# Patient Record
Sex: Female | Born: 1955 | Race: White | Hispanic: No | Marital: Married | Smoking: Former smoker
Health system: Western US, Academic
[De-identification: ages and names within clinical notes are randomized; demographics above are authoritative.]

## PROBLEM LIST (undated history)

## (undated) DIAGNOSIS — M19012 Primary osteoarthritis, left shoulder: Secondary | ICD-10-CM

## (undated) DIAGNOSIS — M75100 Unspecified rotator cuff tear or rupture of unspecified shoulder, not specified as traumatic: Secondary | ICD-10-CM

## (undated) DIAGNOSIS — C44311 Basal cell carcinoma of skin of nose: Secondary | ICD-10-CM

## (undated) DIAGNOSIS — R7989 Other specified abnormal findings of blood chemistry: Principal | ICD-10-CM

## (undated) DIAGNOSIS — F3341 Major depressive disorder, recurrent, in partial remission: Secondary | ICD-10-CM

## (undated) DIAGNOSIS — J45909 Unspecified asthma, uncomplicated: Secondary | ICD-10-CM

## (undated) DIAGNOSIS — F32A Depression, unspecified: Secondary | ICD-10-CM

## (undated) DIAGNOSIS — E782 Mixed hyperlipidemia: Secondary | ICD-10-CM

## (undated) DIAGNOSIS — M8580 Other specified disorders of bone density and structure, unspecified site: Secondary | ICD-10-CM

## (undated) DIAGNOSIS — F329 Major depressive disorder, single episode, unspecified: Secondary | ICD-10-CM

## (undated) HISTORY — PX: PR UNLISTED PROCEDURE HANDS/FINGERS: 26989

## (undated) HISTORY — DX: Major depressive disorder, recurrent, in partial remission: F33.41

## (undated) HISTORY — DX: Primary osteoarthritis, left shoulder: M19.012

## (undated) HISTORY — DX: Basal cell carcinoma of skin of nose: C44.311

## (undated) HISTORY — PX: PR VAGINAL HYSTERECTOMY UTERUS 250 GM/<: 58260

## (undated) HISTORY — DX: Mixed hyperlipidemia: E78.2

## (undated) HISTORY — DX: Unspecified rotator cuff tear or rupture of unspecified shoulder, not specified as traumatic: M75.100

## (undated) HISTORY — DX: Unspecified asthma, uncomplicated: J45.909

## (undated) HISTORY — DX: Depression, unspecified: F32.A

## (undated) HISTORY — DX: Major depressive disorder, single episode, unspecified: F32.9

## (undated) HISTORY — PX: INJECTION, STEROID: SHX000882

## (undated) HISTORY — PX: TONSILLECTOMY: SHX001830

## (undated) HISTORY — DX: Other specified abnormal findings of blood chemistry: R79.89

## (undated) HISTORY — DX: Other specified disorders of bone density and structure, unspecified site: M85.80

---

## 2004-02-29 HISTORY — PX: PR COLONOSCOPY FLX DX W/COLLJ SPEC WHEN PFRMD: 45378

## 2015-02-04 ENCOUNTER — Ambulatory Visit: Admit: 2015-02-04 | Discharge: 2015-02-04 | Disposition: A | Discharge status: Home / Self Care | Payer: Self-pay

## 2015-12-02 ENCOUNTER — Ambulatory Visit: Admit: 2015-12-02 | Discharge: 2015-12-02 | Disposition: A | Discharge status: Home / Self Care | Payer: Self-pay

## 2016-02-01 DIAGNOSIS — G548 Other nerve root and plexus disorders: Secondary | ICD-10-CM

## 2016-02-01 DIAGNOSIS — G96191 Perineural cyst: Secondary | ICD-10-CM | POA: Insufficient documentation

## 2016-02-11 ENCOUNTER — Encounter: Payer: Self-pay | Admitting: ANESTHESIOLOGISTS

## 2016-02-11 ENCOUNTER — Ambulatory Visit (HOSPITAL_BASED_OUTPATIENT_CLINIC_OR_DEPARTMENT_OTHER): Payer: Enrolled Prime—HMO | Admitting: ANESTHESIOLOGISTS

## 2016-02-11 ENCOUNTER — Ambulatory Visit
Admission: RE | Admit: 2016-02-11 | Discharge: 2016-02-11 | Disposition: A | Discharge status: Home / Self Care | Payer: Enrolled Prime—HMO | Source: Ambulatory Visit | Attending: ANESTHESIOLOGISTS | Admitting: ANESTHESIOLOGISTS

## 2016-02-11 VITALS — BP 113/74 | HR 86 | Temp 98.3°F | Resp 16 | Ht 63.0 in | Wt 159.2 lb

## 2016-02-11 DIAGNOSIS — M48061 Spinal stenosis, lumbar region without neurogenic claudication: Secondary | ICD-10-CM | POA: Insufficient documentation

## 2016-02-11 DIAGNOSIS — M4316 Spondylolisthesis, lumbar region: Secondary | ICD-10-CM | POA: Insufficient documentation

## 2016-02-11 DIAGNOSIS — M533 Sacrococcygeal disorders, not elsewhere classified: Secondary | ICD-10-CM

## 2016-02-11 DIAGNOSIS — G4700 Insomnia, unspecified: Secondary | ICD-10-CM | POA: Insufficient documentation

## 2016-02-11 DIAGNOSIS — M5136 Other intervertebral disc degeneration, lumbar region: Secondary | ICD-10-CM

## 2016-02-11 DIAGNOSIS — M1811 Unilateral primary osteoarthritis of first carpometacarpal joint, right hand: Secondary | ICD-10-CM | POA: Insufficient documentation

## 2016-02-11 DIAGNOSIS — J45909 Unspecified asthma, uncomplicated: Secondary | ICD-10-CM | POA: Insufficient documentation

## 2016-02-11 DIAGNOSIS — M5416 Radiculopathy, lumbar region: Secondary | ICD-10-CM | POA: Insufficient documentation

## 2016-02-11 DIAGNOSIS — G43909 Migraine, unspecified, not intractable, without status migrainosus: Secondary | ICD-10-CM | POA: Insufficient documentation

## 2016-02-11 DIAGNOSIS — M1288 Other specific arthropathies, not elsewhere classified, other specified site: Secondary | ICD-10-CM

## 2016-02-11 DIAGNOSIS — E785 Hyperlipidemia, unspecified: Secondary | ICD-10-CM | POA: Insufficient documentation

## 2016-02-11 HISTORY — DX: Unspecified asthma, uncomplicated: J45.909

## 2016-02-11 NOTE — Patient Instructions (Signed)
PAIN MANAGEMENT CLINIC  AFTER VISIT INSTRUCTIONS       [facility] [phone] or [phone]: available during business hours, 8 am - 5 pm  On-call physician [phone] THIS IS A NUMERICAL PAGER -DO NOT [facility].  Physician is available after business hours, 7 days per week including holidays.      Today you attended a new patient consult or follow up appointment    Your next appointment to schedule is a procedure  VERY IMPORTANT  INSTRUCTIONS FOR YOUR FOLLOW UP APPOINTMENT  Please remember to arrive 15 minutes prior to your appointment time.  This will allow time for check in.  If you arrive after your scheduled appointment time your appointment could be rescheduled or your visit with the physician will be shortened.      NOTE:  If your Pain Physician ordered a procedure, lab, diagnostic study (x-ray, MRI, CT, ultrasound, EMG, EKG, etc.), or specialty referral for you today, please be aware that some insurance carriers may require authorization before they can be scheduled. If these cannot be scheduled at the time of discharge today, you will be contacted with the scheduled appointment.  If you are not contacted within 3-5 business days, please call our office at [phone].          Procedure To Schedule FOR YOUR Next Visit: Epidural Steroid - Caudal and Epidural Steroid -VS Lumbar  Fluoroscopy yes      PLEASE NOTIFY STAFF IF YOU HAVE A PACEMAKER, AICD, SPINAL CORD STIMULATOR OR OTHER IMPLANTABLE DEVICE BEFORE YOUR NEXT APPOINTMENT.      NOTE: Please be aware of which location you are scheduled for your next appointment. The  [facility] has 2 locations in [place]:   (1) the [facility] at the [facility] and   (2) the [place] at the [employer] [place].   If you have any questions regarding the location of  your next appointment, please call our office: [phone].            PLEASE FOLLOW ALL INSTRUCTIONS CAREFULLY  Instructions and medication holds may verify by procedure.  Please call to confirm the instructions specific to your procedure, [phone].        If you are SICK, it is not safe to do your procedure. Therefore, please call us as soon as possible to reschedule. Since we do keep a waiting list, your courtesy will allow [place] to schedule another patient. Please, call our office if you have any questions.    If you are NOT IN PAIN OR HAVE MINIMAL PAIN, please call to postpone your procedure (unless otherwise instructed by your Pain Physician).          MEDICATIONS    If your Pain Physician prescribes medications for you, please anticipate when you will require a refill.  It can take 1-3 business days to complete the process. Contact your pharmacy for medication refills. Your pharmacy will contact the Pain Clinic office with detailed medication information.         Driving When You Are Taking Medications    For most people, driving represents freedom, control and independence. Driving enables most people to get to the places they want or need to go. For many people, driving is important economically - some drive as part of their job or to get to and from work.   Driving is a complex skill. Our ability  to drive or operate heavy or dangerous machinery safely can be affected by changes in our physical, emotional and mental condition. The goal of this brochure is to help you and your health care professional talk about how your medications may affect your ability to drive safely.     How can medications affect my driving or ability to operate heavy or dangerous machinery?   People take medications for a variety of reasons. Those can include:          allergies          anxiety          colds          depression          diabetes          heart  and cholesterol conditions          high blood pressure          muscle spasms          pain          Parkinson's disease          schizophrenia   Medications may be prescribed by your doctor or purchased over-the-counter without a doctor's prescription. Many individuals also take herbal supplements. Some of these medications and supplements may cause a variety of reactions that may make it more difficult for you to drive a car or operate heavy or dangerous machinery safely. These reactions may include:          sleepiness          blurred vision          dizziness          slowed movement          fainting          inability to focus or pay attention          nausea   Often people take more than one medication at a time. The combination of different medications can cause problems for some people. This is especially true for older adults because they take more medications than any other age group. Due to changes in the body as people age, older adults are more prone to medication related problems. The more medications you take, the greater your risk that your medicines will affect your ability to drive safely. To help avoid problems, it is important that at least once a year you talk to your doctor or pharmacist about all the medications - both prescription and over-the-counter - you are taking. Also let your professional know what herbal supplements, if any, you are taking. Do this even if your medications and supplements are not currently causing you a problem.     Can I still drive or operate heavy or dangerous machinery safely if I am taking medications?     Yes, most people can drive or operate heavy or dangerous machinery safely if they are taking medications. It depends on the effect those medications - both prescription and over-the-counter - have on your coordination and mental function. In some cases you may not be aware of the effects. But, in many instances, your doctor can help to minimize the negative  impact of your medications on your driving or operate heavy or dangerous machinery in several ways. Your doctor may be able to:          adjust the dose;          adjust the timing of   doses or when you take the medication;          add an exercise or nutrition program to lessen the need for medication; and          change medication to one that causes less drowsiness.     What can I do if I am taking medications?   Talk to your doctor honestly.   When your doctor prescribes a medicine for you, ask about side effects. How should you expect the medicine to affect your ability to drive or operate heavy or dangerous machinery? Remind your doctor of other medications - both prescription and over-the-counter - and herbal supplements you are taking, especially if you see more than one doctor. Talking honestly with your doctor also means telling the doctor if you are not taking all or any of the prescribed medication. Do not stop taking your medication unless your doctor tells you to.   Ask your doctor if you should drive - especially when you first take a medication.   Taking a new medication can cause you to react in a number of ways. It is recommended that you do not drive or operate heavy or dangerous machinery when you first start taking a new medication until you know how that drug affects you. You also need to be aware that some over-the-counter medicines and herbal supplements can make it difficult for you to drive or operate heavy or dangerous machinery safely.     Talk to your pharmacist.   Get to know your pharmacist. Ask the pharmacist to go over your medications with you and to remind you of effects they may have on your ability to drive or operate heavy or dangerous machinery safely. Be sure to request printed information about the side effects of any new medication. Remind your pharmacist of other medicines and herbal supplements you are taking. Pharmacists are available to answer questions wherever you get  your medications. Many people buy medicines by mail. Mail-order pharmacies have a toll-free number you can call and a pharmacist available to answer your questions about medications.   Monitor yourself.   Learn to know how your body reacts to the medications and supplements. Keep track of how you feel after you take the medication. For example, do you feel sleepy? Is your vision blurry? Do you feel weak and slow? When do these things happen?   Let your doctor and pharmacist know what is happening.   No matter what your reaction is to taking a medicine - good or bad - tell your doctor and pharmacist. Both prescription and over-the-counter medications are powerful-that's why they work. Each person is unique. Two people may respond differently to the same medicine. If you are experiencing side effects, the doctor needs to know that in order to adjust your medication. Your doctor can help you find medications that work best for you.     What if I have to cut back or give up driving?   You can keep your independence even if you have to cut back or give up on your driving due to your need to take medications. It may take planning ahead on your part, but it will get you to the places you want to go and the people you want to see. Consider:          rides with family and friends;          taxi cabs;          shuttle buses   or vans;          public buses, trains and subways; and          walking.   Also, senior centers and religious and other local service groups often offer transportation services for older adults in the community.     Thank you for choosing [facility].

## 2016-02-11 NOTE — Progress Notes (Addendum)
[facility]  Department of Anesthesiology and Pain Medicine  [address], [address]  [place], [place] [address]  Phone: [phone]    Fax: [phone]    Date: February 11, 2016    Re: Erica Hancock  MR#: [id number]  Date of Birth: 10/19/1955  Age: 21yr    Requesting physician: Erica Messing, MD    Below is the summary of the visit with our mutual patient, Erica Hancock. Thank you for your cooperation in her care.      Assessment and Differential Diagnosis:    ICD-10-CM    1. Spondylolisthesis of lumbar region M43.16 L-SPINE 4+ VIEWS   2. Lumbar radicular pain M54.16    3. Sacro-iliac pain M53.3         Orders Placed This Encounter    L-Spine 4+ Views    Amitriptyline (ELAVIL) 50 mg Tablet             Encounter Summary:  Erica Hancock is a 60yr -old female who has a past medical history of depression and chronic axial back pain  and was seen today in consultation at the [facility] for low back pain.    Erica Hancock states that she has had axial back pain for 25 years. The pain in the low back is located primary in the right low back. Over the last 6-7 months, the patient has had radiating pain into the right lower extremity. She states the radiating pain is in the buttocks, radiating into the posterior thigh and into the lateral leg and achilles.     She had been involved in physical therapy 7-8 years ago. She was given a home exercise program which she has continued. She also has recently gotten involved in Yoga and home stretches which helps decrease the pain    In regards to pain medications  -Infrequent use of NSAIDs 800 mg 2 x per week  -Patient was prescribed Norco for her surgery- and does not want to take any opioids secondary to chronic constipation.   -Patient has not tried medications such as gabapentin or cymbalta. She would like to avoid any medications and is interested in more conservative there  -She has tried ICY-Hot for back pain which helps decrease the pain.  -She is also  noted to be on amitriptyline 50 mg at bedtime for depression, but Erica Hancock notes this also helps with her pain.     Of note, she has seen Dr. Efraim Kaufmann, an outside neurosurgeon, for possible surgery. We will need to obtain these records as well.      On physical examination, patient was noted to have 5/5 strength of the lower extremities bilaterally. Her reflexes were 2/2 of the patella and achilles, and had intact and symmetrical sensation to light touch bilaterally. She was noted to have positive Patrick's test and Gaenslen's test on the right, possibly indicating SIJ pathology as a secondary pain generator.     Our primary diagnosis today is Lumbar Radicular Pain. Our plan is to proceed with a Lumbar Epidural Steroid Injection vs a caudal. Although we have reports of the most recent MRI, we do not have the images available to [place]. We have asked the patient to obtain these images on a CD for [place], so that we may decide on the most appropriate procedure. It was noted on the MRI report that the patient had a Grade 1 L4 on L5 Spondylolistheses. We will have the patient obtain  a lumbar flexion/extension study, to rule out any significant instability. Lastly, as noted above, patient likely has a secondary pain generator from the right sacro iliac joint.. We will first proceed with a epidural steroid injection, and can consider a right sacro iliac joint injection if needed in the future.        RECOMMENDATIONS/TREATMENT PLAN:   1. Spondylolisthesis of lumbar region  - L-Spine 4+ Views- To rule out any instability of the Lumbar Spine    2. Lumbar radicular pain  -Next available LES vs Caudal. Dependent on MRI imaging which patient was instructed to bring on a disc so we may view the images ourselves    3. Sacro-iliac pain  -Likely a secondary pain generator based on physical exam. Will first recommend a epidural steroid injection, and can consider a right side SIJ in the future as needed.       DISPOSITION AFTER TODAY'S  VISIT: We will plan to have Erica Hancock follow up at the next available appointment for Caudal vs Lumbar Epidural Steroid Injection Depending on most recent MRI imaging which patient will bring              Dear Dr. Autumn Messing, MD    It was a pleasure to see your patient, Erica Hancock, today in consultation at the [facility] [place].  As you know, Erica Hancock is a 60yr -old female with a chief complaint of:    Chief Complaint   Patient presents with    Back Pain    Leg Pain       Erica Hancock is here for chronic complaint of right sided low back pain with radiation into the right lower extremity.     She notes pain located in the areas indicated in the pain location diagram below.     Pain Location Diagram        ALLERGIES:  Allergies   Allergen Reactions    Lidocaine Other-Reaction in Comments     Heart palpations    Sulfa (Sulfonamide Antibiotics) Anaphylaxis       CURRENT MEDICATIONS:     Outpatient Prescriptions Marked as Taking for the 02/11/16 encounter (Office Visit) with Amparo Bristol, MD   Medication Sig Dispense Refill    Amitriptyline (ELAVIL) 50 mg Tablet Take 50 mg by mouth every day at bedtime. Indications: Depression         STOPPED PAIN MEDICATIONS:   None    OPIOID MEDICATIONS  [name] was asked if they are taking opioid medications such as hydrocodone (Vicodin, Norco, Lortab, Zohydro), hydromorphone (Dilaudid, Exalgo), fentanyl (Duragesic, Actiq, Fentora, Subsys), morphine (MS Contin, Kadian, Avinza), oxycodone (Percocet, Roxycodone, Oxycontin), methadone, buprenorphine (Suboxone, Subutex, BuTrans), oxymorphone (Opana), codeine (Tylenol #3 or #4), tramadol (Ultram), or tapentadol (Nucynta)               [name] answers no to current opioid use.    TIMING OF PAIN  Erica Hancock states that the pain is present constantly (100% of the time)    PAIN QUALITY   Erica Hancock describes the pain as sharp,  dull, aching, throbbing and pressure    PAIN INTENSITY - Verbal Analog Scale (VAS): 0-10 Scale (0 = No Pain, 10 = Worst Imaginable Pain)    Currently, Erica Hancock has a VAS score of 5 / 10   On average, Erica Hancock pain score for the last week has been a VAS of 4 / 10   At its  best, Erica Hancock VAS pain score is 3 / 10   At its worst, Erica Hancock VAS pain score is 5 / 10     RELIEVING AND AGGRAVATING FACTORS  Erica Hancock was asked if any of the following activities either alleviates or exacerbates her pain: lying down, standing, sitting walking, exercise, medications, relaxation, thinking about something else, coughing/sneezing, urination, or bowel movements    Erica Hancock states her pain:   is relieved by: lying down, walking, medication     is aggravated by: standing    FUNCTIONAL ASSESSMENT (Pain Disability Index):   Measures the level of disability related to pain: 0-10 Scale (0= No Disability, 2=Minimal, 5=Moderate, 8=Severe, 10= Total Disability)    Family and home responsibilities: activities related to home and family 8   Recreation: hobbies sports and other leisure time activities 7   Social activity: participation with friends and acquaintances other than family members 7   Occupation: activities partly or directly related to working, including housework or volunteering 7   Sexual behavior: frequency and quality of sex life 7   Self-care: personal maintenance and independent daily living (bathing, dressing etc.) 8   Life-support activity: basic life-supporting behaviors (eating, sleeping, breathing, etc.) 6     REVIEW OF SYSTEMS:  Ms. Stjulian was asked to review the list below and report if she was currently experiencing any of the symptoms:     Constitutional  o fever or chills   o unplanned weight loss    Eyes  o double or blurred vision    ENT  o hearing loss   o difficulty swallowing    Hematologic/Lymphatic  o bleeding gums   o low platelet count    Endocrine  o heat intolerance   o cold intolerance    o thyroid problems    Integumentary  o skin rash    Respiratory  o shortness of breath   o wheezing    Cardiac  o palpitations (awareness of fast heart)   o chest pain    Gastrointestinal  o constipation   o abdominal pain   o nausea  o vomiting   o diarrhea    Genito-urinary  o sexual dysfunction   o urinary retention or difficulty urinating    Musculoskeletal  o back pain   o neck pain  o joint pain   o muscle pain    Neurological  o loss of consciousness or blackouts   o memory loss   o muscle weakness   o seizures   o trouble walking   o dizziness   o drowsiness    o excessive fatigue    Behavioral  o difficulty falling or remaining asleep   o loss of interest in hobbies or other activities   o difficulty concentrating   o feelings of guilt   o feeling depressed     Ms. Lamberton ADMITS TO THE FOLLOWING OF THE ABOVE LISTED SYMPTOMS:  cold intolerance and back pain    All other systems were negative.    The Review of Systems was reviewed.    PAST MEDICAL, FAMILY, SOCIAL HISTORY:     Family Life: Living Circumstances:    Erica Hancock currently lives with their spouse        Past Medical History:   Diagnosis Date    Depression        Past Surgical History:   Procedure Laterality Date    INJECTION, STEROID      Right thumb  TONSILLECTOMY         Family History   Problem Relation Age of Onset    Hypertension Mother     Heart Disease Mother     Blood Disease Father          PSYCHOSOCIAL HISTORY:     Social History     Social History    Marital status: MARRIED     Spouse name: N/A    Number of children: N/A    Years of education: N/A     Occupational History    self employed      Aeronautical engineer     Social History Main Topics    Smoking status: Former Smoker     Packs/day: 1.00     Years: 12.00     Types: Cigarettes     Quit date: 03/01/1979    Smokeless tobacco: Never Used    Alcohol use 3.6 oz/week     6 Glasses of wine per week    Drug use: No    Sexual activity: Not Asked     Other  Topics Concern    None     Social History Narrative    None     The patient  reports that she quit smoking about 36 years ago. Her smoking use included Cigarettes. She has a 12.00 pack-year smoking history. She has never used smokeless tobacco.    Ms. Writt past medical, family, and social history were reviewed.    PROBLEM LIST:  There is no problem list on file for this patient.      PAIN TREATMENTS:   Previous Injection Procedures: Ms. Bengtson did not undergo a recent procedure.Marland Kitchen NA    Prior Physical Therapy: The patient HAS been previously involved in physical therapy.  The patient states that physical therapy has been helpful.    PREVIOUS DIAGNOSTIC STUDIES:   Images: The report of the image indicated below was reviewed.   MRI of lumbar spine.         Labs: The lab/study results below were reviewed.          Urine Toxicology  No results found for: DRUGGCSCRNU  No results found for: AMPHETSCRNU  No results found for: BENZOSSCRNU  No results found for: COCAINESCRNU  No results found for: BARBSSCRNU    OTHER PAIN PROBLEMS:  The patient does NOT report pain in other areas.    LEGAL ISSUES:    Ms. Cavitt is not currently involved in litigation pertaining to the pain complaint.     She does not have a prior history of arrests or legal problems.     She has not filed a Clinical research associate related to the pain complaint.    PSYCHOLOGICAL HISTORY AND TREATMENTS:      Ms. Kaster has not been evaluated or treated by a psychiatrist, psychologist, or counselor.      DEPRESSION SCALE (Patient Health Questionnaire PHQ-9)  1. Little interest or pleasure in doing things: 1  2. Feeling down, depressed or hopeless: 1  3. Trouble falling or staying asleep, or sleeping too much: 0  4. Feeling tired or having little energy: 0  5. Poor appetite or overeating: 1  6. Feeling bad about yourself - or that you are a failure or have let yourself or your family down: 1  7. Trouble concentrating on things such as reading the  paper or watching television: 0  8. Moving or speaking so slowly that other people could have noticed?  Or the opposite - being so figity or restless that you have been moving around more than usual: 1  9. Thoughts that you would be better off dead or of hurting yourself in some way: 0  If >  1: Please notify a mental health provider or the patients primary care provider with any concerns about acute risk for suicide.   10. If you checked off any problems, how difficult have these problems made it for you to do your work, take care of things at home, or get along with other people?: Somewhat difficult  PHQ-9 Score: 5     PHQ9 Affective-Cognitive Total: 3/15  PHQ9 Physical Total: 2/12        Total Score Depression Severity   1-4 Minimal Depression   5-9 Mild Depression   10-14 Moderate Depression   15-19 Moderately Severe Depression   20-27 Severe Depression          She does not have a past history of suicide attempts.      Ms. Turay's psychological, depression, and suicidality history were reviewed.  The patient appears to be at low acute risk for suicide or self-harm at this time.     EFFECT OF PAIN ON EMPLOYMENT   Ms. Bowhay employment status (see below) has  been affected by the present pain condition.     She is not currently unemployed due to the painful condition.      PHYSICAL EXAM:    Vitals:    02/11/16 1428   BP: 113/74   Pulse: 86   Resp: 16   Temp: 36.8 C (98.3 F)   TempSrc: Oral   SpO2: 97%   Weight: 72.2 kg (159 lb 2.8 oz)   Height: 1.6 m (5\' 3" )     Body mass index is 28.2 kg/(m^2).   Constitutional: Normally developed, no deformities,well groomed.  Skin:  Skin color, texture, turgor normal. No rashes or lesions.  Eyes:  conjunctivae and sclera normal.  Ears:  external inspection of ears show no abnormality.  Nose:  normal.  Mouth: normal.  Respiratory: clear to auscultation.  Cardiovascular:  normal rate and regular rhythm, no murmurs, clicks, or gallops.  GI: soft,  non-tender.  Musculoskeletal: Gait/Station is not antalgic, Patient is able to heel walk and is able to toe walk  Neuro: Movement Assessment Lower limb  Bulk:  normal  Tone:   normal  Abnormal Movements:  no    Strength Movement Root Nerve Muscle   Bilateral 5 Hip flexion L1/2/3 Femoral and Spinal N. Iliopsoas   Bilateral 5 Hip adduction L2/3/4 Obturator Adductors   Bilateral 5 Knee flexion L5/S1/S2 Sciatic Hamstrings   Bilateral 5 Knee extension L2/3/4 Femoral Quadriceps   Bilateral 5 Ankle dorsiflexion L4/5 Deep peroneal Tibialis anterior   Bilateral 5 Ankle inversion L4/5 Tibial Nerve Tibialis Posterior   Bilateral 5 Ankle eversion L5/S1 Superficial peroneal Peronei   Bilateral 5 Ankle plantarflexion S1/S2 Tibial Gastrocnemius, soleus   Bilateral 5 Big toe extension L5/S1 Deep peroneal Extensor hallucis longus     Lower extremity reflexes: patella - bilateral 2+, achilles - bilateral 2+  Babinski- Toes are downgoing bilateral   Ankle clonus is not present bilateral    Psych:Appearance/Cooperation: in no apparent distress  Attitude: pleasant   Behavior :normal  Eye Contact: normal  Attention Span: good  Speech: normal volume, rate, and pitch  Mood (pt's report) :Mood pt's report, euthymic  Thought Process: logical and goal directed  Thought Content: no thought disorder or psychotic  symptoms  Lumbar Spine:range of motion is full with flexion and is associated with no change in pain.  range of motion is full with extension and is associated with increase in pain.  range of motion is mildly restricted with oblique extension and is associated with increase in pain.  Seated SLR is negative bilaterally.  Patrick's test causes pain over the SI joint right.  Gaenslen's causes pain over the SI joint right.    MEDICAL DECISION MAKING     Records Reviewed:   Images viewed/reviewed above were important and necessary because subsequent medical and treatment recommendations required review of the above image(s)  The report by  Dr. Renne Crigler dated 12/14/2015 was reviewed.  This was important and useful because the reviewed note clarified the reasons for the recommended treatment and the patient's history was corroborated in the reviewed note.     Review of the Risk of Comorbidities:  At this juncture, we believe that the patient's constitutional status adds no risk and complexity to our proposed evaluation and treatment.  Medium Risk procedures ordered:  Lumbar Injections     ASSESSMENT AND RECOMMENDATIONS/TREATMENT PLAN:   Please see the beginning of the note.       The patient was instructed and educated on all aspects of the plan of care.  The patient acknowledged the plan of care.  I saw and evaluated the patient with my attending physician, Dr. Carlynn Herald, DO  Fellow  Department of Anesthesiology & Pain Medicine    Note Electronically Signed By:  Harriett Rush, DO

## 2016-02-11 NOTE — Progress Notes (Signed)
Patient name and DOB verified. Patient roomed and vital signs obtained. Medication allergies and pain level verified. Tobacco use history reviewed.

## 2016-02-12 NOTE — Progress Notes (Signed)
Left message for patient to call back  

## 2016-02-14 NOTE — Progress Notes (Signed)
I (Aleksander Edmiston, MD) evaluated and examined the patient with the pain fellow on the date indicated in the fellow's note.  I discussed the case with the Fellow, Dr. Dyara and agree with the findings.  We developed the plan together as outlined in the pain fellow's note.       The patient was instructed and educated on all aspects of the plan of care. The patient acknowledged the plan of care.    Apurva Reily Gregory Calen Geister, MD  Attending Physician  Department of Anesthesiology & Pain Medicine    Note Electronically Signed By:  Jabier Deese G. Aniko Finnigan, MD  Professor of Anesthesiology and Pain Medicine  Division of Pain Medicine

## 2016-02-15 ENCOUNTER — Telehealth: Payer: Self-pay

## 2016-02-15 NOTE — Progress Notes (Signed)
lmcb with pcp office to submit auth req to [employer]

## 2016-02-15 NOTE — Telephone Encounter (Signed)
LMTCB regarding flexion/extension studies.

## 2016-03-01 NOTE — Telephone Encounter (Signed)
MRI lumbar spine report scanned in under media dated today 03/01/16. Also imaging is uploaded into outside images.    Thank you  [facility] Patient Referrals  [place] [facility]  [phone]  [phone]

## 2016-03-03 ENCOUNTER — Encounter: Payer: Self-pay | Admitting: ANESTHESIOLOGISTS

## 2016-03-23 ENCOUNTER — Ambulatory Visit: Admit: 2016-03-23 | Discharge: 2016-03-23 | Disposition: A | Discharge status: Home / Self Care | Payer: Self-pay

## 2016-03-23 ENCOUNTER — Other Ambulatory Visit: Payer: Self-pay | Admitting: Family Medicine

## 2016-03-23 ENCOUNTER — Ambulatory Visit
Admission: RE | Admit: 2016-03-23 | Discharge: 2016-03-23 | Disposition: A | Discharge status: Home / Self Care | Source: Ambulatory Visit | Attending: Family Medicine | Admitting: Family Medicine

## 2016-03-23 DIAGNOSIS — Z1231 Encounter for screening mammogram for malignant neoplasm of breast: Principal | ICD-10-CM

## 2016-03-27 ENCOUNTER — Encounter: Payer: Self-pay | Admitting: Physician Assistant

## 2016-03-27 ENCOUNTER — Ambulatory Visit: Admit: 2016-03-27 | Discharge: 2016-04-01 | Disposition: A | Discharge status: Home / Self Care | Payer: Self-pay

## 2016-03-27 ENCOUNTER — Emergency Department

## 2016-03-27 ENCOUNTER — Ambulatory Visit: Admit: 2016-03-27 | Discharge: 2016-03-27 | Disposition: A | Discharge status: Home / Self Care | Payer: Self-pay

## 2016-03-27 ENCOUNTER — Emergency Department
Admission: RE | Admit: 2016-03-27 | Discharge: 2016-03-27 | Disposition: A | Discharge status: Home / Self Care | Attending: Physician Assistant | Admitting: Physician Assistant

## 2016-03-27 DIAGNOSIS — Z87891 Personal history of nicotine dependence: Secondary | ICD-10-CM

## 2016-03-27 DIAGNOSIS — F329 Major depressive disorder, single episode, unspecified: Secondary | ICD-10-CM

## 2016-03-27 DIAGNOSIS — M7532 Calcific tendinitis of left shoulder: Secondary | ICD-10-CM | POA: Insufficient documentation

## 2016-03-27 DIAGNOSIS — M25512 Pain in left shoulder: Secondary | ICD-10-CM

## 2016-03-27 MED ORDER — HYDROCODONE 5 MG-ACETAMINOPHEN 325 MG TABLET
1.0000 | ORAL_TABLET | Freq: Four times a day (QID) | ORAL | 0 refills | Status: AC | PRN
Start: 2016-03-27 — End: 2016-03-30

## 2016-03-27 MED ORDER — MELOXICAM 7.5 MG TABLET
7.5000 mg | ORAL_TABLET | Freq: Every day | ORAL | 0 refills | Status: DC
Start: 2016-03-27 — End: 2016-04-12

## 2016-03-27 MED ORDER — KETOROLAC 30 MG/ML (1 ML) INJECTION SOLUTION
30.0000 mg | Freq: Once | INTRAMUSCULAR | Status: AC
Start: 2016-03-27 — End: 2016-03-27
  Administered 2016-03-27: 30 mg via INTRAMUSCULAR
  Filled 2016-03-27: qty 1

## 2016-03-27 NOTE — ED Initial Note (Signed)
EMERGENCY DEPARTMENT PHYSICIAN NOTE - Erica Hancock       Date of Service:   03/27/2016 12:28 PM Patient's PCP: Rhea Belton Camisa   Note Started: 03/27/2016 13:42 DOB: 07-21-1955           No chief complaint on file.          The history provided by the patient.  Interpreter used: No    Erica Hancock is a 61yr old female, with a past medical history significant for previous L Frozen shoulder and injury 5 years ago, who presents to the ED with a chief complaint of L shoulder pain that began yesterday upon waking. Pt with sig dec ROM.     Quality: sharp  Location: L shoulder  Severity: mod-severe/10  Time Course: constant  Progression: unchanged  Duration: 2 days  Palliative factors: nothing makes it better.  Provocative factors: nothing makes it worse.  Associated symptoms: none  Pertinent negatives: no n/t/weakness    A full history, including pertinent past medical and social history was reviewed and updated as necessary.Marland Kitchen    HISTORY:  There are no active hospital problems to display for this patient.   Allergies   Allergen Reactions    Epinephrine Tachycardia    Lidocaine Other-Reaction in Comments     Heart palpations    Sulfa (Sulfonamide Antibiotics) Anaphylaxis      Past Medical History:  No date: Depression Past Surgical History:  No date: Injection, steroid  No date: Tonsillectomy   Social History    Marital status: MARRIED             Spouse name:                       Years of education:                 Number of children:               Occupational History  Occupation          Fish farm manager            Comment               self employed                           Sales promotion account executive    Social History Main Topics    Smoking status: Former Smoker                                                                Packs/day: 1.00      Years: 12.00          Types: Cigarettes       Quit date: 03/01/1979    Smokeless status: Never Used                        Alcohol  use: Yes  3.6 oz/week       6 Glasses of wine per week    Drug use: No              Sexual activity: Not on file          Other Topics            Concern    None on file    Social History Narrative    None on file     Review of patient's family history indicates:    Hypertension                   Mother                    Heart Disease                  Mother                    Blood Disease                  Father                             Review of Systems   Constitutional: Negative for activity change, chills, fatigue, fever and unexpected weight change.   HENT: Negative for congestion, dental problem, ear pain, facial swelling, mouth sores, postnasal drip, rhinorrhea, sinus pressure, sore throat and trouble swallowing.    Eyes: Negative for photophobia, pain, discharge, redness and visual disturbance.   Respiratory: Negative for cough, shortness of breath and wheezing.    Cardiovascular: Negative for chest pain, palpitations and leg swelling.   Gastrointestinal: Negative for abdominal pain, diarrhea, nausea and vomiting.   Endocrine: Negative for polydipsia and polyuria.   Genitourinary: Negative for decreased urine volume, dysuria, flank pain and hematuria.   Musculoskeletal: Positive for arthralgias. Negative for back pain, joint swelling, myalgias and neck pain.   Skin: Negative for rash.   Allergic/Immunologic: Negative for environmental allergies and food allergies.   Neurological: Negative for light-headedness, numbness and headaches.   Hematological: Negative for adenopathy.   Psychiatric/Behavioral: Negative for behavioral problems, confusion, hallucinations and self-injury. The patient is not nervous/anxious.        TRIAGE VITAL SIGNS:  Temp: 36.6 C (97.8 F) (03/27/16 1226)  Temp src: Temporal (03/27/16 1226)  Pulse: 76 (03/27/16 1226)  BP: 142/82 (03/27/16 1226)  Resp: 18 (03/27/16 1226)  SpO2: 95 % (03/27/16 1226)  Weight: 70.3 kg (155 lb) (03/27/16 1226)    Physical Exam    Constitutional: She is oriented to person, place, and time. She appears well-developed and well-nourished.   HENT:   Head: Normocephalic and atraumatic.   Right Ear: External ear normal.   Left Ear: External ear normal.   Nose: Nose normal.   Mouth/Throat: Oropharynx is clear and moist. No oropharyngeal exudate.   Eyes: Conjunctivae and EOM are normal. Pupils are equal, round, and reactive to light. Left eye exhibits no discharge.   Neck: Normal range of motion. Neck supple.   Cardiovascular: Normal rate, regular rhythm, normal heart sounds and intact distal pulses.    Pulmonary/Chest: Effort normal and breath sounds normal. No respiratory distress. She has no wheezes.   Abdominal: Soft. Bowel sounds are normal. There is no tenderness.   Musculoskeletal: She exhibits tenderness (L lat shoulder). She exhibits no edema.        Right shoulder: She  exhibits normal range of motion, no tenderness, no bony tenderness, no swelling, no effusion, no crepitus, no deformity, no laceration, no pain, no spasm, normal pulse and normal strength.        Left shoulder: She exhibits decreased range of motion, tenderness, bony tenderness and pain. She exhibits no swelling, no effusion, no crepitus, no deformity, no laceration, no spasm, normal pulse and normal strength.   Neurological: She is alert and oriented to person, place, and time. She has normal reflexes.   Skin: Skin is warm and dry.   Psychiatric: She has a normal mood and affect. Her behavior is normal. Judgment and thought content normal.   Nursing note and vitals reviewed.        INITIAL ASSESSMENT & PLAN, MEDICAL DECISION MAKING, ED COURSE  Erica Hancock is a 87yr female who presents with a chief complaint of L shoulder pain. Pt awoke with pain yest am. She had injury 5 years ago with subsequent Frozen Shoulder. Pt denies recent injury.     Differential includes, but is not limited to: This differential diagnosis is broad and complex, and includes, but is not limited  to septic arthritis, rheumatoid arthritis, osteoarthritis, gout, pseudogout, lyme disease, gonococcal arthritis, avascular necrosis.      The results of the ED evaluation were notable for the following:    Pertinent imaging results (reviewed and interpreted independently by me): Shoulder 3+ Views Left    Result Date: 03/27/2016  AP in internal and external rotation and transscapular Y views of left shoulder:    Indication: Left shoulder pain without recent trauma history. Comparison: None. Findings: There is no evidence for left shoulder fracture or malalignment. Moderate left acromioclavicular osteoarthritis is present, without significant glenohumeral osteoarthritis. There is a curvilinear 2 cm amorphous calcific density overlying the humeral greater tuberosity, compatible with calcific tendinitis in the rotator cuff. Remainder of left shoulder evaluation is normal.     Impression: 1. Positive for calcific tendinitis in the left rotator cuff. 2. Moderate left acromioclavicular osteoarthritis. 3. Negative for fracture or malalignment.        Chart Review: I reviewed the patient's prior medical records. Pertinent information that is relevant to this encounter reviewed.      Patient Summary: Pt with L shoulder pain, atraumatic. She is found to have Rotator Cuff Calcific Tendinitis. She will f/u with her ortho tomorrow as scheduled. She was given Toradol in the Ed and placed on Meloxicam for pain prn.      LAST VITAL SIGNS:  Temp: 36.6 C (97.8 F) (03/27/16 1226)  Temp src: Temporal (03/27/16 1226)  Pulse: 76 (03/27/16 1226)  BP: 142/82 (03/27/16 1226)  Resp: 18 (03/27/16 1226)  SpO2: 95 % (03/27/16 1226)  Weight: 70.3 kg (155 lb) (03/27/16 1226)      Clinical Impression:     ICD-10-CM    1. Calcific tendinitis of left shoulder M75.32    2. Pain in joint of left shoulder M25.512                  Disposition: Discharge. Follow up with PCP or Hand Surgeon. ED discharge instructions were reviewed and  provided.      PATIENT'S GENERAL CONDITION:  Good: Vital signs are stable and within normal limits. Patient is conscious and comfortable. Indicators are excellent.     Electronically signed by: Rodney Booze

## 2016-03-27 NOTE — ED Nursing Note (Addendum)
Ice pack given to pt for left  shoulder pain

## 2016-03-27 NOTE — Discharge Instructions (Signed)
Thank you for choosing [facility] for your emergency health care needs. It has been our privilege to take care of you today. You have been seen for L shoulder pain and Calcific Tendinitis and discharged home.     Please take all medicines that are prescribed to you as directed. It is crucial, if you have a primary care physician, to follow up with him or her in the time frame recommended as many health conditions that seem self-limited initially may actually worsen over time. If you do not have a primary care physician, we will outline the various resources available for you to find one.     If at any time you feel that your condition is worsening, call your doctor or return to the emergency department for reevaluation.     Please realize that the results of some studies that you had done during your stay with [place] (such as xrays and cultures) are only preliminarily resulted. Results of these studies may change as more information becomes available or as the studies are reevaluated by other members of our health care team in the next few days. We will attempt to contact you with any important changes or additions to the studies that were obtained today.     MEDICATIONS   1. Resume all home medications as directed.   2. Start new discharge medications as directed.   3. Do not drive or operate machinery while taking narcotic pain medications.   4. You may have been prescribed a pain medicine(s) that contains Tylenol (acetaminophen). If you are taking other Tylenol containing medicines at home, be sure NOT to exceed 4 grams (4000 milligrams) of Tylenol per day.

## 2016-03-27 NOTE — ED Triage Note (Addendum)
Left shoulder pain started yesterday morning when she woke up; no recent trauma, however, injured it 5 years ago, painful on movement, has sling applied, took Ibuprofen 600 mg and 1 Vicodin at 1000 today

## 2016-03-29 ENCOUNTER — Telehealth: Payer: Self-pay | Admitting: Family Medicine

## 2016-03-29 NOTE — Telephone Encounter (Signed)
Patient calling in states that she was seen at [facility] ER 03/28/15.   She states she is in acute pain and can not wait until 04/11/2016 to be seen.    She is asking to be seen as soon as possible to address her shoulder pain.   She had a shoulder xray at [facility]. She would like to move forward with an MRI due to the severity of her pain she doesn't believe it is just calcification and tendonitis.   Please review records and advise if we can see patient sooner or start referral process / place MRI order.

## 2016-03-31 NOTE — Telephone Encounter (Signed)
Spoke with patient, unable to come in on 04/05/16, will keep her appointment on 04/11/16 will call office if symptoms worsen.

## 2016-03-31 NOTE — Telephone Encounter (Signed)
Next available slot would be Tuesday 2/6 at 9:40am.  Please check with patient if available.  Thank you.

## 2016-03-31 NOTE — Telephone Encounter (Signed)
Thank you for the message.  Please request records from [facility] ER for my review.  Will assess the situation once available.

## 2016-03-31 NOTE — Telephone Encounter (Signed)
Records and x-ray available for review, see encounter for 03/27/16. Next 27min slot is 04/08/16, please advise.

## 2016-04-11 ENCOUNTER — Ambulatory Visit: Admitting: Family Medicine

## 2016-04-11 ENCOUNTER — Encounter: Payer: Self-pay | Admitting: Family Medicine

## 2016-04-11 VITALS — BP 100/65 | HR 84 | Temp 98.6°F | Resp 16 | Ht 64.0 in | Wt 161.6 lb

## 2016-04-11 DIAGNOSIS — M7532 Calcific tendinitis of left shoulder: Secondary | ICD-10-CM

## 2016-04-11 DIAGNOSIS — M8580 Other specified disorders of bone density and structure, unspecified site: Secondary | ICD-10-CM

## 2016-04-11 DIAGNOSIS — C44311 Basal cell carcinoma of skin of nose: Secondary | ICD-10-CM

## 2016-04-11 DIAGNOSIS — M25512 Pain in left shoulder: Principal | ICD-10-CM

## 2016-04-11 DIAGNOSIS — J45909 Unspecified asthma, uncomplicated: Secondary | ICD-10-CM

## 2016-04-11 DIAGNOSIS — C44319 Basal cell carcinoma of skin of other parts of face: Secondary | ICD-10-CM

## 2016-04-11 DIAGNOSIS — G8929 Other chronic pain: Principal | ICD-10-CM

## 2016-04-11 DIAGNOSIS — F3341 Major depressive disorder, recurrent, in partial remission: Secondary | ICD-10-CM

## 2016-04-11 DIAGNOSIS — Z0000 Encounter for general adult medical examination without abnormal findings: Secondary | ICD-10-CM

## 2016-04-11 DIAGNOSIS — M9983 Other biomechanical lesions of lumbar region: Secondary | ICD-10-CM

## 2016-04-11 DIAGNOSIS — M48061 Spinal stenosis, lumbar region without neurogenic claudication: Secondary | ICD-10-CM

## 2016-04-11 DIAGNOSIS — M4316 Spondylolisthesis, lumbar region: Secondary | ICD-10-CM

## 2016-04-11 NOTE — Nursing Note (Signed)
Patient vitals checked, allergies verified, pharmacy verified and patient screened for pain. By Eathan Groman, MAII

## 2016-04-11 NOTE — Progress Notes (Signed)
SUBJECTIVE:  Erica Hancock is a 61yr old female who is new to Erica [facility]ancock  PCP: Harley Alto, DO    Chief Complaint   Patient presents with    Establish Care    Shoulder Problem     left shoulder, requesting MRI    Sinus Problem      61yr female here to establish care.  Previous PCP at [facility].    Chronic left shoulder pain for past 7 years.  Excruciating pain last few weeks.  Several shots given through [facility] in the past. Diagnosed with calcific tendinitis.    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  Not work-related.  No specific injury.  On-and-off symptoms for years.  X-rays performed.  Requesting further evaluation.  Requesting refill on Mobic.  Improvement in low back pain as well.    Diagnosed with advanced lumbar spinal stenosis.  Injury to neck and upper back as a youth.  Happened during physical education landing on head / neck.  Massages several times per month.  No acupuncture tried.  Worsened with chiropractics in the past.  Established with pain specialist.    History of basal cell carcinoma including 3 Mohs surgeries - one on forehead and two on nose.  Recent significant procedure 1 week ago with twelve sutures placed - site the size of thumbnail.    Diagnosis of osteopenia on DEXA. Currently on Calcium and Vitamin D.  Strong family history of osteoporosis.  History of right trapezium repair in October 2017.  Traumatic injury.  Failed conservative therapy and physical therapy for suspected strain.    Asthma.  Chronic sinus symptoms.  Occasional wheezing.  States she has chronic breathing problems.  Grew up in a room with a cooler which may have had black mold.  Both parents quit smoking in 70s.  Lots of secondhand smoke exposure.  Requesting further evaluation.    Major depression, stable on Elavil.    No suicidal or homicidal ideation.    Due for screening labs.        Social History   Substance Use Topics    Smoking  status: Former Smoker     Packs/day: 1.00     Years: 12.00     Types: Cigarettes     Quit date: 03/01/1979    Smokeless tobacco: Never Used    Alcohol use 3.6 oz/week     6 Glasses of wine per week       Patient Active Problem List   Diagnosis    Migraines    Insomnia    Depression    Arthritis of carpometacarpal (CMC) joint of right thumb    Asthma    HLD (hyperlipidemia)    Skin cancer    Spondylolisthesis of lumbar region    Foraminal stenosis of lumbar region    Lumbar radicular pain    Calcific tendinitis of left shoulder    Pain in joint of left shoulder     Current Outpatient Prescriptions on File Prior to Visit   Medication Sig Dispense Refill    Amitriptyline (ELAVIL) 50 mg Tablet Take 50 mg by mouth every day at bedtime. Indications: Depression      BECLOMETHASONE DIPROPIONATE (QVAR INHA) 2 times daily.      ESTRACE 0.01 % (0.1 mg/gram) Vaginal Cream Insert into the vagina three times a week.      IBUPROFEN (MOTRIN PO) Take 600 mg by mouth if needed.  MISC ELECTROLYTE REPLACEMENT (CALCIUM, [place]++,)       MULTIVITAMIN WITH MINERALS (MULTIVITAMIN & MINERAL FORMULA PO) every day.       No current facility-administered medications on file prior to visit.      BP 100/65  Pulse 84  Temp 37 C (98.6 F) (Temporal)  Resp 16  Ht 1.626 m (5\' 4" )  Wt 73.3 kg (161 lb 9.6 oz)  BMI 27.74 kg/m2    OBJECTIVE:  GENERAL: alert and oriented, well developed, well nourished, pleasant 61 year old female, no acute distress  ENT: moist mucous membranes; nasal mucosa normal; oropharynx clear without inflammation or exudate  HEART: regular rate and rhythm   LUNGS: clear to auscultation bilaterally  ABDOMEN: soft, non-tender, non-distended, normoactive bowel sounds  MUSCULOSKELETAL: moves all extremities, capillary refill <3 seconds, no edema, cyanosis, clubbing  Left shoulder exam shows left side positive impingement signs are present with pain at high arc of abduction.  Internal rotation is painful and  limited.  Empty beercan test positive on the left. Exam for unaffected side is normal.  Negative instability check.   Neurology: DTR's intact.  Vascular: normal circulation to distal extremity. Normal capillary refill.      ASSESSMENT AND PLAN AS DISCUSSED WITH THE PATIENT:      ICD-10-CM    1. Chronic left shoulder pain M25.512 MR UPPER EXTREMITY JOINT WO CONTRAST    G89.29 Meloxicam (MOBIC) 7.5 mg tablet   2. Calcific tendinitis of left shoulder M75.32    3. Spondylolisthesis of lumbar region M43.16    4. Foraminal stenosis of lumbar region M99.83    5. Basal cell carcinoma of skin of nose C44.311    6. Basal cell carcinoma of skin of other part of face C44.319    7. Osteopenia, unspecified location M85.80    8. Asthma, unspecified asthma severity, unspecified whether complicated, unspecified whether persistent J45.909 PULMONARY FUNCTION TEST COMPLETE  (PFT) - PULMONARY LAB     DX CHEST 2 VIEWS   9. Recurrent major depressive disorder, in partial remission F33.41    10. Routine health maintenance Z00.00 CBC WITH DIFFERENTIAL     COMPREHENSIVE METABOLIC PANEL     LIPID PANEL WITH DLDL REFLEX     TSH WITH FREE T4 REFLEX     HEMOGLOBIN A1C     See orders above.  Continue current medication regimen.  Continue Calcium and Vit D.  Chronic left shoulder pain.  Recent diagnosis of calcific tendinitis but concern for rotator cuff tendinitis vs tear.  MRI ordered.  Follow-up with results.  Consider physical therapy vs ortho/sports med referral.  Rx Meloxicam given.  Warned.  Discussed supportive care, ice / heat, home exercises, analgesia as needed.   Chest x-ray and PFTs for further evaluation of asthma.  Routine screening labs ordered.  Follow-up with results.  Will review previous records once available and perform further health maintenance as indicated.    Health Maintenance reviewed - see above.Marland Kitchen  Health Maintenance   Topic Date Due    Tdap  04/15/1965    HIV SCREENING  12/14/1966    PNEUMOCOCCAL VACCINE (1)  12/14/1971    CERVICAL HPV TEST (WITH NEXT PAP)  12/14/1983    PAP-GYN CYTOLOGY  12/14/1983    HEPATITIS C SCREENING  12/14/1983    COLONOSCOPY  12/14/2003    ZOSTER  12/13/2013    INFLUENZA  09/29/2015    MAMMOGRAM  03/23/2018         Discussed care and warning signs with  Ajana and all questions and concerns were fully answered. She will call or followup in the office if any problems arise.     I have reviewed the patient's medical history in detail and updated the computerized patient record.    Barriers to Learning assessed: none.   Patient verbalizes understanding of teaching and instructions.    The patient and/or caregiver was educated regarding his/her medical care.   No guarantees were made regarding his/her medical care or treatment outcome.    I reviewed past medical, family/social and medication history during the visit.     This note was created using the support of [employer] and/or Pharmacist, community. Please note any grammatical, sound-alike, or syntax errors as likely dictation errors.      Electronically signed by:    Otis Peak, DO  Board-Certified, American Board of Family Medicine  Associate Physician in [place]

## 2016-04-12 MED ORDER — MELOXICAM 7.5 MG TABLET
7.5000 mg | ORAL_TABLET | Freq: Every day | ORAL | 3 refills | Status: DC
Start: 2016-04-12 — End: 2016-07-11

## 2016-04-13 ENCOUNTER — Telehealth: Payer: Self-pay | Admitting: Family Medicine

## 2016-04-13 ENCOUNTER — Telehealth: Payer: Self-pay | Admitting: ANESTHESIOLOGISTS

## 2016-04-13 NOTE — Telephone Encounter (Signed)
Yes, Okay to keep her appointment on 04/20/16.

## 2016-04-13 NOTE — Telephone Encounter (Signed)
Dr. 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 Basal cell carcinoma removed 04/05/16 the size of her thumb. Vertical stiches already removed, horizontal stiches will be removed 04/19/16. Will be off her antibiotics for 7 days prior to her appointment (last dose tonight 2/14). Okay to keep her appointment on 04/20/16?     Message reviewed, determined further review/action is needed from Physician.

## 2016-04-13 NOTE — Telephone Encounter (Signed)
Spoke to patient advise her she can keep her appointment for 04/20/16.

## 2016-04-13 NOTE — Telephone Encounter (Signed)
Please call patient and have patient call the radiology film [place] at [phone]. Thank you

## 2016-04-13 NOTE — Telephone Encounter (Signed)
Patient is calling to request her CD of her spine back.  She will be going downtown to pain clinic on 2/21 for an injection.  She can be reached at the above number.  Thank you.

## 2016-04-13 NOTE — Telephone Encounter (Signed)
Dear  Dr. Luis Abed:    [name] was identified by name, date of birth, and mailing address.    The caller is (relationship): self    Phone number (Company Name/Office): [phone]    Pharmacy (location selected): n/a    Reason for Call: Patient was last seen 02/11/16 with Dr. Luis Abed. 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 Patient is completing her last antibiotic of doxycycline tonight. Per patient she had a big chunk of her nose taken out. Is it okay to still keep the appt for 04/20/16?    Action Requested: Please advise    Thank you,    Note Electronically Signed By:  Adelene Amas

## 2016-04-14 ENCOUNTER — Encounter: Payer: Self-pay | Admitting: Family Medicine

## 2016-04-14 DIAGNOSIS — C44311 Basal cell carcinoma of skin of nose: Secondary | ICD-10-CM | POA: Insufficient documentation

## 2016-04-14 DIAGNOSIS — M8580 Other specified disorders of bone density and structure, unspecified site: Secondary | ICD-10-CM | POA: Insufficient documentation

## 2016-04-14 DIAGNOSIS — F3341 Major depressive disorder, recurrent, in partial remission: Secondary | ICD-10-CM | POA: Insufficient documentation

## 2016-04-14 DIAGNOSIS — C4491 Basal cell carcinoma of skin, unspecified: Secondary | ICD-10-CM | POA: Insufficient documentation

## 2016-04-14 HISTORY — DX: Major depressive disorder, recurrent, in partial remission: F33.41

## 2016-04-14 HISTORY — DX: Other specified disorders of bone density and structure, unspecified site: M85.80

## 2016-04-14 HISTORY — DX: Basal cell carcinoma of skin of nose: C44.311

## 2016-04-15 ENCOUNTER — Other Ambulatory Visit: Payer: Self-pay

## 2016-04-19 NOTE — Telephone Encounter (Signed)
Patient came in asking for her Cd,s. Patient was verified and given back 3 Cd's patient, Patient stated she had all her CD's back and that all looked correct

## 2016-04-20 ENCOUNTER — Encounter: Payer: Self-pay | Admitting: ANESTHESIOLOGISTS

## 2016-04-20 ENCOUNTER — Ambulatory Visit: Attending: ANESTHESIOLOGISTS | Admitting: ANESTHESIOLOGISTS

## 2016-04-20 VITALS — BP 141/84 | HR 84 | Temp 98.2°F | Resp 16 | Ht 64.0 in | Wt 159.2 lb

## 2016-04-20 DIAGNOSIS — M5416 Radiculopathy, lumbar region: Principal | ICD-10-CM | POA: Insufficient documentation

## 2016-04-20 DIAGNOSIS — M4316 Spondylolisthesis, lumbar region: Secondary | ICD-10-CM | POA: Insufficient documentation

## 2016-04-20 DIAGNOSIS — M48061 Spinal stenosis, lumbar region without neurogenic claudication: Secondary | ICD-10-CM

## 2016-04-20 DIAGNOSIS — M9983 Other biomechanical lesions of lumbar region: Secondary | ICD-10-CM | POA: Insufficient documentation

## 2016-04-20 MED FILL — sodium chloride 0.9 % injection solution: INTRAMUSCULAR | Qty: 10 | Status: AC

## 2016-04-20 MED FILL — dexAMETHasone sodium phosphate (PF) 10 mg/mL injection solution: INTRAMUSCULAR | Qty: 1 | Status: AC

## 2016-04-20 NOTE — Patient Instructions (Signed)
PAIN MANAGEMENT CLINIC  AFTER VISIT INSTRUCTIONS    PLEASE NOTIFY STAFF IF YOU HAVE A PACEMAKER, AICD, SPINAL CORD STIMULATOR OR OTHER IMPLANTABLE DEVICE BEFORE YOUR NEXT APPOINTMENT.      Pain Clinic [phone] or [phone]: available during business hours, 8 am - 5 pm  On-call physician [phone] THIS IS A NUMERICAL PAGER -DO NOT LEAVE VOICE MESSAGE.  Physician is available after business hours, 7 days per week including holidays.        Procedure Performed Today:     Epidural Steroid - Caudal  Fluoroscopy yes    If you experience any of the following symptoms after your procedure, please notify our office or on-call physician immediately (see above for contact information):   fever (increased oral temperature)   bleeding or swelling at the injection site,    drainage, rash or redness at the injection site    possible signs of infection    increased pain at the injection site   worsening of your usual pain   severe headache   new or worsening numbness    new arm and/or leg weakness, or    changes in bowel and/or bladder function: urinating or defecating on yourself and not knowing that you did it.      PLEASE FOLLOW ALL INSTRUCTIONS CAREFULLY     Do not drive or operate heavy machinery for 12 hours.   Do not engage in strenuous activity (e.g., lifting or pushing heavy objects or repeated bending) for 24 hours.     Do not take a bath, swim or use Jacuzzi for 24 hours after procedure. (A shower is fine).   Remove any Band-Aids when you get home.    Use cold/ice, as needed for comfort.  We recommend the use of cold therapy alternating on for 20 minutes, off for 20 minutes.    Do not apply direct heat (heating pad or heat packs) to the injection site for 24 hours.     Resume your usual medications, unless instructed otherwise by your Pain Physician.     If you are on warfarin (Coumadin) or other blood thinner, resume this  medication as instructed by your prescribing Physician.    Keep a record of your response to the injection you had today.     How much relief did you get?   Were you able to decrease the use of any of your pain medications?   Were you able to increase your level of activity?   How long did the relief last?     NOTE:  If your Pain Physician ordered a procedure, lab, diagnostic study (x-ray, MRI, CT, ultrasound, EMG, EKG, etc.), or specialty referral for you today, please be aware that some insurance carriers may require authorization before they can be scheduled. If these cannot be scheduled at the time of discharge today, you will be contacted with the scheduled appointment.  If you are not contacted within 3-5 business days, please call our office at [phone].          Procedure To Schedule FOR YOUR Next Visit: Epidural Steroid - Caudal  Fluoroscopy yes  VERY IMPORTANT  INSTRUCTIONS FOR YOUR FOLLOW UP APPOINTMENT  Please remember to arrive 15 minutes prior to your appointment time.  This will allow time for check in.  If you arrive after your scheduled appointment time your appointment could be rescheduled or your visit with the physician will be shortened.        NOTE: Please be aware of which location you are scheduled for your next appointment. The [facility] has 2 locations in [place]: (1) the  Ambulatory Care Center at the [facility] and (2) the Spine Center at the [facility]. If you have any questions regarding the location of your next appointment, please call our office: [phone].      PLEASE FOLLOW ALL INSTRUCTIONS CAREFULLY     Please call to confirm the instructions and medication holds specific to your procedure, [phone].    If you are SICK, it is not safe to do your procedure. Therefore, please call us as soon as possible to reschedule. Since we do keep a waiting list, your  courtesy will allow us to schedule another patient. Please, call our office if you have any questions.    If you are NOT IN PAIN OR HAVE MINIMAL PAIN, please call to postpone your procedure (unless otherwise instructed by your Pain Physician).        MEDICATIONS    If your Pain Physician prescribes medications for you, please anticipate when you will require a refill.  It can take 1-3 business days to complete the process. Contact your pharmacy for medication refills. Your pharmacy will contact the Pain Clinic office with detailed medication information.         Driving When You Are Taking Medications    For most people, driving represents freedom, control and independence. Driving enables most people to get to the places they want or need to go. For many people, driving is important economically - some drive as part of their job or to get to and from work.   Driving is a complex skill. Our ability to drive or operate heavy or dangerous machinery safely can be affected by changes in our physical, emotional and mental condition. The goal of this brochure is to help you and your health care professional talk about how your medications may affect your ability to drive safely.     How can medications affect my driving or ability to operate heavy or dangerous machinery?   People take medications for a variety of reasons. Those can include:          allergies          anxiety          colds          depression          diabetes          heart and cholesterol conditions          high blood pressure          muscle spasms          pain          Parkinson's disease          schizophrenia   Medications may be prescribed by your doctor or purchased over-the-counter without a doctor's prescription. Many individuals also take herbal supplements. Some of these medications and supplements may cause a variety of reactions that may make it more difficult for you to  drive a car or operate heavy or dangerous machinery safely. These reactions may include:          sleepiness          blurred vision          dizziness          slowed movement            fainting          inability to focus or pay attention          nausea   Often people take more than one medication at a time. The combination of different medications can cause problems for some people. This is especially true for older adults because they take more medications than any other age group. Due to changes in the body as people age, older adults are more prone to medication related problems. The more medications you take, the greater your risk that your medicines will affect your ability to drive safely. To help avoid problems, it is important that at least once a year you talk to your doctor or pharmacist about all the medications - both prescription and over-the-counter - you are taking. Also let your professional know what herbal supplements, if any, you are taking. Do this even if your medications and supplements are not currently causing you a problem.     Can I still drive or operate heavy or dangerous machinery safely if I am taking medications?     Yes, most people can drive or operate heavy or dangerous machinery safely if they are taking medications. It depends on the effect those medications - both prescription and over-the-counter - have on your coordination and mental function. In some cases you may not be aware of the effects. But, in many instances, your doctor can help to minimize the negative impact of your medications on your driving or operate heavy or dangerous machinery in several ways. Your doctor may be able to:          adjust the dose;          adjust the timing of doses or when you take the medication;          add an exercise or nutrition program to lessen the need for medication; and          change medication to one that causes less drowsiness.     What can I do if I am taking medications?    Talk to your doctor honestly.   When your doctor prescribes a medicine for you, ask about side effects. How should you expect the medicine to affect your ability to drive or operate heavy or dangerous machinery? Remind your doctor of other medications - both prescription and over-the-counter - and herbal supplements you are taking, especially if you see more than one doctor. Talking honestly with your doctor also means telling the doctor if you are not taking all or any of the prescribed medication. Do not stop taking your medication unless your doctor tells you to.   Ask your doctor if you should drive - especially when you first take a medication.   Taking a new medication can cause you to react in a number of ways. It is recommended that you do not drive or operate heavy or dangerous machinery when you first start taking a new medication until you know how that drug affects you. You also need to be aware that some over-the-counter medicines and herbal supplements can make it difficult for you to drive or operate heavy or dangerous machinery safely.     Talk to your pharmacist.   Get to know your pharmacist. Ask the pharmacist to go over your medications with you and to remind you of effects they may have on your ability to drive or operate heavy or dangerous machinery safely. Be sure to request printed information about the side   effects of any new medication. Remind your pharmacist of other medicines and herbal supplements you are taking. Pharmacists are available to answer questions wherever you get your medications. Many people buy medicines by mail. Mail-order pharmacies have a toll-free number you can call and a pharmacist available to answer your questions about medications.   Monitor yourself.   Learn to know how your body reacts to the medications and supplements. Keep track of how you feel after you take the medication. For example, do you feel sleepy? Is your vision blurry? Do you feel weak and slow?  When do these things happen?   Let your doctor and pharmacist know what is happening.   No matter what your reaction is to taking a medicine - good or bad - tell your doctor and pharmacist. Both prescription and over-the-counter medications are powerful-that's why they work. Each person is unique. Two people may respond differently to the same medicine. If you are experiencing side effects, the doctor needs to know that in order to adjust your medication. Your doctor can help you find medications that work best for you.     What if I have to cut back or give up driving?   You can keep your independence even if you have to cut back or give up on your driving due to your need to take medications. It may take planning ahead on your part, but it will get you to the places you want to go and the people you want to see. Consider:          rides with family and friends;          taxi cabs;          shuttle buses or vans;          public buses, trains and subways; and          walking.   Also, senior centers and religious and other local service groups often offer transportation services for older adults in the community.     Thank you for choosing [facility].

## 2016-04-20 NOTE — Progress Notes (Signed)
Informed Consent:  The patient's condition and proposed procedures, risks, and alternatives were discussed with the patient or responsible party.  The patient's / responsible party's questions were answered.   The patient / responsible party appeared to understand and chose to proceed.  Informed consent was obtained.    Procedural Pause:  A procedural pause verifying correct patient, medical record number, allergies, and surgical site was performed immediately prior to beginning the procedure.    I (Eilene Voigt, MD) was physically present during key portion(s) of the procedure including the procedural pause and initiation of the procedure and during other times, was immediately available to return to the procedure on the date indicated in Dr. DiGirolamo's, the pain fellow's, note.  Please see EMR note for details.    I (Lorelai Huyser, MD) evaluated and examined the patient with the pain fellow on the date indicated in the fellow's note.  We developed the plan together as outlined in the pain fellow's note.   The patient was instructed and educated on all aspects of the plan of care. The patient acknowledged the plan of care.  Erica Markell Gregory Naryiah Schley, MD  Attending Physician  Department of Anesthesiology & Pain Medicine  Note Electronically Signed By:  Kabeer Hoagland G. Tiann Saha, MD  Professor of Anesthesiology and Pain Medicine  Division of Pain Medicine

## 2016-04-20 NOTE — Progress Notes (Signed)
[facility]  Department of Anesthesiology and Pain Medicine  [address]  [place], [place] [address]  Phone: [phone]    Fax: [phone]    Date: April 20, 2016    Re: Erica Hancock  MR#: [id number]  Date of Birth: May 05, 1955  Age: 61yr    Procedure:  Epidural Steroid - Caudal    See Procedure Note for Discussion and Recommendations    Dear Dr. Harley Alto, DO    We had the pleasure of treating your patient, Erica Hancock, today at the [facility].  As you know, the patient is a 61yr -old female with a chief complaint of:    Chief Complaint   Patient presents with    Back Pain    Buttock Pain       Ms. Coffman states that the pain problem started 10+ year(s) ago.     She notes pain located in the areas indicated in the pain location diagram below.     Pain Location Diagram        Ms. Epler describes the pain as sharp, throbbing and pressure    Pain Intensity (VAS): 0-10 Scale (0 = No Pain, 10 = Worst Imaginable Pain)  Currently, Ms. Fakhoury has a VAS score of 7 / 10   On average, Ms. Markowicz's pain score for the last week has been a VAS of 7 / 10     Patient Active Problem List   Diagnosis    Migraines    Insomnia    Arthritis of carpometacarpal (CMC) joint of right thumb    Asthma    HLD (hyperlipidemia)    Spondylolisthesis of lumbar region    Foraminal stenosis of lumbar region    Lumbar radicular pain    Calcific tendinitis of left shoulder    Pain in joint of left shoulder    Basal cell carcinoma of skin of nose    Basal cell carcinoma    Osteopenia    Recurrent major depressive disorder, in partial remission       RESULTS OF MOST RECENT PROCEDURE: Ms. Areola did not undergo a recent procedure.    PREVIOUS DIAGNOSTIC STUDIES: The imaging results below were reviewed.   MRI and Xray of lumbar spine    No results found for: WBC, HGB, HCT, PLT    Urine Toxicology  No results found for: DRUGGCSCRNU  No results found for:  AMPHETSCRNU  No results found for: BENZOSSCRNU  No results found for: COCAINESCRNU  No results found for: BARBSSCRNU     Ms. Dishon has not had an infection, fever, or chills in the past 1 week.   Ms. Ciaccio has not taken any antibiotics in the past 1 week.     Ms. Deberry does not have a history of problems with anesthesia in the past.     Ms. Maheras had solid food equal to eight (8) hours ago.   Ms. Struve had clear liquids equal to two (2) hours ago.     She was questioned regarding a history of stridor, snoring, or sleep apnea. Ms. Santoso states that she has a history of snoring.     Female Patients Only:  The pt states that there is not a possibility that she is pregnant    Outpatient Prescriptions Marked as Taking for the 04/20/16 encounter (Office Visit) with Amparo Bristol, MD   Medication Sig Dispense Refill  Amitriptyline (ELAVIL) 50 mg Tablet Take 50 mg by mouth every day at bedtime. Indications: Depression      BECLOMETHASONE DIPROPIONATE (QVAR INHA) 2 times daily.      ESTRACE 0.01 % (0.1 mg/gram) Vaginal Cream Insert into the vagina three times a week.      IBUPROFEN (MOTRIN PO) Take 600 mg by mouth if needed.      Meloxicam (MOBIC) 7.5 mg tablet Take 1 tablet by mouth once daily after a meal. 30 tablet 3    MISC ELECTROLYTE REPLACEMENT (CALCIUM, [place]++,)       MULTIVITAMIN WITH MINERALS (MULTIVITAMIN & MINERAL FORMULA PO) every day.         The patient has not taken blood thinning medications such as ASA, NSAIDs, Plavix, Ticlid, and Coumadin.    Allergies   Allergen Reactions    Epinephrine Tachycardia    Sulfa (Sulfonamide Antibiotics) Anaphylaxis       Past Medical History:   Diagnosis Date    Asthma 02/11/2016    Basal cell carcinoma 04/14/2016    Basal cell carcinoma of skin of nose 04/14/2016    Depression     Osteopenia 04/14/2016    Recurrent major depressive disorder, in partial remission 04/14/2016       Past Surgical History:   Procedure Laterality Date    INJECTION, STEROID       Right thumb    TONSILLECTOMY         Family History   Problem Relation Age of Onset    Hypertension Mother     Heart Disease Mother     Blood Disease Father     Breast Cancer Sister      dx age 59       Social History     Social History    Marital status: MARRIED     Spouse name: N/A    Number of children: N/A    Years of education: N/A     Occupational History    self employed      Aeronautical engineer     Social History Main Topics    Smoking status: Former Smoker     Packs/day: 1.00     Years: 12.00     Types: Cigarettes     Quit date: 03/01/1979    Smokeless tobacco: Never Used    Alcohol use 3.6 oz/week     6 Glasses of wine per week    Drug use: No    Sexual activity: Not Asked     Other Topics Concern    None     Social History Narrative       Ms. Leaf's past medical, family, and social history were reviewed.    PHYSICAL EXAM:    Vitals:    04/20/16 0940 04/20/16 1037   BP: 131/69 131/70   Pulse: 88 85   Resp: 16 16   Temp: 36.8 C (98.2 F)    TempSrc: Oral    SpO2: 98% 96%   Weight: 72.2 kg (159 lb 2.8 oz)    Height: 1.626 m (5\' 4" )      Body mass index is 27.32 kg/(m^2).   Mallampati image  Constitutional: Normally developed, no deformities,well groomed., healthy, alert, no distress, pleasant affect, cooperative  Skin:  Skin color, texture, turgor normal. Left side of nose with healing wound- wound base closed/healed with surrounding erythema- no purulence, drainage or discharge  Airway - Required for Procedures: Mallampati class 2, Oral Eval: Mouth opening normal, neck  ROM  full, Thyroid-Mentum distance in fingerbreaths  4  Respiratory: clear to auscultation.  Cardiovascular:  normal rate and regular rhythm, no murmurs, clicks, or gallops.  Psych:Appearance/Cooperation: in no apparent distress  Attitude: pleasant     ASA Status:  2 - Mild, controlled systemic disease and no functional limitations    Preprocedural Diagnosis    ICD-10-CM    1. Lumbar radicular pain M54.16  EPIDURAL STEROID INJECTIONS        No orders of the defined types were placed in this encounter.      Informed Consent:  The patient's condition and proposed procedures, risks, and alternatives were discussed with the patient or responsible party.  The patient's / responsible party's questions were answered.   The patient / responsible party appeared to understand and chose to proceed.  Informed consent was obtained.  Procedural Pause:  A procedural pause verifying correct patient, medical record number, allergies, and surgical site was performed immediately prior to beginning the procedure.  The patient was instructed and educated on all aspects of the plan of care.  The patient acknowledged the plan of care.  I saw and evaluated the patient with my attending physician, Dr. Jerl Santos Adel Neyer, DO  Fellow  Department of Anesthesiology & Pain Medicine    Note Electronically Signed By:  Arna Snipe, DO

## 2016-04-20 NOTE — Progress Notes (Signed)
Procedure: Caudal Epidural Steroid Injection with Fluoroscopy       Epidural Steroid Injection # 1  Series # 1    Postprocedural Diagnosis: Same    Anesthesia - None  IV Fluids - None  Needle Type: - Spinal Needle -  22 gauge, 3.5 inch, bent tip   Contrast Dye - iohexol 180 mgI/ml - 2 ml   Injected Steroid Solution: dexamethasone 5 mg and pf normal saline 2 ml  Additional Medications Administered: none    Estimated Blood Loss - <2 ml  Drains: None  Specimens Removed: None  Urine Output - Not Measured  Complications: no apparent complications  Outcome: good    Informed Consent:  The patient's condition and proposed procedures, risks, and alternatives were discussed with the patient or responsible party.  The patient's / responsible party's questions were answered.   The patient / responsible party appeared to understand and chose to proceed.  Informed consent was obtained.    After obtaining written consent, an IV hep lock was placed. (See nurses notes for details).   Procedure in Detail:  The patient was taken back to the OR fluoroscopy suite and placed in a prone position with a pillow under the abdomen to decrease lordosis.  The skin overlying the lumbosacral area was prepped and draped in an aseptic fashion.  The sacral hiatus was identified with the aid of fluoroscopy.      Procedural Pause:  A procedural pause verifying correct patient, medical record number, allergies, medications to be administered, current vital signs, and surgical site was performed immediately prior to beginning the procedure.    The skin and subcutaneous tissue overlying the target site of injection was anesthetized using 2 ml of 1% lidocaine MPF with a 25-gauge, 1 -inch needle.   The above documented needle was then introduced into the epidural space via the sacral hiatus.  Needle position in the sacral canal was verified by lateral view on fluoroscopy.  A microbore extension tubing was attached to the needle to minimize any movement  of the needle during injection or syringe change. After negative aspiration for heme or CSF, the above noted contrast was injected. An epidurogram was confirmed using AP and lateral fluoroscopy.  After repeat negative aspiration for heme or CSF, the above noted steroid solution was slowly injected in increments. The needle was then retracted approximately halfway and the needle track was flushed with 0.5 ml of PF saline to clear the needle prior to removal. The needle was then removed.   The heart rate, pulse oximetry, and blood pressure were continuously monitored throughout the procedure (see Nursing Flowsheet for details).  The patient tolerated the procedure well.. The patient was carefully escorted to the recovery room in stable condition.  Patient was monitored by RN for recovery period. After meeting discharge criteria, the patient was discharged with driver.     DISCUSSION:  Conditions of the spine that result in spinal nerve root irritation such as disc protrusions, spinal stenosis, or post surgical radiculitis often respond favorably to epidural steroid injections.  The goal in performing epidural steroid injections is to provide relief from pain and permit greater function.     Today we performed a caudal epidural steroid injection.  The patient was informed that it may take several days for the epidural steroid medication to reach its full efficacy and she should continue her regular pain medications as prescribed.  The patient was advised to relax and avoid any heavy lifting or excessive bending for  the rest of the day.   She was advised that she may return to her usual activities after 24 hours if she is otherwise feeling well.    The patient was advised not to bathe or soak in water for 24 hours but that showering would be acceptable.   The patient was instructed that if she experienced fever or chills, new weakness, new sensory changes, any changes in bowel or bladder habits, worsening back pain, new  headache, neck stiffness, or other new symptoms, that she should contact the pain clinic immediately or dial 911 if unable to reach the pain clinic.  The patient has agreed not to travel out of the area for the next 4 days following the procedure so that she can be reevaluated if necessary.          RECOMMENDATIONS:  1. We will plan to have the patient follow up in 2-3 months for a repeat epidural steroid injection. If the patient is doing well prior to the next scheduled injection, the patient will contact us and reschedule to a later date..   2. No medications were prescribed at today's visit.  3. The patient has agreed not to travel out of the area for the next 4 days following the procedure so that she can be reevaluated if necessary.  4. Additional recommendations: None         Informed Consent:  The patient's condition and proposed procedures, risks, and alternatives were discussed with the patient or responsible party.  The patient's / responsible party's questions were answered.   The patient / responsible party appeared to understand and chose to proceed.  Informed consent was obtained.  Procedural Pause:  A procedural pause verifying correct patient, medical record number, allergies, and surgical site was performed immediately prior to beginning the procedure.  The patient was instructed and educated on all aspects of the plan of care.  The patient acknowledged the plan of care.  I saw and evaluated the patient with my attending physician, Dr. Jerl Santos Paola Flynt, DO  Fellow  Department of Anesthesiology & Pain Medicine    Note Electronically Signed By:  Arna Snipe, DO

## 2016-04-20 NOTE — Progress Notes (Signed)
Amount of time spent educating patient: 30 minutes    Education     Title: AMB (Adult) Pre Operative/Post Operative (Resolved)     Topic: Pre Operative/Post Operative Overview (Resolved)     Point: Description (Resolved)    Description: Description    Learning Progress Summary    Learner Readiness Method Response Comment Documented by Status   Patient Acceptance E VU  VS 04/20/16 1015 Done               Point: Anatomy (Resolved)    Description: Anatomy    Learning Progress Summary    Learner Readiness Method Response Comment Documented by Status   Patient Acceptance E VU  VS 04/20/16 1015 Done               Point: Physiology (Resolved)    Description: Physiology    Learning Progress Summary    Learner Readiness Method Response Comment Documented by Status   Patient Acceptance E VU  VS 04/20/16 1015 Done               Point: Cause(s) (Resolved)    Description: Cause(s)    Learning Progress Summary    Learner Readiness Method Response Comment Documented by Status   Patient Acceptance E VU  VS 04/20/16 1015 Done                Topic: Signs/Symptoms (Resolved)     Point: Preoperative Phase of Care (Resolved)    Description: Preoperative Phase of Care    Learning Progress Summary    Learner Readiness Method Response Comment Documented by Status   Patient Acceptance E VU  VS 04/20/16 1015 Done               Point: Intraoperative Phase of Care (Resolved)    Description: Intraoperative Phase of Care    Learning Progress Summary    Learner Readiness Method Response Comment Documented by Status   Patient Acceptance E VU  VS 04/20/16 1015 Done               Point: Postoperative Phase of Care (Resolved)    Description: Postoperative Phase of Care    Learning Progress Summary    Learner Readiness Method Response Comment Documented by Status   Patient Acceptance E VU  VS 04/20/16 1015 Done                Topic: Treatment Plan (Resolved)     Point: Activity (Intraoperative): Positioning As Appropriate to Procedure (Resolved)     Description: Activity (Intraoperative): Positioning As Appropriate to Procedure    Learning Progress Summary    Learner Readiness Method Response Comment Documented by Status   Patient Acceptance E VU  VS 04/20/16 1015 Done                Topic: Self-Management (Resolved)     Point: Preoperative Preparation Regimen (Resolved)    Description: Preoperative Preparation Regimen    Learning Progress Summary    Learner Readiness Method Response Comment Documented by Status   Patient Acceptance E VU  VS 04/20/16 1015 Done               Point: Activity Restrictions: Avoid Driving (Resolved)    Description: Activity Restrictions: Avoid Driving    Learning Progress Summary    Learner Readiness Method Response Comment Documented by Status   Patient Acceptance E VU  VS 04/20/16 1015 Done  Point: Activity Restrictions: Avoid Operating Machinery (Resolved)    Description: Activity Restrictions: Avoid Operating Machinery    Learning Progress Summary    Learner Readiness Method Response Comment Documented by Status   Patient Acceptance E VU  VS 04/20/16 1015 Done               Point: Activity Restrictions: Avoid Signing Legal Documents For 24 Hours (Resolved)    Description: Activity Restrictions: Avoid Signing Legal Documents For 24 Hours    Learning Progress Summary    Learner Readiness Method Response Comment Documented by Status   Patient Acceptance E VU  VS 04/20/16 1015 Done               Point: Dressing/Incision/Drain Care (Resolved)    Description: Dressing/Incision/Drain Care    Learning Progress Summary    Learner Readiness Method Response Comment Documented by Status   Patient Acceptance E VU  VS 04/20/16 1015 Done               Point: Follow-Up With Doctor (Resolved)    Description: Follow-Up With Doctor    Learning Progress Summary    Learner Readiness Method Response Comment Documented by Status   Patient Acceptance E VU  VS 04/20/16 1015 Done               Point: Infection Prevention/Monitoring  (Resolved)    Description: Infection Prevention/Monitoring    Learning Progress Summary    Learner Readiness Method Response Comment Documented by Status   Patient Acceptance E VU  VS 04/20/16 1015 Done               Point: Pain Management (Resolved)    Description: Pain Management    Learning Progress Summary    Learner Readiness Method Response Comment Documented by Status   Patient Acceptance E VU  VS 04/20/16 1015 Done                Topic: When to Seek Medical Attention (Resolved)     Point: Changes in sensation. Bladder/bowel dysfunction (Resolved)    Description:     Learning Progress Summary    Learner Readiness Method Response Comment Documented by Status   Patient Acceptance E VU  VS 04/20/16 1015 Done               Point: Infection Sign/Symptom: Fever Greater Than 100.5 Degrees Fahrenheit/38 Degrees Celsius (Resolved)    Description: Infection Sign/Symptom: Fever Greater Than 100.5 Degrees Fahrenheit/38 Degrees Celsius    Learning Progress Summary    Learner Readiness Method Response Comment Documented by Status   Patient Acceptance E VU  VS 04/20/16 1015 Done               Point: Persistent Pain (Resolved)    Description: Persistent Pain    Learning Progress Summary    Learner Readiness Method Response Comment Documented by Status   Patient Acceptance E VU  VS 04/20/16 1015 Done               Point: Persistent Bleeding (Resolved)    Description: Persistent Bleeding    Learning Progress Summary    Learner Readiness Method Response Comment Documented by Status   Patient Acceptance E VU  VS 04/20/16 1015 Done               Point: Signs/Symptoms of Infection (Resolved)    Description: Signs/Symptoms of Infection    Learning Progress Summary    Learner Readiness Method Response Comment Documented  by Status   Patient Acceptance E VU  VS 04/20/16 [place] Effective Dates Name Provider Type Discipline    VS 10/30/14 -  Mickel Crow, RN .NURSE: (RN or LVN)  Interdisciplinary

## 2016-05-26 ENCOUNTER — Telehealth: Payer: Self-pay | Admitting: Family Medicine

## 2016-05-26 NOTE — Telephone Encounter (Signed)
Erica Hancock is a 61yr old female  3 patient identifiers used.  Per:   patient.      ClearTriage Note: Day 5 of constipation. Small BM today. Chronic constipation but for the last 8 months having BM twice a week but has a hard time and feels poorly between movements. Takes Colace 200 mg daily every night for the last 4 months. Magnesium and calcium. 2 TBSP mineral oil this AM. Denies abdominal distention, but has some lower abdominal discomfort.  Has tried miralax in the past.  Dulcolax 2 nights ago. Took 1/2 bottle Mag Citrate around 3pm. Requesting to discuss what else she can do.    Protocol Used: Constipation (Adult)  Protocol-Based Disposition: See PCP When Office is Open (within 3 days)    Positive Triage Questions:  * [1] Constipation persists > 1 week AND [2] following Constipation Care Advice  * Treating constipation with Over-The-Counter (OTC) medicines, questions about    Negative Triage Questions:  * Patient sounds very sick or weak to the triager  * [1] Vomiting AND [2] abdomen looks much more swollen than usual  * [1] Vomiting AND [2] contains bile (green color)  * [1] Constant abdominal pain AND [2] present > 2 hours  * [1] Rectal pain or fullness from fecal impaction (rectum full of stool) AND [2] NOT better after SITZ bath, suppository or enema  * [1] Intermittent mild abdominal pain AND [2] fever  * Abdomen is more swollen than usual  * Last bowel movement (BM) > 4 days ago  * Leaking stool  * Unable to have a bowel movement (BM) without manually removing stool (using finger to pull out stool or perform disimpaction)  * Unable to have a bowel movement (BM) without laxative or enema    Care Advice Discussed:  * General Constipation Instructions  * Enemas      - Should be used Rarely and only after other measures have not worked.      - Do not use an enema if you have neutropenia (very low white cell count).  * Reasons To Call Back      - Constipation lasts more than 1 week after using Care  Advice      - Abdominal swelling, vomiting or fever occur      - Constant or increasing abdominal pain      - You think you need to be seen      - You become worse.      - No BM for over 4 days    Disposition: Advice given per  Protocol. Patient has good understanding of bowel care and has tried almost all OTC resources. Advised to drink other half of mag citrate if no BM in next 2-3 hours. If no BM after Mag citrate, use enema. If not BM after that seek ER evaluation. Discussed ER precautions. Follow up in office as scheduled.   Per:   patient verbalizes agreement to plan. Agrees to callback with any increase in symptoms/concerns or questions.     Annett Fabian, RN  PCN

## 2016-05-30 ENCOUNTER — Ambulatory Visit (INDEPENDENT_AMBULATORY_CARE_PROVIDER_SITE_OTHER)

## 2016-05-30 ENCOUNTER — Ambulatory Visit: Attending: Family Medicine

## 2016-05-30 VITALS — BP 116/70 | HR 84 | Resp 16 | Wt 159.6 lb

## 2016-05-30 DIAGNOSIS — K5909 Other constipation: Secondary | ICD-10-CM | POA: Insufficient documentation

## 2016-05-30 DIAGNOSIS — Z0000 Encounter for general adult medical examination without abnormal findings: Principal | ICD-10-CM

## 2016-05-30 LAB — CBC WITH DIFFERENTIAL
BASOPHILS % AUTO: 0.7 %
BASOPHILS ABS AUTO: 0 10*3/uL (ref 0.0–0.2)
EOSINOPHIL % AUTO: 4.1 %
EOSINOPHIL ABS AUTO: 0.2 10*3/uL (ref 0.0–0.5)
HEMATOCRIT: 42.4 % (ref 36.0–46.0)
HEMOGLOBIN: 14.3 g/dL (ref 12.0–16.0)
LYMPHOCYTES % AUTO: 36 %
Lymphocytes Abs Auto: 1.9 10*3/uL (ref 1.0–4.8)
MCH: 32.8 pg (ref 27.0–33.0)
MCHC: 33.8 % (ref 32.0–36.0)
MCV: 96.9 UM3 (ref 80.0–100.0)
MONOCYTES % AUTO: 5.4 %
MONOCYTES ABS AUTO: 0.3 10*3/uL (ref 0.1–0.8)
MPV: 7.7 UM3 (ref 6.8–10.0)
NEUTROPHILS % AUTO: 53.8 %
Neutrophils Abs Auto: 2.8 10*3/uL (ref 1.8–7.7)
PLATELET COUNT: 319 10*3/uL (ref 130–400)
RDW: 12.5 % (ref 0.0–14.7)
RED CELL COUNT: 4.38 10*6/uL (ref 4.00–5.20)
WHITE BLOOD CELL COUNT: 5.2 10*3/uL (ref 4.5–11.0)

## 2016-05-30 LAB — COMPREHENSIVE METABOLIC PANEL
ALANINE TRANSFERASE (ALT): 29 U/L (ref 5–54)
ALBUMIN: 4.2 g/dL (ref 3.2–4.6)
ALKALINE PHOSPHATASE (ALP): 50 U/L (ref 35–115)
ASPARTATE TRANSAMINASE (AST): 26 U/L (ref 15–43)
BILIRUBIN TOTAL: 0.4 mg/dL (ref 0.3–1.3)
CALCIUM: 9.5 mg/dL (ref 8.6–10.5)
CARBON DIOXIDE TOTAL: 26 mmol/L (ref 24–32)
CHLORIDE: 103 mmol/L (ref 95–110)
CREATININE BLOOD: 0.87 mg/dL (ref 0.44–1.27)
GLUCOSE: 90 mg/dL (ref 70–99)
POTASSIUM: 4.3 mmol/L (ref 3.3–5.0)
PROTEIN: 6.7 g/dL (ref 6.3–8.3)
SODIUM: 139 mmol/L (ref 135–145)
UREA NITROGEN, BLOOD (BUN): 14 mg/dL (ref 8–22)

## 2016-05-30 LAB — LIPID PANEL WITH DLDL REFLEX
CHOLESTEROL: 261 mg/dL — AB (ref 0–200)
HDL CHOLESTEROL: 50 mg/dL (ref >=35)
LDL CHOLESTEROL CALCULATION: 145 mg/dL — AB (ref <130)
NON-HDL CHOLESTEROL: 211 mg/dL — AB (ref <=150)
TRIGLYCERIDE: 329 mg/dL — AB (ref 35–160)
Total Cholesterol: HDL Ratio: 5.2 — ABNORMAL HIGH (ref <4.0)

## 2016-05-30 LAB — HEMOGLOBIN A1C
HGB A1C,GLUCOSE EST AVG: 114 mg/dL
HGB A1C: 5.6 % (ref 3.9–5.6)

## 2016-05-30 LAB — TSH WITH FREE T4 REFLEX: THYROID STIMULATING HORMONE: 2.69 u[IU]/mL (ref 0.35–3.30)

## 2016-05-30 NOTE — Progress Notes (Signed)
SUBJECTIVE:  Patient is a 61 year old female who presents with a long history of chronic constipation.  She reports having a bowel movement approximately 5 day intervals.  She states the consistency of her stool is marble-size and firm.  She also states that she experiences bloating and abdominal discomfort when she gets constipated.  Over the years she has tried multiple treatments including bulk forming laxatives as well as stimulant laxatives.  Contributing factor may include the patient's use of amitriptyline for depression and insomnia. Due to the anticholinergic effects of TCAs and resultant constipation.  In addition, patient has been taking OTC calcium supplements for osteopenia.  Patient denies any dysphasia, heartburn, change in appetite, nausea, change in bowel habits, rectal bleeding, diarrhea, yellow eyes or skin.    [place]gil and ROS have been reviewed with the patient.     Current Outpatient Prescriptions   Medication Sig Dispense Refill    Amitriptyline (ELAVIL) 50 mg Tablet Take 50 mg by mouth every day at bedtime. Indications: Depression      BECLOMETHASONE DIPROPIONATE (QVAR INHA) 2 times daily.      ESTRACE 0.01 % (0.1 mg/gram) Vaginal Cream Insert into the vagina three times a week.      IBUPROFEN (MOTRIN PO) Take 600 mg by mouth if needed.      Meloxicam (MOBIC) 7.5 mg tablet Take 1 tablet by mouth once daily after a meal. 30 tablet 3    MULTIVITAMIN WITH MINERALS (MULTIVITAMIN & MINERAL FORMULA PO) every day.       No current facility-administered medications for this visit.        OBJECTIVE:  BP 116/70  Pulse 84  Resp 16  Wt 72.4 kg (159 lb 9.8 oz)  BMI 27.4 kg/m2    GENERAL: Pt. is well developed, well-nourished, in no apparent distress. Vital signs as documented.  LUNGS: Lung fields were clear to auscultation bilaterally.  HEART: Regular rate and rhythm, normal S1 and S2, no murmur, no gallops.  ABDOMEN: Bowel sounds present in all 4 quadrants. Abdomen was soft, nontender, and  nondistended. No masses or hepatosplenomegaly.  EXTREMITIES: Patient was able to move all extremities well without limitations. There was no clubbing or cyanosis. No edema.  - Peripheral pulses were 2+/2+ and equal bilaterally throughout.  PSYCHIATRIC: Orientation - Oriented to time, place, person and situation. Appropriate mood and affect. Normal insight, Normal judgment. Memory - normal.      ASSESSMENT / PLAN:  1. Chronic constipation  Patient was informed that a contributing factor may possibly be the amitriptyline along with her calcium supplementation.  She was advised to discontinue all supplements and take OTC Metamucil or Citrucel and double the dose until her stool becomes soft and easier to pass.  It was also emphasized that she needed to increase her p.o. fluids/hydration along with the increase fiber/psyllium.  Patient is to return to office for review of her amitriptyline and consideration of switching her to an SSRI for her depression.  In the interim, she may alternate her amitriptyline dose by taking 50 mg every other day alternating with 25 mg every other day.      Discussed risks:  - The importance of compliance with medications, referrals, POA and follow up were explained to the Patient and the Patient understands that failure to comply with advised directions can result in significant morbidity and mortality.  - The patient verbalized an understanding of all instructions and plan.       Edison Simon. Tamala Julian, M.D., FAAFP  Fellow,  [employer]  Diplomate, American Board of [place]

## 2016-05-30 NOTE — Nursing Note (Signed)
Patient roomed, chief complaint noted, allergies verified, blood pressure, pulse, respiration, and weight obtained, screened for pain, and pharmacy verified.  Lyle Niblett, M.A.

## 2016-05-31 ENCOUNTER — Encounter: Payer: Self-pay | Admitting: Family Medicine

## 2016-05-31 DIAGNOSIS — E782 Mixed hyperlipidemia: Secondary | ICD-10-CM | POA: Insufficient documentation

## 2016-06-08 ENCOUNTER — Ambulatory Visit: Attending: Family Medicine

## 2016-06-08 DIAGNOSIS — J45909 Unspecified asthma, uncomplicated: Secondary | ICD-10-CM

## 2016-06-08 NOTE — Progress Notes (Signed)
Pulmonary function test and or 6MWT performed today. Final physician interpreted result to follow within 7 days.

## 2016-06-09 ENCOUNTER — Telehealth: Payer: Self-pay | Admitting: ANESTHESIOLOGISTS

## 2016-06-09 LAB — PULMONARY FUNCTION TEST COMPLETE  (PFT) - PULMONARY LAB
DLCO (POST): 20.8 mL/mmHg/min (ref 0.05–99.99)
FEV1 (POST): 2.63 L (ref 0–12)
FEV1 (PRE): 2.48 L (ref 0–12)
FEV1/FVC (POST): 72 % (ref 0–12)
FEV1/FVC (PRE): 67 % (ref 0–12)
FVC (POST): 3.63 L (ref 0–12)
FVC (PRE): 3.69 L (ref 0–12)
TLC (PRE): 5.43 L (ref 0.05–11.99)

## 2016-06-09 NOTE — Telephone Encounter (Signed)
Yes, okay to schedule for another caudal injection on 06/17/16 before her road trip on 06/21/16.

## 2016-06-09 NOTE — Telephone Encounter (Signed)
Dear  Dr. Luis Abed    Ms. Rojero was identified by name, date of birth, and mailing address.    The caller is self    Phone number [phone]    Reason for Call: patient is requesting to come in earlier than requested LS 2.21.18 Repeat Caudal with PK in 2-3 mos. Patient pain level is at a 7-8 on her lower back.     Action Requested: please advise    Thank you,    Note Electronically Signed By:  Peggye Ley Mosc III

## 2016-06-09 NOTE — Telephone Encounter (Signed)
Called patient ID x 3. Last LES 04/20/16 with PK and AD. Recommended to repeat in 2-3 months. Patient received 6 weeks of good relief from injection. Her pain was 3/10 and her quality of life improved. She is currently taking meloxicam (prescribed by ER MD) every day for her shoulder that froze up and motrin at night. The past 3 weeks trouble sleeping due to pain. Scheduled to see Dr. Roselie Awkward 06/15/16. Would like to have another caudal injection on 06/17/16 before her road trip on 06/21/16. Okay to schedule? Please advise.    Message reviewed, determined further review/action is needed from Physician.

## 2016-06-10 NOTE — Telephone Encounter (Signed)
Patient scheduled 06/17/16 with PK

## 2016-06-14 ENCOUNTER — Other Ambulatory Visit: Payer: Self-pay | Admitting: Family Medicine

## 2016-06-14 MED ORDER — AMITRIPTYLINE 50 MG TABLET
50.0000 mg | ORAL_TABLET | Freq: Every day | ORAL | 5 refills | Status: DC
Start: 2016-06-14 — End: 2016-10-11

## 2016-06-14 NOTE — Telephone Encounter (Signed)
She is calling for a refill of Amitriptyline (ELAVIL) .  She did not call her pharmacy for a refill already. She states that the dose is 50 mg and the instructions are: Take 50 mg by mouth every day at bedtime. Indications: Depression. She requests Send to preferred pharmacy.    Patient has an upcoming appointment for follow depression on 05/10.

## 2016-06-14 NOTE — Telephone Encounter (Signed)
Patient was last seen with PCP on 05/30/16    Ardine Eng, MA

## 2016-06-15 ENCOUNTER — Ambulatory Visit (HOSPITAL_BASED_OUTPATIENT_CLINIC_OR_DEPARTMENT_OTHER): Admitting: Orthopaedic Surgery of the Spine

## 2016-06-15 ENCOUNTER — Ambulatory Visit

## 2016-06-15 ENCOUNTER — Ambulatory Visit
Admission: RE | Admit: 2016-06-15 | Discharge: 2016-06-15 | Disposition: A | Discharge status: Home / Self Care | Source: Ambulatory Visit | Attending: Diagnostic Radiology | Admitting: Diagnostic Radiology

## 2016-06-15 ENCOUNTER — Encounter: Payer: Self-pay | Admitting: Orthopaedic Surgery of the Spine

## 2016-06-15 VITALS — BP 120/72 | HR 68 | Temp 98.8°F | Resp 16 | Ht 64.02 in | Wt 157.4 lb

## 2016-06-15 VITALS — BP 122/75 | HR 67 | Resp 16 | Ht 64.5 in | Wt 155.0 lb

## 2016-06-15 DIAGNOSIS — M5416 Radiculopathy, lumbar region: Principal | ICD-10-CM

## 2016-06-15 DIAGNOSIS — M2420 Disorder of ligament, unspecified site: Secondary | ICD-10-CM

## 2016-06-15 DIAGNOSIS — M48061 Spinal stenosis, lumbar region without neurogenic claudication: Secondary | ICD-10-CM

## 2016-06-15 DIAGNOSIS — G44209 Tension-type headache, unspecified, not intractable: Principal | ICD-10-CM

## 2016-06-15 DIAGNOSIS — M47816 Spondylosis without myelopathy or radiculopathy, lumbar region: Secondary | ICD-10-CM

## 2016-06-15 DIAGNOSIS — M4316 Spondylolisthesis, lumbar region: Secondary | ICD-10-CM

## 2016-06-15 MED ORDER — GABAPENTIN 300 MG CAPSULE
300.0000 mg | ORAL_CAPSULE | Freq: Three times a day (TID) | ORAL | 2 refills | Status: DC
Start: 2016-06-15 — End: 2016-07-11

## 2016-06-15 MED ORDER — CELECOXIB 100 MG CAPSULE
100.0000 mg | ORAL_CAPSULE | Freq: Two times a day (BID) | ORAL | 2 refills | Status: DC
Start: 2016-06-15 — End: 2016-09-20

## 2016-06-15 NOTE — Progress Notes (Signed)
Orthopaedic Spine H&P/ Initial Consultation Note    Date of visit: 06/15/2016    Thank you Harley Alto, DO for the kind referral of Erica Hancock to the [facility].     CHIEF COMPLAINT:  Back pain    HISTORY OF PRESENT ILLNESS:    Erica Hancock is a 61yr -old Caucasian female with a PMH of lumbar stenosis who presents today with 10+ years of low back/buttock pain and RLE radiculopathy. She says that the majority of her pain is in the back/R buttock area with pain down the right leg every other day or so that she rates at 6-7/10 and burning. She reports mild weakness in the RLE and diffuse scattered decreased sensation subjectively in BLE. She had an caudal ESI in February and experienced 80% relief which lasted about 5-6wks and is scheduled to have a repeat injection this Friday. She has been taking anti-inflammatory medications but has recently had an upset stomach.      Pain descriptors    Pain intensity on average is 6 out of 10.    The quality of back pain is noted as:  sharp, shooting, electric-like and throbbing.    The pain is present occasional.    Radiation: RLE   Relieving factors: exercise   Aggravating factors: standing, sitting, walking, over-activity   Bowel/ bladder symptoms: no   Saddle anesthesia: no    Function:   Limitations in activities of daily living:  yes   Problems with ambulation:  no   Problems with balance:  no   Limited or inability to go to work:  no   Limited or inability to perform house or yard work:  no   Limited or inability to exercise:  yes   Uses adaptive equipment:  no    The patient has had a trial of:    Physical therapy:  no    Medications:  yes    Epidural steroid/ nerve root- injections :  yes    Radio frequency ablation of facets:  no     PROBLEM LIST/  PAST MEDICAL/  SURGICAL/  SOCIAL/  FAMILY HISTORY:    Patient Active Problem List   Diagnosis    Migraines    Insomnia    Arthritis of carpometacarpal (CMC) joint of right thumb    Asthma     HLD (hyperlipidemia)    Spondylolisthesis of lumbar region    Foraminal stenosis of lumbar region    Lumbar radicular pain    Calcific tendinitis of left shoulder    Pain in joint of left shoulder    Basal cell carcinoma of skin of nose    Basal cell carcinoma    Osteopenia    Recurrent major depressive disorder, in partial remission    Mixed hypercholesterolemia and hypertriglyceridemia     Past Medical History:   Diagnosis Date    Asthma 02/11/2016    Basal cell carcinoma 04/14/2016    Basal cell carcinoma of skin of nose 04/14/2016    Depression     Mixed hypercholesterolemia and hypertriglyceridemia     Osteopenia 04/14/2016    Recurrent major depressive disorder, in partial remission 04/14/2016     Past Surgical History:   Procedure Laterality Date    INJECTION, STEROID      Right thumb    TONSILLECTOMY       I reviewed the patient's past medical and family/social history, and updated below.  PM/SH:  Reviewed as above  SocH:  Patient  has good social support,  Tobacco use: no   FH:  Noncontributory     CURRENT MEDICATIONS:   Current Outpatient Prescriptions   Medication Sig    Amitriptyline (ELAVIL) 50 mg Tablet Take 1 tablet by mouth every day at bedtime. Indications: depression    BECLOMETHASONE DIPROPIONATE (QVAR INHA) 2 times daily.    ESTRACE 0.01 % (0.1 mg/gram) Vaginal Cream Insert into the vagina three times a week.    IBUPROFEN (MOTRIN PO) Take 600 mg by mouth if needed.    Meloxicam (MOBIC) 7.5 mg tablet Take 1 tablet by mouth once daily after a meal.    MULTIVITAMIN WITH MINERALS (MULTIVITAMIN & MINERAL FORMULA PO) every day.     No current facility-administered medications for this visit.        ALLERGIES:    Allergies   Allergen Reactions    Epinephrine Tachycardia    Sulfa (Sulfonamide Antibiotics) Anaphylaxis       REVIEW OF SYSTEMS:   There is no report of fever/ chills/ sweats; nausea/ vomiting/ diarrhea or changes in bowel or bladder function.       PHYSICAL  EXAMINATION:   BP 122/75  Pulse 67  Resp 16  Ht 1.638 m (5' 4.5")  Wt 70.3 kg (155 lb)  BMI 26.19 kg/m2   Constitutional:  Alert, ambulatory without assistance, NAD.            Psych: Oriented x 4  Mood/Affect pleasant.  Cardiovascular: non-edematous, brisk cap refill.   Respiratory: Breathing non-labored, non-dyspneic.   Skin: No evidence of rash or skin breakdown.   Musculoskeletal/Neurological:   Ambulation: Normal gait  Patient can heel and toe walk, can squat and rise without assistance  Tandem gait within normal limits  Romberg:  Not tested  Spine:  ttp tender over R SI joint      ROM in back grossly normal  Sensation: ILT or pinprick in L1 to S2 dermatomes   Reflexes: patellar 2+, achilles 2+ bilaterally  Clonus: negative    Strength: Lower Extremities   Movement  Hip    flexion  Knee   flexion  Knee    extension   Ankle    dorsiflexion  Ankle   plantar   flexion   Big toe    extension     Root  L1/ 2/ 3  L5/ S1/ S2  L2/ 3/ 4  L4/ 5  S1/ 2  L5/ S1    Nerve  femoral/    spinal n.  sciatic     femoral  deep    peroneal  tibial  deep    peroneal    Muscle  iliopsoas  hamstrings  quads  tibialis   anterior  gastrocnemius ,   soleus  EHL    Right   5/5   5-/5   5/5   5/5   5/5   5/5    Left   5/5   5/5   5/5   5/5   5/5   5/5     Foot drop Absent  Bilateral  SLR/ Slump test: positive on right      IMAGING/ DIAGNOSTICS:   Imaging reviewed with the patient today and showed :    X rays:  Grade 1 spondylolisthesis at L4/L5. There are degenerative changes in the posterior elements.     Lumbar MRI/ CT scan:   There is lumbar spinal stenosis at L3/4 and L4/5 with approximately 50% canal loss at those levels due to facet  and ligamentum hypertrophy. There is also mild neuroforamenal narrowing at these levels as well with the right more affected than the left    IMPRESSION/ RECOMMENDATIONS:  Erica Hancock presents today with lumbar spinal stenosis for 10 years that has been somewhat responsive to conservative  management. We will give her a prescription for Celebrex and Gabapentin 300tid for her pain. We have given her a prescription for Physical Therapy. She already has an appointment on Friday for a second spinal epidural. She can follow up as needed to evaluate the effectiveness of these interventions.    The patient was seen, evaluated and a care plan was developed with Dr. Roselie Awkward today.    Follow up in :   2-3 months PRN      J Rosalyn Gess, MD PGY-1  Pager: [phone]  //  [place]: [phone]  Department of Orthopaedic Surgery

## 2016-06-15 NOTE — Nursing Note (Signed)
Identified pt using Name and Date Of Birth.Vital signs taken, screened for pain. Allergies verified. Pharmacy updated. Micharl Helmes MA II.

## 2016-06-15 NOTE — Nursing Note (Signed)
Received patient's CD, uploaded images to [facility] outside server, and archived.

## 2016-06-15 NOTE — Nursing Note (Signed)
Patient roomed, chief complaint noted, allergies verified, blood pressure, pulse, respiration, temperature, and weight obtained, screened for pain, and pharmacy verified.  Criss Bartles, M.A.

## 2016-06-15 NOTE — Progress Notes (Signed)
SUBJECTIVE:  Patient is a 61 year old female who presents with a four-day history of a constant headache.  She describes the headache located throughout her head, of mild/moderate intensity, and nonpulsating.  She denies any associated phonophobia or photophobia.  She denies any nausea or vomiting.  She reports infrequent similar headaches with last being greater than 1 year ago.  Patient denies any dizziness, fainting, seizures, weakness, numbness, tingling, or tremors.    [place] and ROS have been reviewed with the patient.     Current Outpatient Prescriptions   Medication Sig Dispense Refill    Amitriptyline (ELAVIL) 50 mg Tablet Take 1 tablet by mouth every day at bedtime. Indications: depression 30 tablet 5    BECLOMETHASONE DIPROPIONATE (QVAR INHA) 2 times daily.      ESTRACE 0.01 % (0.1 mg/gram) Vaginal Cream Insert into the vagina three times a week.      IBUPROFEN (MOTRIN PO) Take 600 mg by mouth if needed.      Meloxicam (MOBIC) 7.5 mg tablet Take 1 tablet by mouth once daily after a meal. 30 tablet 3    MULTIVITAMIN WITH MINERALS (MULTIVITAMIN & MINERAL FORMULA PO) every day.       No current facility-administered medications for this visit.        OBJECTIVE:  BP 120/72  Pulse 68  Temp 37.1 C (98.8 F) (Temporal)  Resp 16  Ht 1.626 m (5' 4.02")  Wt 71.4 kg (157 lb 6.5 oz)  BMI 27.01 kg/m2    GENERAL: Pt. is well developed, well-nourished, in mild distress secondary to headache. Vital signs as documented.  HEENT: Head was normocephalic and atraumatic. Pupils were equally round and reactive to light and accommodation bilaterally. Extraocular movements were full without nystagmus. Hearing was good. TM's clear with good light reflex, minimal cerumen. Nares patent. Septum in midline. No sinus tenderness or discharge. Oropharynx revealed the tongue to be well papillated and moist. Uvula was midline. Throat was without injections or exudates.  NECK: Supple, trachea was midline. Thyroid normal in  size without nodules or tenderness. Carotid pulses were 2+/2+ bilaterally without bruits. Without cervical lymphadenopathy.  CHEST: Chest was symmetrical. Full respiratory excursions bilaterally. No retractions.  LUNGS: Lung fields were clear to auscultation bilaterally.  HEART: Regular rate and rhythm, normal S1 and S2, no murmur, no gallops.  NEUROLOGICAL:  Patient was awake, alert and oriented times 3. Cranial nerves 2 through 12 were present bilaterally without deficits. Deep tendon reflexes were 2+/2+ and equal bilaterally throughout. Plantar response was downgoing bilaterally. Sensation to soft touch (pin prick) was present throughout without deficits. Patient was able to exhibit finger-nose-finger and rapid alternating movements without difficulty. Romberg was negative. Muscle strength was 5+/5+ and equal bilaterally throughout. There was no evidence of atrophy or spontaneous movements.  EXTREMITIES: Patient was able to move all extremities well without limitations. There was no clubbing or cyanosis. No edema.  - Peripheral pulses were 2+/2+ and equal bilaterally throughout.  PSYCHIATRIC: Orientation - Oriented to time, place, person and situation. Appropriate mood and affect. Normal insight, Normal judgment. Memory - normal.      ASSESSMENT / PLAN:  1. Tension headache  Patient was administered a Toradol 60 mg IM injection.  Patient is to return to office as needed.  - [facility]OL[facility]J [facility]    Discussed risks:  - The importance of compliance with medications, referrals, POA and follow up were explained to the Patient and the Patient understands that failure to comply with advised directions can result in  significant morbidity and mortality.  - The patient verbalized an understanding of all instructions and plan.       Edison Simon. Tamala Julian, M.D., FAAFP  Fellow, American Academy of Family Medicine  Diplomate, American Board of [place]

## 2016-06-16 ENCOUNTER — Ambulatory Visit: Payer: Self-pay | Admitting: Family Medicine

## 2016-06-16 ENCOUNTER — Other Ambulatory Visit: Payer: Self-pay | Admitting: Orthopaedic Surgery of the Spine

## 2016-06-16 ENCOUNTER — Telehealth: Payer: Self-pay | Admitting: Family Medicine

## 2016-06-16 ENCOUNTER — Telehealth: Payer: Self-pay | Admitting: Orthopaedic Surgery of the Spine

## 2016-06-16 ENCOUNTER — Telehealth: Payer: Self-pay | Admitting: ANESTHESIOLOGISTS

## 2016-06-16 DIAGNOSIS — M8580 Other specified disorders of bone density and structure, unspecified site: Principal | ICD-10-CM

## 2016-06-16 NOTE — Telephone Encounter (Signed)
DEXA scan ordered.  Please advise.

## 2016-06-16 NOTE — Telephone Encounter (Signed)
Phone pt left a message to call back and get message bellow. Houda Brau MA

## 2016-06-16 NOTE — Telephone Encounter (Signed)
Pt is calling to see if Dr. Roselie Awkward wanted her to have another dexa scan, she had one done 01/2016 at [facility] in [place]. pts call back number [phone]

## 2016-06-16 NOTE — Telephone Encounter (Signed)
Patient calling in to request bone density test order.   She has been seeing pain center and was referred to surgeon from there.  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 First name possibly Roselie Awkward)  Upon speaking with surgeon, patient states they recommended she call here and request bone density scan.   Please advise.

## 2016-06-16 NOTE — Telephone Encounter (Signed)
Dear Dr. Luis Abed:    Erica Hancock was identified by name, date of birth, and mailing address.    The caller is (relationship): Self    Phone number (Company Name/Office): [phone]    Pharmacy (location selected):     Reason for Call: Pt states she went to PCP for headache yesterday 06/15/2016 and was given a TORADOL shot. 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 Pt states she stopped taking CELECOXIB 3 days ago and and would like to note she was prescribed gabapentin as of yesterday.    Action Requested: Please advise.    Thank you,  Quentin Cornwall   [facility]

## 2016-06-16 NOTE — Telephone Encounter (Signed)
Since Toradol is a nonsteroidal, no problem with that prior to caudal epidural.  In addition patient can continue with her celecoxib.

## 2016-06-16 NOTE — Telephone Encounter (Signed)
Pt called  Returned call gave message below.

## 2016-06-16 NOTE — Telephone Encounter (Signed)
Dr. Luis Abed-    Pt saw Dr. Ronette Deter 4/18 for a tension headache and was given toradol 60mg  IM. Pt is scheduled 4/20 for a caudal epidural.  Toradol is a 3 day hold.    Would you like to reschedule?    Please advise

## 2016-06-16 NOTE — Telephone Encounter (Signed)
Spoke to pt. Erica Hancock.     Relayed Dr. Loman Brooklyn message. Pt states understanding, but would rather cancel at this time. States Dr. Roselie Awkward started her on gabapentin and she'd like to trial that before another injection.    Scheduling please cancel caudal ES with PK tomorrow at 1350.    Thank you

## 2016-06-16 NOTE — Telephone Encounter (Signed)
cx'd appt

## 2016-06-17 ENCOUNTER — Ambulatory Visit

## 2016-06-17 NOTE — Telephone Encounter (Signed)
S/w patient explained she would need to contact Dr. Ronette Deter as he is provider that ordered the dexa scan

## 2016-06-24 ENCOUNTER — Ambulatory Visit

## 2016-06-24 NOTE — Progress Notes (Signed)
This patient was seen, evaluated, and care plan was developed with the resident.  I agree with the assessment and plan as outlined in the resident's note.  Report electronically signed by Cordarro Spinnato Figueroa Basil Buffin, MD. Attending

## 2016-06-29 ENCOUNTER — Ambulatory Visit: Payer: Self-pay

## 2016-07-01 ENCOUNTER — Ambulatory Visit

## 2016-07-06 ENCOUNTER — Ambulatory Visit

## 2016-07-07 ENCOUNTER — Ambulatory Visit: Payer: Self-pay | Admitting: Family Medicine

## 2016-07-11 ENCOUNTER — Encounter: Payer: Self-pay | Admitting: Family Medicine

## 2016-07-11 ENCOUNTER — Ambulatory Visit: Admitting: Family Medicine

## 2016-07-11 VITALS — BP 112/66 | HR 82 | Temp 98.3°F | Wt 160.3 lb

## 2016-07-11 DIAGNOSIS — J45909 Unspecified asthma, uncomplicated: Principal | ICD-10-CM

## 2016-07-11 DIAGNOSIS — M5416 Radiculopathy, lumbar region: Secondary | ICD-10-CM

## 2016-07-11 DIAGNOSIS — M25512 Pain in left shoulder: Secondary | ICD-10-CM

## 2016-07-11 DIAGNOSIS — M7532 Calcific tendinitis of left shoulder: Secondary | ICD-10-CM

## 2016-07-11 DIAGNOSIS — D485 Neoplasm of uncertain behavior of skin: Secondary | ICD-10-CM

## 2016-07-11 NOTE — Nursing Note (Signed)
Vital signs taken, allergies verified, screened for pain, tobacco hx verified, pharmacy verified.  Tyrion Glaude MA

## 2016-07-11 NOTE — Progress Notes (Signed)
Chief Complaint   Patient presents with    Shoulder Problem     left shoulder pain    Mole     check on right foot       Subjective:  Erica Hancock is a 61yr old female who presents with the following chief complaint:     16yr female here for follow-up several concerns:    Asthma.  Takes Qvar as needed.  Follow-up on PFTs.  Mild airflow restriction.  Reports gurgling lungs for 3-4 years after pneumonia - typically only hears it at night when meditating.  Needs to complete chest x-ray but wants to make sure covered by [employer].     Chronic left shoulder pain with calcific tendinitis.  MRI left shoulder ordered but wants to make sure covered by [employer].      Lumbar radicular pain.  Started on Celebrex per Dr Roselie Awkward at [facility] with improvement.  Did not tolerate Meloxicam well.      Concerned about changing itchy mole on inside of left lower leg.  Requesting biopsy. Paternal uncle on father's side.  History of basal cell carcinomas x 3.        Social History   Substance Use Topics    Smoking status: Former Smoker     Packs/day: 1.00     Years: 12.00     Types: Cigarettes     Quit date: 03/01/1979    Smokeless tobacco: Never Used    Alcohol use 3.6 oz/week     6 Glasses of wine per week       Patient Active Problem List   Diagnosis    Migraines    Insomnia    Arthritis of carpometacarpal (CMC) joint of right thumb    Asthma    HLD (hyperlipidemia)    Spondylolisthesis of lumbar region    Foraminal stenosis of lumbar region    Lumbar radicular pain    Calcific tendinitis of left shoulder    Pain in joint of left shoulder    Basal cell carcinoma of skin of nose    Basal cell carcinoma    Osteopenia    Recurrent major depressive disorder, in partial remission    Mixed hypercholesterolemia and hypertriglyceridemia     Current Outpatient Prescriptions on File Prior to Visit   Medication Sig Dispense Refill    Amitriptyline (ELAVIL) 50 mg Tablet Take 1 tablet by mouth every day at bedtime.  Indications: depression 30 tablet 5    BECLOMETHASONE DIPROPIONATE (QVAR INHA) 2 times daily.      Celecoxib (CELEBREX) 100 mg Capsule Take 1 capsule by mouth 2 times daily. 60 capsule 2    ESTRACE 0.01 % (0.1 mg/gram) Vaginal Cream Insert into the vagina three times a week.      Gabapentin (NEURONTIN) 300 mg Capsule Take 1 capsule by mouth 3 times daily. 90 capsule 2    IBUPROFEN (MOTRIN PO) Take 600 mg by mouth if needed.      Meloxicam (MOBIC) 7.5 mg tablet Take 1 tablet by mouth once daily after a meal. 30 tablet 3    MULTIVITAMIN WITH MINERALS (MULTIVITAMIN & MINERAL FORMULA PO) every day.       No current facility-administered medications on file prior to visit.      BP 112/66  Pulse 82  Temp 36.8 C (98.3 F) (Temporal)  Wt 72.7 kg (160 lb 4.4 oz)  BMI 27.09 kg/m2    OBJECTIVE:  General Appearance: healthy, alert, no distress, pleasant affect, cooperative.  Skin:  5 x 3 mm hyperpigmented lesion with central granulation tissue over left lateral distal lower extremity.  Mental Status: Mood (pt's report) :Mood pt's report, euthymic    ASSESSMENT AND PLAN AS DISCUSSED WITH THE PATIENT:      ICD-10-CM    1. Asthma, unspecified asthma severity, unspecified whether complicated, unspecified whether persistent J45.909    2. Pain in joint of left shoulder M25.512    3. Calcific tendinitis of left shoulder M75.32    4. Lumbar radicular pain M54.16    5. Neoplasm of uncertain behavior of skin of lower extremity D48.5      Reviewed and discussed PFTs.  Continue Qvar at this time.  Follow-up with chest x-ray and MRI shoulder results.  Follow-up with orthopedics and pain management as scheduled.  Medications updated.  Continue Celebrex as needed.  Concern for lower extremity changing itching skin lesion.  History of basal cell carcinomas.  Scheduled for biopsy later this week (5/18).      Discussed care and warning signs with Erica Hancock and all questions and concerns were fully answered. She will call or followup in  the office if any problems arise.     I have reviewed the patient's medical history in detail and updated the computerized patient record.    Barriers to Learning assessed: none.   Patient verbalizes understanding of teaching and instructions.    The patient and/or caregiver was educated regarding his/her medical care.   No guarantees were made regarding his/her medical care or treatment outcome.    I reviewed past medical, family/social and medication history during the visit.       Electronically signed by:    Otis Peak, DO  Board-Certified, American Board of Family Medicine  Associate Physician in [place]

## 2016-07-15 ENCOUNTER — Ambulatory Visit: Admitting: Family Medicine

## 2016-07-15 ENCOUNTER — Encounter: Payer: Self-pay | Admitting: Family Medicine

## 2016-07-15 VITALS — BP 114/60 | HR 85 | Temp 98.3°F | Wt 158.7 lb

## 2016-07-15 DIAGNOSIS — D485 Neoplasm of uncertain behavior of skin: Principal | ICD-10-CM

## 2016-07-15 DIAGNOSIS — L858 Other specified epidermal thickening: Secondary | ICD-10-CM

## 2016-07-15 NOTE — Nursing Note (Signed)
Vital signs taken, allergies verified, screened for pain, tobacco hx verified, pharmacy verified.  Jovonna Nickell MA

## 2016-07-19 NOTE — Progress Notes (Signed)
Chief Complaint   Patient presents with    Mole     removal       Subjective:  Erica Hancock is a 61yr old female who presents with the following chief complaint:     70yr female here for atypical nevus removal.  See previous evaluation on 07/11/16.  No significant change since last appointment.    Social History   Substance Use Topics    Smoking status: Former Smoker     Packs/day: 1.00     Years: 12.00     Types: Cigarettes     Quit date: 03/01/1979    Smokeless tobacco: Never Used    Alcohol use 3.6 oz/week     6 Glasses of wine per week       Patient Active Problem List   Diagnosis    Migraines    Insomnia    Arthritis of carpometacarpal (CMC) joint of right thumb    Asthma    HLD (hyperlipidemia)    Spondylolisthesis of lumbar region    Foraminal stenosis of lumbar region    Lumbar radicular pain    Calcific tendinitis of left shoulder    Pain in joint of left shoulder    Basal cell carcinoma of skin of nose    Basal cell carcinoma    Osteopenia    Recurrent major depressive disorder, in partial remission    Mixed hypercholesterolemia and hypertriglyceridemia     Current Outpatient Prescriptions on File Prior to Visit   Medication Sig Dispense Refill    Amitriptyline (ELAVIL) 50 mg Tablet Take 1 tablet by mouth every day at bedtime. Indications: depression 30 tablet 5    BECLOMETHASONE DIPROPIONATE (QVAR INHA) 2 times daily.      Celecoxib (CELEBREX) 100 mg Capsule Take 1 capsule by mouth 2 times daily. 60 capsule 2    ESTRACE 0.01 % (0.1 mg/gram) Vaginal Cream Insert into the vagina three times a week.       No current facility-administered medications on file prior to visit.      BP 114/60  Pulse 85  Temp 36.8 C (98.3 F) (Temporal)  Wt 72 kg (158 lb 11.7 oz)  BMI 26.83 kg/m2    OBJECTIVE:  General Appearance: healthy, alert, no distress, pleasant affect, cooperative.  Mental Status: Mood (pt's report) :Mood pt's report, euthymic  Skin: 6 x 3 mm hyperpigmented lesion with  central granulation tissue over left lateral distal lower extremity.    ASSESSMENT AND PLAN AS DISCUSSED WITH THE PATIENT:      ICD-10-CM    1. Neoplasm of uncertain behavior of skin of lower extremity D48.5 EXC SKIN BENIG 0.6-1CM TRUNK,ARM,LEG     DERMATOLOGY PATHOLOGY     DERMATOLOGY PATHOLOGY     CANCELED: SURGICAL PATHOLOGY     PROCEDURE: Elliptical excision    Pre-Procedure Checklist -     ID verified by name and date of birth.    Site(s) marked: verbally verified with patient.  Consent: patient filled written consent form prior to procedure. Risks and benefits discussed in detail.    History and Physical: available, reviewed and consistent with all documentation and studies  Special Equipment:  not applicable  Surgical/Procedure pause: yes  Patient concurs:  yes  Team present and concurs:  Provider and Psychologist, sport and exercise.     Lesion location: left lateral distal lower extremity  Length of excision: 8 mm.  Closure: simple. Pathology: Pending  Location verbally verified with the patient.  Discussed procedure in detail  including risks and benefits. Verbal and written consent obtained.  Area prepped with chlorhexidine. Anestethized with 1% lidocaine with epinephrine. An eliptical excisional biopsy was done using a #15 blade. 4-0 ethilon interrupted sutures were used to approximate the skin. . Patient tolerated procedure well. Bacitracin and bandaid applied. Wound care instructions descussed in details. Follow up if the are any complications. Sutures to be removed in 14 days.    Discussed care and warning signs with [name] and all questions and concerns were fully answered. She will call or followup in the office if any problems arise.     I have reviewed the patient's medical history in detail and updated the computerized patient record.    Barriers to Learning assessed: none.   Patient verbalizes understanding of teaching and instructions.    The patient and/or caregiver was educated regarding his/her medical care.    No guarantees were made regarding his/her medical care or treatment outcome.    I reviewed past medical, family/social and medication history during the visit.     Electronically signed by:    Otis Peak, DO  Board-Certified, American Board of Family Medicine  Associate Physician in [place]

## 2016-07-21 LAB — DERMATOLOGY PATHOLOGY

## 2016-07-22 ENCOUNTER — Ambulatory Visit

## 2016-07-26 ENCOUNTER — Ambulatory Visit: Admitting: Family Medicine

## 2016-07-26 ENCOUNTER — Encounter: Payer: Self-pay | Admitting: Family Medicine

## 2016-07-26 VITALS — BP 108/60 | HR 93 | Temp 98.2°F | Wt 159.4 lb

## 2016-07-26 DIAGNOSIS — Z9889 Other specified postprocedural states: Secondary | ICD-10-CM

## 2016-07-26 DIAGNOSIS — L03116 Cellulitis of left lower limb: Principal | ICD-10-CM

## 2016-07-26 MED ORDER — MUPIROCIN 2 % TOPICAL OINTMENT
TOPICAL_OINTMENT | TOPICAL | 0 refills | Status: DC
Start: 2016-07-26 — End: 2016-10-18

## 2016-07-26 NOTE — Nursing Note (Signed)
Vital signs taken, allergies verified, screened for pain, tobacco hx verified, pharmacy verified.  Anjolina Byrer MA

## 2016-07-26 NOTE — Progress Notes (Signed)
Chief Complaint   Patient presents with    Wound Healing/Infection     leg       Subjective:  Erica Hancock is a 61yr old female who presents with the following chief complaint:     6yr female here for follow-up left lower extremity excisional biopsy.    Pathology report reviewed and discussed.    FINAL DIAGNOSIS   SKIN, LEFT LEG, (SHAVE BIOPSY)            EPIDERMAL HYPERPLASIA WITH GRANULATION TISSUE AND SPARSE JUNCTIONAL MELANOCYTES     Reports initially wound healing very well.  Wore bandage for several days.  Developed some redness two days ago.  Went into river yesterday.  Husband took stitches out yesterday as it was looking worse.  Still red and minimally tender today.  Here for recheck.  No fevers, chills, sweats, nausea, vomiting, diarrhea.    Social History   Substance Use Topics    Smoking status: Former Smoker     Packs/day: 1.00     Years: 12.00     Types: Cigarettes     Quit date: 03/01/1979    Smokeless tobacco: Never Used    Alcohol use 3.6 oz/week     6 Glasses of wine per week       Patient Active Problem List   Diagnosis    Migraines    Insomnia    Arthritis of carpometacarpal (CMC) joint of right thumb    Asthma    HLD (hyperlipidemia)    Spondylolisthesis of lumbar region    Foraminal stenosis of lumbar region    Lumbar radicular pain    Calcific tendinitis of left shoulder    Pain in joint of left shoulder    Basal cell carcinoma of skin of nose    Basal cell carcinoma    Osteopenia    Recurrent major depressive disorder, in partial remission    Mixed hypercholesterolemia and hypertriglyceridemia     Current Outpatient Prescriptions on File Prior to Visit   Medication Sig Dispense Refill    Amitriptyline (ELAVIL) 50 mg Tablet Take 1 tablet by mouth every day at bedtime. Indications: depression 30 tablet 5    BECLOMETHASONE DIPROPIONATE (QVAR INHA) 2 times daily.      Celecoxib (CELEBREX) 100 mg Capsule Take 1 capsule by mouth 2 times daily. 60 capsule 2    ESTRACE  0.01 % (0.1 mg/gram) Vaginal Cream Insert into the vagina three times a week.       No current facility-administered medications on file prior to visit.      BP 108/60  Pulse 93  Temp 36.8 C (98.2 F) (Temporal)  Wt 72.3 kg (159 lb 6.3 oz)  BMI 26.94 kg/m2    OBJECTIVE:  General Appearance: healthy, alert, no distress, pleasant affect, cooperative.  Skin:  Skin color, texture, turgor normal. 8 mm excision site on left lower extremity with wound dehiscence and minimal erythema along border.  Mild tenderness to palpation.  No significant purulence or fluctuance.    ASSESSMENT AND PLAN AS DISCUSSED WITH THE PATIENT:      ICD-10-CM    1. Cellulitis of left lower extremity L03.116 Mupirocin (BACTROBAN) 2 % Ointment   2. Status post excisional biopsy Z98.890 Mupirocin (BACTROBAN) 2 % Ointment     Mild infection noted at wound site.  Sutures removed yesterday.  No evidence of cancer on pathology.  No systemic symptoms present.        Discussed diagnosis, course, treatment, management.  Discussed pertinent anatomy and physiology at length with patient.  Wound site irrigated and cleaned.  Bacitracin and steristrips applied.  Wound dressing placed.  Wound instructions given.  Rx Bactroban as needed for persistent infection.  Follow-up if not improved / worsened.  ER precautions given.      Discussed care and warning signs with Veora and all questions and concerns were fully answered. She will call or followup in the office if any problems arise.     I have reviewed the patient's medical history in detail and updated the computerized patient record.    Barriers to Learning assessed: none.   Patient verbalizes understanding of teaching and instructions.    The patient and/or caregiver was educated regarding his/her medical care.   No guarantees were made regarding his/her medical care or treatment outcome.    I reviewed past medical, family/social and medication history during the visit.       Electronically signed  by:    Otis Peak, DO  Board-Certified, American Board of Family Medicine  Associate Physician in [place]

## 2016-08-17 ENCOUNTER — Ambulatory Visit: Attending: ANESTHESIOLOGISTS | Admitting: ANESTHESIOLOGISTS

## 2016-08-17 VITALS — BP 130/70 | HR 75 | Temp 97.7°F | Resp 16 | Ht 64.0 in | Wt 158.7 lb

## 2016-08-17 DIAGNOSIS — M5417 Radiculopathy, lumbosacral region: Principal | ICD-10-CM | POA: Insufficient documentation

## 2016-08-17 NOTE — Progress Notes (Signed)
[facility]  Department of Anesthesiology and Pain Medicine  [address], [address]  [place], [place] [address]  Phone: [phone]    Fax: [phone]    Date: August 17, 2016    Re: Erica Hancock  MR#: [id number]  Date of Birth: [birth year]  Age: 61yr    Procedure:  Epidural Steroid - Caudal    See Procedure Note for Discussion and Recommendations    Dear Dr. Harley Alto, DO    We had the pleasure of treating your patient, Erica Hancock, today at the [facility].  As you know, the patient is a 61yr -old female with a chief complaint of:    Chief Complaint   Patient presents with    Leg Pain    Back Pain       [name] states that the pain problem started several month(s) ago.     She notes pain located in the areas indicated in the pain location diagram below.     Pain Location Diagram        [name] describes the pain as pins and needles, sharp, numbness, dull, aching and pressure    Pain Intensity (VAS): 0-10 Scale (0 = No Pain, 10 = Worst Imaginable Pain)  Currently, [name] has a VAS score of 6 / 10   On average, [name]'s pain score for the last week has been a VAS of 5 / 10     Patient Active Problem List   Diagnosis    Migraines    Insomnia    Arthritis of carpometacarpal (CMC) joint of right thumb    Asthma    HLD (hyperlipidemia)    Spondylolisthesis of lumbar region    Foraminal stenosis of lumbar region    Lumbar radicular pain    Calcific tendinitis of left shoulder    Pain in joint of left shoulder    Basal cell carcinoma of skin of nose    Basal cell carcinoma    Osteopenia    Recurrent major depressive disorder, in partial remission    Mixed hypercholesterolemia and hypertriglyceridemia       RESULTS OF MOST RECENT PROCEDURE: [name] underwent a Caudal epidural steroid injection - date 04/20/16. The procedure resulted in  80% relief for 3  months    PREVIOUS DIAGNOSTIC STUDIES: The lab/imaging results below were  reviewed.   X-ray of lumbar spine and MRI of lumbar spine    Lab Results   Lab Name Value Date/Time    WBC 5.2 05/30/2016 10:09 AM    HGB 14.3 05/30/2016 10:09 AM    HCT 42.4 05/30/2016 10:09 AM    PLT 319 05/30/2016 10:09 AM       Lab Results   Lab Name Value Date/Time    NA 139 05/30/2016 10:09 AM    K 4.3 05/30/2016 10:09 AM    CL 103 05/30/2016 10:09 AM    CO2 26 05/30/2016 10:09 AM    BUN 14 05/30/2016 10:09 AM    CR 0.87 05/30/2016 10:09 AM    GLU 90 05/30/2016 10:09 AM       No results found for: PGLU    Lab Results   Lab Name Value Date/Time    AST 26 05/30/2016 10:09 AM    ALT 29 05/30/2016 10:09 AM    ALP 50 05/30/2016 10:09 AM    ALB 4.2 05/30/2016 10:09 AM    TP  6.7 05/30/2016 10:09 AM    TBIL 0.4 05/30/2016 10:09 AM       Lab Results   Lab Name Value Date/Time    HGBA1C 5.6 05/30/2016 10:09 AM       No results found for: A1CPOC    No results found for: INR    No results found for: INR    No results found for: BNP    No components found for: TROPONIN1    Urine Toxicology  No results found for: DRUGGCSCRNU  No results found for: AMPHETSCRNU  No results found for: BENZOSSCRNU  No results found for: COCAINESCRNU  No results found for: BARBSSCRNU     [name] has not had an infection, fever, or chills in the past 1 week.   [name] has not taken any antibiotics in the past 1 week.     [name] does not have a history of problems with anesthesia in the past.     [name] had solid food equal to eight (8) hours ago.   [name] had clear liquids equal to two (2) hours ago.     She was questioned regarding a history of stridor, snoring, or sleep apnea. Ms. Backs states that she has a history of snoring.     Female Patients Only:  The pt states that there is not a possibility that she is pregnant    Outpatient Prescriptions Marked as Taking for the 08/17/16 encounter (Office Visit) with Amparo Bristol, MD   Medication Sig Dispense Refill    Amitriptyline (ELAVIL) 50 mg Tablet Take 1 tablet by  mouth every day at bedtime. Indications: depression 30 tablet 5    BECLOMETHASONE DIPROPIONATE (QVAR INHA) 2 times daily.      Celecoxib (CELEBREX) 100 mg Capsule Take 1 capsule by mouth 2 times daily. 60 capsule 2    ESTRACE 0.01 % (0.1 mg/gram) Vaginal Cream Insert into the vagina three times a week.      Mupirocin (BACTROBAN) 2 % Ointment Apply to affected area twice daily as needed for rash / skin infection. 22 g 0       The patient has not taken blood thinning medications such as ASA, NSAIDs, Plavix, Ticlid, and Coumadin.    Allergies   Allergen Reactions    Epinephrine Tachycardia    Sulfa (Sulfonamide Antibiotics) Anaphylaxis       Past Medical History:   Diagnosis Date    Asthma 02/11/2016    Basal cell carcinoma 04/14/2016    Basal cell carcinoma of skin of nose 04/14/2016    Depression     Mixed hypercholesterolemia and hypertriglyceridemia     Osteopenia 04/14/2016    Recurrent major depressive disorder, in partial remission 04/14/2016       Past Surgical History:   Procedure Laterality Date    INJECTION, STEROID      Right thumb    TONSILLECTOMY         Family History   Problem Relation Age of Onset    Hypertension Mother     Heart Disease Mother     Blood Disease Father     Breast Cancer Sister      dx age 80       Social History     Social History    Marital status: MARRIED     Spouse name: N/A    Number of children: N/A    Years of education: N/A     Occupational History    self  employed      Aeronautical engineer     Social History Main Topics    Smoking status: Former Smoker     Packs/day: 1.00     Years: 12.00     Types: Cigarettes     Quit date: 03/01/1979    Smokeless tobacco: Never Used    Alcohol use 3.6 oz/week     6 Glasses of wine per week    Drug use: No    Sexual activity: Not on file     Other Topics Concern    Not on file     Social History Narrative       [name] past medical, family, and social history were reviewed.    PHYSICAL EXAM:    Vitals:     08/17/16 1230   BP: 117/70   Pulse: 82   Resp: 16   Temp: 36.5 C (97.7 F)   TempSrc: Oral   SpO2: 98%   Weight: 72 kg (158 lb 11.7 oz)   Height: 1.626 m (5\' 4" )     Body mass index is 27.25 kg/(m^2).   Mallampati image  Airway - Required for Procedures: Mallampati class 3, Oral Eval: Mouth opening normal, neck ROM  full, Thyroid-Mentum distance in fingerbreaths  3  Respiratory: clear to auscultation.  Cardiovascular:  normal rate and regular rhythm, no murmurs, clicks, or gallops.    ASA Status:  2 - Mild, controlled systemic disease and no functional limitations    Preprocedural Diagnosis    ICD-10-CM    1. Lumbosacral radiculitis M54.17 EPIDURAL STEROID INJECTIONS        No orders of the defined types were placed in this encounter.      Informed Consent:  The patient's condition and proposed procedures, risks, and alternatives were discussed with the patient or responsible party.  The patient's / responsible party's questions were answered.   The patient / responsible party appeared to understand and chose to proceed.  Informed consent was obtained.  Procedural Pause:  A procedural pause verifying correct patient, medical record number, allergies, and surgical site was performed immediately prior to beginning the procedure.  The patient was instructed and educated on all aspects of the plan of care.  The patient acknowledged the plan of care.  I saw and evaluated the patient with my attending physician, Dr. Jerl Santos Morayma Godown, DO  Fellow  Department of Anesthesiology & Pain Medicine    Note Electronically Signed By:  Arna Snipe, DO

## 2016-08-17 NOTE — Progress Notes (Signed)
Procedure: Caudal Epidural Steroid Injection with Fluoroscopy       Epidural Steroid Injection # 2  Series # 1    Postprocedural Diagnosis: Same    Anesthesia - None  IV Fluids - None  Needle Type: - Spinal Needle -  22 gauge, 3.5 inch, bent tip   Contrast Dye - iohexol 180 mgI/ml - 1 ml   Injected Steroid Solution: dexamethasone 5 mg and pf normal saline 0.5 ml  Additional Medications Administered: none    Estimated Blood Loss - <2 ml  Drains: None  Specimens Removed: None  Urine Output - Not Measured  Complications: no apparent complications  Outcome: good    Informed Consent:  The patient's condition and proposed procedures, risks, and alternatives were discussed with the patient or responsible party.  The patient's / responsible party's questions were answered.   The patient / responsible party appeared to understand and chose to proceed.  Informed consent was obtained.    After obtaining written consent, an IV hep lock was placed. (See nurses notes for details).   Procedure in Detail:  The patient was taken back to the OR fluoroscopy suite and placed in a prone position with a pillow under the abdomen to decrease lordosis.  The skin overlying the lumbosacral area was prepped and draped in an aseptic fashion.  The sacral hiatus was identified with the aid of fluoroscopy.      Procedural Pause:  A procedural pause verifying correct patient, medical record number, allergies, medications to be administered, current vital signs, and surgical site was performed immediately prior to beginning the procedure.    The skin and subcutaneous tissue overlying the target site of injection was anesthetized using 3 ml of 1% lidocaine MPF with a 25-gauge, 1 -inch needle.   The above documented needle was then introduced into the epidural space via the sacral hiatus.  Needle position in the sacral canal was verified by lateral view on fluoroscopy.  A microbore extension tubing was attached to the needle to minimize any  movement of the needle during injection or syringe change. After negative aspiration for heme or CSF, the above noted contrast was injected. An epidurogram was confirmed using AP and lateral fluoroscopy.  After repeat negative aspiration for heme or CSF, the above noted steroid solution was slowly injected in increments. The needle was then retracted approximately halfway and the needle track was flushed with 0.5 ml of PF saline to clear the needle prior to removal. The needle was then removed.   The heart rate, pulse oximetry, and blood pressure were continuously monitored throughout the procedure (see Nursing Flowsheet for details).  The patient tolerated the procedure well.. The patient was carefully escorted to the recovery room in stable condition.  Patient was monitored by RN for recovery period. After meeting discharge criteria, the patient was discharged with driver.     DISCUSSION:  Conditions of the spine that result in spinal nerve root irritation such as disc protrusions, spinal stenosis, or post surgical radiculitis often respond favorably to epidural steroid injections.  The goal in performing epidural steroid injections is to provide relief from pain and permit greater function.     Today we performed a caudal epidural steroid injection.  The patient was informed that it may take several days for the epidural steroid medication to reach its full efficacy and she should continue her regular pain medications as prescribed.  The patient was advised to relax and avoid any heavy lifting or excessive bending for  the rest of the day.   She was advised that she may return to her usual activities after 24 hours if she is otherwise feeling well.    The patient was advised not to bathe or soak in water for 24 hours but that showering would be acceptable.   The patient was instructed that if she experienced fever or chills, new weakness, new sensory changes, any changes in bowel or bladder habits, worsening back  pain, new headache, neck stiffness, or other new symptoms, that she should contact the pain clinic immediately or dial 911 if unable to reach the pain clinic.  The patient has agreed not to travel out of the area for the next 4 days following the procedure so that she can be reevaluated if necessary.          RECOMMENDATIONS:  1. We will plan to have the patient follow up in 5-6 months for a repeat epidural steroid injection. If the patient is doing well prior to the next scheduled injection, the patient will contact us and reschedule to a later date.   2. No medications were prescribed at today's visit.  3. The patient has agreed not to travel out of the area for the next 4 days following the procedure so that she can be reevaluated if necessary.  4. Additional recommendations: If the patient fails to respond to today's epidural steroid injection- recommend repeat evaluation with spine surgery.           Informed Consent:  The patient's condition and proposed procedures, risks, and alternatives were discussed with the patient or responsible party.  The patient's / responsible party's questions were answered.   The patient / responsible party appeared to understand and chose to proceed.  Informed consent was obtained.  Procedural Pause:  A procedural pause verifying correct patient, medical record number, allergies, and surgical site was performed immediately prior to beginning the procedure.  The patient was instructed and educated on all aspects of the plan of care.  The patient acknowledged the plan of care.  I saw and evaluated the patient with my attending physician, Dr. Jerl Santos Bannie Lobban, DO  Fellow  Department of Anesthesiology & Pain Medicine    Note Electronically Signed By:  Arna Snipe, DO

## 2016-08-17 NOTE — Progress Notes (Signed)
Amount of time spent educating patient: 15 minutes    Education     Title: AMB (Adult) Pre Operative/Post Operative (Resolved)     Topic: Pre Operative/Post Operative Overview (Resolved)     Point: Description (Resolved)    Description: Description    Learning Progress Summary    Learner Readiness Method Response Comment Documented by Status   Patient Acceptance E VU  AQ 08/17/16 1329 Done               Point: Anatomy (Resolved)    Description: Anatomy    Learning Progress Summary    Learner Readiness Method Response Comment Documented by Status   Patient Acceptance E VU  AQ 08/17/16 1329 Done               Point: Physiology (Resolved)    Description: Physiology    Learning Progress Summary    Learner Readiness Method Response Comment Documented by Status   Patient Acceptance E VU  AQ 08/17/16 1329 Done               Point: Cause(s) (Resolved)    Description: Cause(s)    Learning Progress Summary    Learner Readiness Method Response Comment Documented by Status   Patient Acceptance E VU  AQ 08/17/16 1329 Done                Topic: Signs/Symptoms (Resolved)     Point: Preoperative Phase of Care (Resolved)    Description: Preoperative Phase of Care    Learning Progress Summary    Learner Readiness Method Response Comment Documented by Status   Patient Acceptance E VU  AQ 08/17/16 1329 Done               Point: Intraoperative Phase of Care (Resolved)    Description: Intraoperative Phase of Care    Learning Progress Summary    Learner Readiness Method Response Comment Documented by Status   Patient Acceptance E VU  AQ 08/17/16 1329 Done               Point: Postoperative Phase of Care (Resolved)    Description: Postoperative Phase of Care    Learning Progress Summary    Learner Readiness Method Response Comment Documented by Status   Patient Acceptance E VU  AQ 08/17/16 1329 Done                Topic: Treatment Plan (Resolved)     Point: Activity (Intraoperative): Positioning As Appropriate to Procedure (Resolved)     Description: Activity (Intraoperative): Positioning As Appropriate to Procedure    Learning Progress Summary    Learner Readiness Method Response Comment Documented by Status   Patient Acceptance E VU  AQ 08/17/16 1329 Done                Topic: Self-Management (Resolved)     Point: Preoperative Preparation Regimen (Resolved)    Description: Preoperative Preparation Regimen    Learning Progress Summary    Learner Readiness Method Response Comment Documented by Status   Patient Acceptance E VU  AQ 08/17/16 1329 Done               Point: Activity Restrictions: Avoid Driving (Resolved)    Description: Activity Restrictions: Avoid Driving    Learning Progress Summary    Learner Readiness Method Response Comment Documented by Status   Patient Acceptance E VU  AQ 08/17/16 1329 Done  Point: Activity Restrictions: Avoid Operating Machinery (Resolved)    Description: Activity Restrictions: Avoid Operating Machinery    Learning Progress Summary    Learner Readiness Method Response Comment Documented by Status   Patient Acceptance E VU  AQ 08/17/16 1329 Done               Point: Activity Restrictions: Avoid Signing Legal Documents For 24 Hours (Resolved)    Description: Activity Restrictions: Avoid Signing Legal Documents For 24 Hours    Learning Progress Summary    Learner Readiness Method Response Comment Documented by Status   Patient Acceptance E VU  AQ 08/17/16 1329 Done               Point: Dressing/Incision/Drain Care (Resolved)    Description: Dressing/Incision/Drain Care    Learning Progress Summary    Learner Readiness Method Response Comment Documented by Status   Patient Acceptance E VU  AQ 08/17/16 1329 Done               Point: Follow-Up With Doctor (Resolved)    Description: Follow-Up With Doctor    Learning Progress Summary    Learner Readiness Method Response Comment Documented by Status   Patient Acceptance E VU  AQ 08/17/16 1329 Done               Point: Infection Prevention/Monitoring  (Resolved)    Description: Infection Prevention/Monitoring    Learning Progress Summary    Learner Readiness Method Response Comment Documented by Status   Patient Acceptance E VU  AQ 08/17/16 1329 Done               Point: Pain Management (Resolved)    Description: Pain Management    Learning Progress Summary    Learner Readiness Method Response Comment Documented by Status   Patient Acceptance E VU  AQ 08/17/16 1329 Done                Topic: When to Seek Medical Attention (Resolved)     Point: Changes in sensation. Bladder/bowel dysfunction (Resolved)    Description:     Learning Progress Summary    Learner Readiness Method Response Comment Documented by Status   Patient Acceptance E VU  AQ 08/17/16 1329 Done               Point: Infection Sign/Symptom: Fever Greater Than 100.5 Degrees Fahrenheit/38 Degrees Celsius (Resolved)    Description: Infection Sign/Symptom: Fever Greater Than 100.5 Degrees Fahrenheit/38 Degrees Celsius    Learning Progress Summary    Learner Readiness Method Response Comment Documented by Status   Patient Acceptance E VU  AQ 08/17/16 1329 Done               Point: Persistent Pain (Resolved)    Description: Persistent Pain    Learning Progress Summary    Learner Readiness Method Response Comment Documented by Status   Patient Acceptance E VU  AQ 08/17/16 1329 Done               Point: Persistent Bleeding (Resolved)    Description: Persistent Bleeding    Learning Progress Summary    Learner Readiness Method Response Comment Documented by Status   Patient Acceptance E VU  AQ 08/17/16 1329 Done               Point: Signs/Symptoms of Infection (Resolved)    Description: Signs/Symptoms of Infection    Learning Progress Summary    Learner Readiness Method Response Comment Documented  by Status   Patient Acceptance E VU  AQ 08/17/16 1329 Done                      User Key     Initials Effective Dates Name Provider Type Discipline    AQ 10/30/14 -  Ulis Rias .NURSE: (RN or LVN) Interdisciplinary

## 2016-08-17 NOTE — Patient Instructions (Signed)
PAIN MANAGEMENT CLINIC  AFTER VISIT INSTRUCTIONS    PLEASE NOTIFY STAFF IF YOU HAVE A PACEMAKER, AICD, SPINAL CORD STIMULATOR OR OTHER IMPLANTABLE DEVICE BEFORE YOUR NEXT APPOINTMENT.      Pain Clinic [phone] or [phone]: available during business hours, 8 am - 5 pm  On-call physician [phone] THIS IS A NUMERICAL PAGER -DO NOT LEAVE VOICE MESSAGE.  Physician is available after business hours, 7 days per week including holidays.        Procedure Performed Today:     Epidural Steroid - Caudal  Fluoroscopy yes    If you experience any of the following symptoms after your procedure, please notify our office or on-call physician immediately (see above for contact information):   fever (increased oral temperature)   bleeding or swelling at the injection site,    drainage, rash or redness at the injection site    possible signs of infection    increased pain at the injection site   worsening of your usual pain   severe headache   new or worsening numbness    new arm and/or leg weakness, or    changes in bowel and/or bladder function: urinating or defecating on yourself and not knowing that you did it.      PLEASE FOLLOW ALL INSTRUCTIONS CAREFULLY     Do not drive or operate heavy machinery for 12 hours.   Do not engage in strenuous activity (e.g., lifting or pushing heavy objects or repeated bending) for 24 hours.     Do not take a bath, swim or use Jacuzzi for 24 hours after procedure. (A shower is fine).   Remove any Band-Aids when you get home.    Use cold/ice, as needed for comfort.  We recommend the use of cold therapy alternating on for 20 minutes, off for 20 minutes.    Do not apply direct heat (heating pad or heat packs) to the injection site for 24 hours.     Resume your usual medications, unless instructed otherwise by your Pain Physician.     If you are on warfarin (Coumadin) or other blood thinner, resume this  medication as instructed by your prescribing Physician.    Keep a record of your response to the injection you had today.     How much relief did you get?   Were you able to decrease the use of any of your pain medications?   Were you able to increase your level of activity?   How long did the relief last?     NOTE:  If your Pain Physician ordered a procedure, lab, diagnostic study (x-ray, MRI, CT, ultrasound, EMG, EKG, etc.), or specialty referral for you today, please be aware that some insurance carriers may require authorization before they can be scheduled. If these cannot be scheduled at the time of discharge today, you will be contacted with the scheduled appointment.  If you are not contacted within 3-5 business days, please call our office at [phone].          Procedure To Schedule FOR YOUR Next Visit: Epidural Steroid - Caudal  Fluoroscopy yes  VERY IMPORTANT  INSTRUCTIONS FOR YOUR FOLLOW UP APPOINTMENT  Please remember to arrive 15 minutes prior to your appointment time.  This will allow time for check in.  If you arrive after your scheduled appointment time your appointment could be rescheduled or your visit with the physician will be shortened.        NOTE: Please be aware of which location you are scheduled for your next appointment. The [facility] has 2 locations in [place]: (1) the [facility] at the [facility] and (2) the [facility] [address] [facility]. If you have any questions regarding the location of your next appointment, please call our office: [phone].      PLEASE FOLLOW ALL INSTRUCTIONS CAREFULLY     Please call to confirm the instructions and medication holds specific to your procedure, [phone].    If you are SICK, it is not safe to do your procedure. Therefore, please call us as soon as possible to reschedule. Since we do keep a waiting list, your  courtesy will allow us to schedule another patient. Please, call our office if you have any questions.    If you are NOT IN PAIN OR HAVE MINIMAL PAIN, please call to postpone your procedure (unless otherwise instructed by your Pain Physician).        MEDICATIONS    If your Pain Physician prescribes medications for you, please anticipate when you will require a refill.  It can take 1-3 business days to complete the process. Contact your pharmacy for medication refills. Your pharmacy will contact the Pain Clinic office with detailed medication information.         Driving When You Are Taking Medications    For most people, driving represents freedom, control and independence. Driving enables most people to get to the places they want or need to go. For many people, driving is important economically - some drive as part of their job or to get to and from work.   Driving is a complex skill. Our ability to drive or operate heavy or dangerous machinery safely can be affected by changes in our physical, emotional and mental condition. The goal of this brochure is to help you and your health care professional talk about how your medications may affect your ability to drive safely.     How can medications affect my driving or ability to operate heavy or dangerous machinery?   People take medications for a variety of reasons. Those can include:          allergies          anxiety          colds          depression          diabetes          heart and cholesterol conditions          high blood pressure          muscle spasms          pain          Parkinson's disease          schizophrenia   Medications may be prescribed by your doctor or purchased over-the-counter without a doctor's prescription. Many individuals also take herbal supplements. Some of these medications and supplements may cause a variety of reactions that may make it more difficult for you to  drive a car or operate heavy or dangerous machinery safely. These reactions may include:          sleepiness          blurred vision          dizziness          slowed movement            fainting          inability to focus or pay attention          nausea   Often people take more than one medication at a time. The combination of different medications can cause problems for some people. This is especially true for older adults because they take more medications than any other age group. Due to changes in the body as people age, older adults are more prone to medication related problems. The more medications you take, the greater your risk that your medicines will affect your ability to drive safely. To help avoid problems, it is important that at least once a year you talk to your doctor or pharmacist about all the medications - both prescription and over-the-counter - you are taking. Also let your professional know what herbal supplements, if any, you are taking. Do this even if your medications and supplements are not currently causing you a problem.     Can I still drive or operate heavy or dangerous machinery safely if I am taking medications?     Yes, most people can drive or operate heavy or dangerous machinery safely if they are taking medications. It depends on the effect those medications - both prescription and over-the-counter - have on your coordination and mental function. In some cases you may not be aware of the effects. But, in many instances, your doctor can help to minimize the negative impact of your medications on your driving or operate heavy or dangerous machinery in several ways. Your doctor may be able to:          adjust the dose;          adjust the timing of doses or when you take the medication;          add an exercise or nutrition program to lessen the need for medication; and          change medication to one that causes less drowsiness.     What can I do if I am taking medications?    Talk to your doctor honestly.   When your doctor prescribes a medicine for you, ask about side effects. How should you expect the medicine to affect your ability to drive or operate heavy or dangerous machinery? Remind your doctor of other medications - both prescription and over-the-counter - and herbal supplements you are taking, especially if you see more than one doctor. Talking honestly with your doctor also means telling the doctor if you are not taking all or any of the prescribed medication. Do not stop taking your medication unless your doctor tells you to.   Ask your doctor if you should drive - especially when you first take a medication.   Taking a new medication can cause you to react in a number of ways. It is recommended that you do not drive or operate heavy or dangerous machinery when you first start taking a new medication until you know how that drug affects you. You also need to be aware that some over-the-counter medicines and herbal supplements can make it difficult for you to drive or operate heavy or dangerous machinery safely.     Talk to your pharmacist.   Get to know your pharmacist. Ask the pharmacist to go over your medications with you and to remind you of effects they may have on your ability to drive or operate heavy or dangerous machinery safely. Be sure to request printed information about the side   effects of any new medication. Remind your pharmacist of other medicines and herbal supplements you are taking. Pharmacists are available to answer questions wherever you get your medications. Many people buy medicines by mail. Mail-order pharmacies have a toll-free number you can call and a pharmacist available to answer your questions about medications.   Monitor yourself.   Learn to know how your body reacts to the medications and supplements. Keep track of how you feel after you take the medication. For example, do you feel sleepy? Is your vision blurry? Do you feel weak and slow?  When do these things happen?   Let your doctor and pharmacist know what is happening.   No matter what your reaction is to taking a medicine - good or bad - tell your doctor and pharmacist. Both prescription and over-the-counter medications are powerful-that's why they work. Each person is unique. Two people may respond differently to the same medicine. If you are experiencing side effects, the doctor needs to know that in order to adjust your medication. Your doctor can help you find medications that work best for you.     What if I have to cut back or give up driving?   You can keep your independence even if you have to cut back or give up on your driving due to your need to take medications. It may take planning ahead on your part, but it will get you to the places you want to go and the people you want to see. Consider:          rides with family and friends;          taxi cabs;          shuttle buses or vans;          public buses, trains and subways; and          walking.   Also, senior centers and religious and other local service groups often offer transportation services for older adults in the community.     Thank you for choosing [facility].

## 2016-08-18 MED FILL — dexAMETHasone sodium phosphate (PF) 10 mg/mL injection solution: INTRAMUSCULAR | Qty: 1 | Status: AC

## 2016-08-18 MED FILL — sodium chloride 0.9 % injection solution: INTRAMUSCULAR | Qty: 10 | Status: AC

## 2016-08-18 NOTE — Progress Notes (Signed)
Informed Consent:  The patient's condition and proposed procedures, risks, and alternatives were discussed with the patient or responsible party.  The patient's / responsible party's questions were answered.   The patient / responsible party appeared to understand and chose to proceed.  Informed consent was obtained.    Procedural Pause:  A procedural pause verifying correct patient, medical record number, allergies, and surgical site was performed immediately prior to beginning the procedure.    I (Ardyth Kelso, MD) was physically present during key portion(s) of the procedure including the procedural pause and initiation of the procedure and during other times, was immediately available to return to the procedure on the date indicated in Dr. DiGirolamo's, the pain fellow's, note.  Please see EMR note for details.    I (Melena Hayes, MD) evaluated and examined the patient with the pain fellow on the date indicated in the fellow's note.  We developed the plan together as outlined in the pain fellow's note.   The patient was instructed and educated on all aspects of the plan of care. The patient acknowledged the plan of care.  Walida Cajas Gregory Leighla Chestnutt, MD  Attending Physician  Department of Anesthesiology & Pain Medicine  Note Electronically Signed By:  Nyelle Wolfson G. Shuntavia Yerby, MD  Professor of Anesthesiology and Pain Medicine  Division of Pain Medicine

## 2016-08-22 ENCOUNTER — Ambulatory Visit

## 2016-08-22 ENCOUNTER — Ambulatory Visit: Attending: ANESTHESIOLOGISTS | Admitting: ANESTHESIOLOGISTS

## 2016-08-22 ENCOUNTER — Telehealth: Payer: Self-pay | Admitting: Family Medicine

## 2016-08-22 ENCOUNTER — Telehealth: Payer: Self-pay | Admitting: ANESTHESIOLOGISTS

## 2016-08-22 VITALS — BP 112/59 | HR 88 | Temp 97.7°F | Resp 16 | Ht 64.0 in | Wt 162.7 lb

## 2016-08-22 DIAGNOSIS — M5416 Radiculopathy, lumbar region: Principal | ICD-10-CM | POA: Insufficient documentation

## 2016-08-22 DIAGNOSIS — Z9889 Other specified postprocedural states: Secondary | ICD-10-CM | POA: Insufficient documentation

## 2016-08-22 DIAGNOSIS — M461 Sacroiliitis, not elsewhere classified: Secondary | ICD-10-CM | POA: Insufficient documentation

## 2016-08-22 DIAGNOSIS — M7918 Myalgia, other site: Secondary | ICD-10-CM

## 2016-08-22 DIAGNOSIS — M791 Myalgia: Secondary | ICD-10-CM | POA: Insufficient documentation

## 2016-08-22 LAB — CBC NO DIFFERENTIAL
HEMATOCRIT: 41.5 % (ref 36.0–46.0)
Hemoglobin: 14.4 g/dL (ref 12.0–16.0)
MCH: 32.9 pg (ref 27.0–33.0)
MCHC: 34.7 % (ref 32.0–36.0)
MCV: 94.8 UM3 (ref 80.0–100.0)
MPV: 7.4 UM3 (ref 6.8–10.0)
PLATELET COUNT: 354 10*3/uL (ref 130–400)
RDW: 12 % (ref 0.0–14.7)
RED CELL COUNT: 4.38 10*6/uL (ref 4.00–5.20)
WHITE BLOOD CELL COUNT: 7.1 10*3/uL (ref 4.5–11.0)

## 2016-08-22 LAB — SED RATE WESTERGREN: SED RATE WESTERGREN: 2 mm (ref 0–30)

## 2016-08-22 LAB — C-REACTIVE PROTEIN: C-REACTIVE PROTEIN (MULTPLIER 0.1): 0.5 mg/dL (ref 0.1–0.8)

## 2016-08-22 NOTE — Progress Notes (Signed)
[facility]  Department of Anesthesiology and Pain Medicine  [address], [address]  [place], [place] [address]  Phone: [phone]    Fax [phone]    Date: August 22, 2016    Re: Erica Hancock  MR#: [id number]  Date of Birth: 05-07-1955  Age: 61yr   Requesting physician: AHarley Alto DO    Below is the summary of the visit with our mutual patient, Erica Hancock Thank you for your cooperation in her care.     Assessment and Differential Diagnosis:    ICD-10-CM    1. Lumbar radiculitis M54.16 MR L-SPINE WITHOUT CONTRAST        Orders Placed This Encounter    Mr L-Spine Without Contrast            Encounter Summary:  [name] a 61yrold female who  has a past medical history of Asthma (02/11/2016); Basal cell carcinoma (04/14/2016); Basal cell carcinoma of skin of nose (04/14/2016); Depression; Mixed hypercholesterolemia and hypertriglyceridemia; Osteopenia (04/14/2016); and Recurrent major depressive disorder, in partial remission (04/14/2016).   and [facility] pain that was radiating to the right hip.    Ms. BuRaigozaas last seen in our clinic on 08/17/2016 for a caudal epidural steroid injection. She reports that a day or two after the procedure was had a new pain, that was going into the right hip, with apparent swelling. She has been using ice to the area and Celebrex that was no not helping her pain. Per review of the chart, it appears patient had pain in this particular region prior to her caudal epidural steroid injection.     After the procedure she has denied any new onset weakness, numbness, or changes in sensation. She also denies any fevers, nausea, vomit ng, chills. There are no red flags symptoms such as loss of bowel or bladder, and she denied any saddle anesthesia.    On physical examination, she had 5/5 strength of the bilateral lower extremites, and  Reflexes/sensation. Were intact, and  symmetrical bilaterally. She had no gait instability, and was able to walk on her heels and toes. She appears to have a component of Sacroiliac joint pain on the left as demonstrated by positive PSIS tenderness on the right, as well aspositive FABERS, and Yeoman's test. She also is noted to have significant myofascial tenderness at the level of the gluteus muscles.     Our primary diagnosis remains Lumbar Radiculitis, concurrent with myofascial pain and sacroiliitis. Since patient has not had any relief of her normal pain following the procedure, we recommend that she follow-up with Dr. RoRoselie Awkwardnd Dr. KlMerry Lofty We will also obtain a thoracic/lumbar MRI as the patient continues to have worsening pain with no benefit from epidural steroid injection, ad well as physical therapy. In order to rule out infection of the epidural space we will also obtain a CBC, CRP and ESR, and these will be done today per the patient. In regards to the myofascial pain and sacroiliitis, needs a gentle steady guided re-conditioning program to address deconditioning and ongoing muscle tension with gentle gradual aerobic, stregnth and flexibility exercise with transition to ongoing daily exercise plan         RECOMMENDATIONS/TREATMENT PLAN:   1. Lumbar radiculitis  - Mr L-Spine Without Contrast  - CBC NO DIFFERENTIAL; Future  - C-REACTIVE PROTEIN; Future  - SED RATE  WESTERGREN; Future    2. Myofascial pain  Patient to resume physical therapy    3. Sacroiliitis  See number 2      DISPOSITION AFTER TODAY'S VISIT: We will plan to have Erica Hancock follow up with spinal surgeon             Dear Dr. Harley Alto, DO    It was a pleasure to see your patient, Erica Hancock, today in follow up at the [facility].  As you know, Erica Hancock is a 61yr-old female with a chief complaint of:    Chief Complaint   Patient presents with     Back Pain    Leg Pain         Erica Hancock here for a new problem of right gluteal pain, which started 1 week(s) ago. She associates the onset of pain with after a caudal epidural steroid injection .    Erica Hancock pain located in the areas indicated in the pain location diagram below.     Pain Location Diagram        ALLERGIES:    Allergies   Allergen Reactions    Sulfa (Sulfonamide Antibiotics) Anaphylaxis       CURRENT MEDICATIONS:     Outpatient Prescriptions Marked as Taking for the 08/22/16 encounter (Nursing/Ancillary Staff) with DCyndi Bender MD   Medication Sig Dispense Refill    Amitriptyline (ELAVIL) 50 mg Tablet Take 1 tablet by mouth every day at bedtime. Indications: depression 30 tablet 5    BECLOMETHASONE DIPROPIONATE (QVAR INHA) 2 times daily.      Celecoxib (CELEBREX) 100 mg Capsule Take 1 capsule by mouth 2 times daily. 60 capsule 2    ESTRACE 0.01 % (0.1 mg/gram) Vaginal Cream Insert into the vagina three times a week.      Mupirocin (BACTROBAN) 2 % Ointment Apply to affected area twice daily as needed for rash / skin infection. 22 g 0       STOPPED PAIN MEDICATIONS:   None    OPIOID MEDICATIONS  Erica Hancock asked if they are taking opioid medications such as hydrocodone (Vicodin, Norco, Lortab, Zohydro), hydromorphone (Dilaudid, Exalgo), fentanyl (Duragesic, Actiq, Fentora, Subsys), morphine (MS Contin, Kadian, Avinza), oxycodone (Percocet, Roxycodone, Oxycontin), methadone, buprenorphine (Suboxone, Subutex, BuTrans), oxymorphone (Opana), codeine (Tylenol #3 or #4), tramadol (Ultram), or tapentadol (Nucynta)               [name] no to current opioid use.    TIMING OF PAIN  Ms. BCoppockstates that the pain is present constantly (100% of the time)    PAIN QUALITY   [name] the pain as burning, sharp, cutting, throbbing and pressure    PAIN INTENSITY - Verbal Analog Scale (VAS): 0-10 Scale (0 = No Pain, 10 = Worst Imaginable Pain)    Currently, [name] has a VAS score of 7 / 10   On average, Erica Hancock pain score for the last week has been a VAS of 6 / 10   At its best, Erica Hancock VAS pain score is 6 / 10   At its worst, Erica Hancock's VAS pain score is 7 / 1[place] Ms. BDisbrowwas asked if any of the following activities either alleviates or exacerbates her pain: lying down, standing, sitting walking, exercise, medications, relaxation, thinking about something else, coughing/sneezing, urination, or bowel  movements    Ms. Flight states her pain:   is relieved by: lying down, medication     is aggravated by: standing, sitting, walking, exercise    FUNCTIONAL ASSESSMENT (Pain Disability Index):   Measures the level of disability related to pain: 0-10 Scale (0= No Disability, 2=Minimal, 5=Moderate, 8=Severe, 10= Total Disability)    Family and home responsibilities: activities related to home and family 8   Recreation: hobbies sports and other leisure time activities 8   Social activity: participation with friends and acquaintances other than family members 8   Occupation: activities partly or directly related to working, including housework or volunteering 8   Sexual behavior: frequency and quality of sex life 8   Self-care: personal maintenance and independent daily living (bathing, dressing etc.) 8   Life-support activity: basic life-supporting behaviors (eating, sleeping, breathing, etc.) 8     REVIEW OF SYSTEMS:  Ms. Easler was asked to review the list below and report if she was currently experiencing any of the symptoms:     Constitutional  o fever or chills   o unplanned weight loss    Eyes  o double or blurred vision    ENT  o hearing loss   o difficulty swallowing    Hematologic/Lymphatic  o bleeding gums   o low platelet count    Endocrine  o heat intolerance   o cold intolerance   o thyroid problems    Integumentary  o skin rash    Respiratory  o shortness of breath   o wheezing    Cardiac  o palpitations (awareness  of fast heart)   o chest pain    Gastrointestinal  o constipation   o abdominal pain   o nausea  o vomiting   o diarrhea    Genito-urinary  o sexual dysfunction   o urinary retention or difficulty urinating    Musculoskeletal  o back pain   o neck pain  o joint pain   o muscle pain    Neurological  o loss of consciousness or blackouts   o memory loss   o muscle weakness   o seizures   o trouble walking   o dizziness   o drowsiness    o excessive fatigue    Behavioral  o difficulty falling or remaining asleep   o loss of interest in hobbies or other activities   o difficulty concentrating   o feelings of guilt   o feeling depressed     Ms. Whitt ADMITS TO THE FOLLOWING OF THE ABOVE LISTED SYMPTOMS:  heat intolerance and back pain    All other systems were negative.    The Review of Systems was reviewed.    PAST MEDICAL, FAMILY, SOCIAL HISTORY:    Family Life: Living Circumstances:    Ms. Archibeque currently lives with their spouse      Past Medical History:   Diagnosis Date    Asthma 02/11/2016    Basal cell carcinoma 04/14/2016    Basal cell carcinoma of skin of nose 04/14/2016    Depression     Mixed hypercholesterolemia and hypertriglyceridemia     Osteopenia 04/14/2016    Recurrent major depressive disorder, in partial remission 04/14/2016       Past Surgical History:   Procedure Laterality Date    INJECTION, STEROID      Right thumb    TONSILLECTOMY         Family History   Problem Relation Age of  Onset    Hypertension Mother     Heart Disease Mother     Blood Disease Father     Breast Cancer Sister      dx age 15         PSYCHOSOCIAL HISTORY:     Social History     Social History    Marital status: MARRIED     Spouse name: N/A    Number of children: N/A    Years of education: N/A     Occupational History    self employed      Aeronautical engineer     Social History Main Topics    Smoking status: Former Smoker     Packs/day: 1.00     Years: 12.00     Types: Cigarettes     Quit date:  03/01/1979    Smokeless tobacco: Never Used    Alcohol use 3.6 oz/week     6 Glasses of wine per week    Drug use: No    Sexual activity: Not Asked     Other Topics Concern    None     Social History Narrative     The patient  reports that she quit smoking about 37 years ago. Her smoking use included Cigarettes. She has a 12.00 pack-year smoking history. She has never used smokeless tobacco.    Ms. Fatula past medical, family, and social history were reviewed.    PROBLEM LIST:  Patient Active Problem List   Diagnosis    Migraines    Insomnia    Arthritis of carpometacarpal (CMC) joint of right thumb    Asthma    HLD (hyperlipidemia)    Spondylolisthesis of lumbar region    Foraminal stenosis of lumbar region    Lumbar radicular pain    Calcific tendinitis of left shoulder    Pain in joint of left shoulder    Basal cell carcinoma of skin of nose    Basal cell carcinoma    Osteopenia    Recurrent major depressive disorder, in partial remission    Mixed hypercholesterolemia and hypertriglyceridemia       PREVIOUS DIAGNOSTIC STUDIES:   Images: The report of the image indicated below was reviewed. and The image noted below was viewed.   MRI of lumbar spine       Labs: The lab/study results below were reviewed.    Lab Results   Lab Name Value Date/Time    WBC 5.2 05/30/2016 10:09 AM    HGB 14.3 05/30/2016 10:09 AM    HCT 42.4 05/30/2016 10:09 AM    PLT 319 05/30/2016 10:09 AM       Lab Results   Lab Name Value Date/Time    NA 139 05/30/2016 10:09 AM    K 4.3 05/30/2016 10:09 AM    CL 103 05/30/2016 10:09 AM    CO2 26 05/30/2016 10:09 AM    BUN 14 05/30/2016 10:09 AM    CR 0.87 05/30/2016 10:09 AM    GLU 90 05/30/2016 10:09 AM       No results found for: PGLU    Lab Results   Lab Name Value Date/Time    AST 26 05/30/2016 10:09 AM    ALT 29 05/30/2016 10:09 AM    ALP 50 05/30/2016 10:09 AM    ALB 4.2 05/30/2016 10:09 AM    TP 6.7 05/30/2016 10:09 AM    TBIL 0.4 05/30/2016 10:09 AM       Lab Results  Lab Name Value Date/Time    HGBA1C 5.6 05/30/2016 10:09 AM       No results found for: A1CPOC    No results found for: INR    No results found for: INR    No results found for: BNP    No components found for: TROPONIN1    Urine Toxicology  No results found for: DRUGGCSCRNU  No results found for: AMPHETSCRNU  No results found for: BENZOSSCRNU  No results found for: COCAINESCRNU  No results found for: BARBSSCRNU      RESULTS OF MOST RECENT PROCEDURE: Ms. Spitler underwent a Caudal epidural steroid injection - date 08/17/2016. The procedure resulted in  70% relief for 2  days    PHYSICAL EXAM:    Vitals:    08/22/16 1539   BP: 112/59   Pulse: 88   Resp: 16   Temp: 36.5 C (97.7 F)   TempSrc: Oral   SpO2: 95%   Weight: 73.8 kg (162 lb 11.2 oz)   Height: 1.626 m ('5\' 4"' )     Body mass index is 27.93 kg/(m^2).   Constitutional: Normally developed, no deformities,well groomed.  Skin:  color normal.  Eyes:  conjunctivae and sclera normal.  Ears:  external inspection of ears show no abnormality.  Nose:  normal.  Respiratory: non-labored breathing.  Cardiovascular:  extremites well perfused.  Musculoskeletal: Gait/Station is not antalgic, Patient is able to heel walk and is able to toe walk  Neuro: Movement Assessment Lower limb  Bulk:  normal  Tone:   normal  Abnormal Movements:  no    Strength Movement Root Nerve Muscle   Bilateral 5 Hip flexion L1/2/3 Femoral and Spinal N. Iliopsoas   Bilateral 5 Hip adduction L2/3/4 Obturator Adductors   Bilateral 5 Knee flexion L5/S1/S2 Sciatic Hamstrings   Bilateral 5 Knee extension L2/3/4 Femoral Quadriceps   Bilateral 5 Ankle dorsiflexion L4/5 Deep peroneal Tibialis anterior   Bilateral 5 Ankle inversion L4/5 Tibial Nerve Tibialis Posterior   Bilateral 5 Ankle eversion L5/S1 Superficial peroneal Peronei   Bilateral 5 Ankle plantarflexion S1/S2 Tibial Gastrocnemius, soleus   Bilateral 5 Big toe extension L5/S1 Deep peroneal Extensor hallucis longus     Lower extremity reflexes: patella  - bilateral 2+, internal hamstring - bilateral 1+, achilles - bilateral 0+  Babinski- Toes are downgoing bilateral   Ankle clonus is not present bilateral    Sensory Assessment:  Sensory:  bilateral  lower extremities are intact and symmetrical light touch. There is no  allodynia in the bilateral lower extremity  Psych:Appearance/Cooperation: in no apparent distress  Attitude: pleasant   Behavior :normal  Eye Contact: normal  Attention Span: good  Speech: normal volume, rate, and pitch  Thought Process: logical and goal directed  Thought Content: no thought disorder or psychotic symptoms  General Statement: healthy and functional  Lumbar Spine:range of motion is full with flexion and is associated with no change in pain.  range of motion is full with extension and is associated with no change in pain.  range of motion is full with lateral flexion and is associated with no change in pain.        MEDICAL DECISION MAKING     Records Reviewed:   Lab/study reports reviewed above were important and necessary because subsequent medical and treatment recommendations required review of the above lab/study reports  Images viewed/reviewed above were important and necessary because subsequent medical and treatment recommendations required review of the above image(s)  The report by Dr.  Luis Abed dated 08/17/2016 was reviewed.  This was important and useful because the reviewed note clarified the reasons for the recommended treatment.     Review of the Risk of Comorbidities:  Other considerations in this patient's management include basal cell carcinoma, HLD    ASSESSMENT AND RECOMMENDATIONS/TREATMENT PLAN:   Please see the beginning of the note.       The patient was instructed and educated on all aspects of the plan of care.  The patient acknowledged the plan of care.  I saw and evaluated the patient with my attending physician, Dr. Sheilah Pigeon, DO  Fellow  Department of Anesthesiology & Pain Medicine    Note  Electronically Signed By:  Harriett Rush, DO

## 2016-08-22 NOTE — Telephone Encounter (Signed)
Confirmed with Dr Luis Abed and Anderson Malta about PPE . Pt will be seen today at 3 pm.   Called Pt and confirmed

## 2016-08-22 NOTE — Telephone Encounter (Signed)
Spoke to Pt and id x3. Pt states that on Saturday 08/20/16 Pt started to develop intense pain but not down the right leg or gluteal area as the normal pain that Pt has.  the Celebrex was not working any longer but Pt took a Tramadol tablet that belongs to husband . Pt reports that has new pain that radiates to right hip and that has swelling  in the right side of back below the waist line. Pt states that is been using ice. Denies any redness or swelling at the injection side or fever. Pt reports mentioning to Dr Luis Abed and Dr Edison Nasuti that 2 weeks prior to the injection has started to feel more pain up LB.   Could Pt be seen for a PPE?

## 2016-08-22 NOTE — Patient Instructions (Signed)
PAIN MANAGEMENT CLINIC  AFTER VISIT INSTRUCTIONS     Pain Clinic [phone] or [phone]: available during business hours, 8 am - 5 pm  On-call physician [phone] THIS IS A NUMERICAL PAGER -DO NOT [place].  Physician is available after business hours, 7 days per week including holidays.            Today you attended a post procedure evaluation    Your next appointment to schedule is a FOLLOW UP TO SURGERY  VERY IMPORTANT  INSTRUCTIONS FOR YOUR FOLLOW UP APPOINTMENT  Please remember to arrive 15 minutes prior to your appointment time.  This will allow time for check in.  If you arrive after your scheduled appointment time your appointment could be rescheduled or your visit with the physician will be shortened.      NOTE:  If your Pain Physician ordered a procedure, lab, diagnostic study (x-ray, MRI, CT, ultrasound, EMG, EKG, etc.), or specialty referral for you today, please be aware that some insurance carriers may require authorization before they can be scheduled. If these cannot be scheduled at the time of discharge today, you will be contacted with the scheduled appointment.  If you are not contacted within 3-5 business days, please call our office at [phone].        NOTE: Please be aware of which location you are scheduled for your next appointment. The [facility] has 2 locations in [place]: (1) the [facility] and (2) the [place] at the [employer] [facility]. If you have any questions regarding the location of your next appointment, please call our office: [phone].      If you are SICK please call us as soon as possible to reschedule. Since we do keep a waiting list, your courtesy will allow [place] to schedule another patient. Please, call our office if you have any  questions.        MEDICATIONS    If your Pain Physician prescribes medications for you, please anticipate when you will require a refill.  It can take 1-3 business days to complete the process. Contact your pharmacy for medication refills. Your pharmacy will contact the Pain Clinic office with detailed medication information.         Driving When You Are Taking Medications    For most people, driving represents freedom, control and independence. Driving enables most people to get to the places they want or need to go. For many people, driving is important economically - some drive as part of their job or to get to and from work.   Driving is a complex skill. Our ability to drive or operate heavy or dangerous machinery safely can be affected by changes in our physical, emotional and mental condition. The goal of this brochure is to help you and your health care professional talk about how your medications may affect your ability to drive safely.     How can medications affect my driving or ability to operate heavy or dangerous machinery?     People take medications for a variety of reasons. Those can include:          allergies          anxiety          colds          depression          diabetes  heart and cholesterol conditions          high blood pressure          muscle spasms          pain          Parkinson's disease          schizophrenia   Medications may be prescribed by your doctor or purchased over-the-counter without a doctor's prescription. Many individuals also take herbal supplements. Some of these medications and supplements may cause a variety of reactions that may make it more difficult for you to drive a car or operate heavy or dangerous machinery safely. These reactions may include:          sleepiness          blurred vision          dizziness          slowed movement          fainting          inability to focus or pay  attention          nausea   Often people take more than one medication at a time. The combination of different medications can cause problems for some people. This is especially true for older adults because they take more medications than any other age group. Due to changes in the body as people age, older adults are more prone to medication related problems. The more medications you take, the greater your risk that your medicines will affect your ability to drive safely. To help avoid problems, it is important that at least once a year you talk to your doctor or pharmacist about all the medications - both prescription and over-the-counter - you are taking. Also let your professional know what herbal supplements, if any, you are taking. Do this even if your medications and supplements are not currently causing you a problem.     Can I still drive or operate heavy or dangerous machinery safely if I am taking medications?     Yes, most people can drive or operate heavy or dangerous machinery safely if they are taking medications. It depends on the effect those medications - both prescription and over-the-counter - have on your coordination and mental function. In some cases you may not be aware of the effects. But, in many instances, your doctor can help to minimize the negative impact of your medications on your driving or operate heavy or dangerous machinery in several ways. Your doctor may be able to:          adjust the dose;          adjust the timing of doses or when you take the medication;          add an exercise or nutrition program to lessen the need for medication; and          change medication to one that causes less drowsiness.     What can I do if I am taking medications?   Talk to your doctor honestly.   When your doctor prescribes a medicine for you, ask about side effects. How should you expect the medicine to affect your ability to drive or operate heavy or dangerous machinery? Remind your doctor  of other medications - both prescription and over-the-counter - and herbal supplements you are taking, especially if you see more than one doctor. Talking honestly with your doctor also means telling the doctor if you are   not taking all or any of the prescribed medication. Do not stop taking your medication unless your doctor tells you to.   Ask your doctor if you should drive - especially when you first take a medication.   Taking a new medication can cause you to react in a number of ways. It is recommended that you do not drive or operate heavy or dangerous machinery when you first start taking a new medication until you know how that drug affects you. You also need to be aware that some over-the-counter medicines and herbal supplements can make it difficult for you to drive or operate heavy or dangerous machinery safely.     Talk to your pharmacist.   Get to know your pharmacist. Ask the pharmacist to go over your medications with you and to remind you of effects they may have on your ability to drive or operate heavy or dangerous machinery safely. Be sure to request printed information about the side effects of any new medication. Remind your pharmacist of other medicines and herbal supplements you are taking. Pharmacists are available to answer questions wherever you get your medications. Many people buy medicines by mail. Mail-order pharmacies have a toll-free number you can call and a pharmacist available to answer your questions about medications.   Monitor yourself.   Learn to know how your body reacts to the medications and supplements. Keep track of how you feel after you take the medication. For example, do you feel sleepy? Is your vision blurry? Do you feel weak and slow? When do these things happen?   Let your doctor and pharmacist know what is happening.   No matter what your reaction is to taking a medicine - good or bad - tell your doctor and pharmacist. Both prescription and over-the-counter  medications are powerful-that's why they work. Each person is unique. Two people may respond differently to the same medicine. If you are experiencing side effects, the doctor needs to know that in order to adjust your medication. Your doctor can help you find medications that work best for you.     What if I have to cut back or give up driving?   You can keep your independence even if you have to cut back or give up on your driving due to your need to take medications. It may take planning ahead on your part, but it will get you to the places you want to go and the people you want to see. Consider:          rides with family and friends;          taxi cabs;          shuttle buses or vans;          public buses, trains and subways; and          walking.   Also, senior centers and religious and other local service groups often offer transportation services for older adults in the community.     Thank you for choosing [facility].

## 2016-08-22 NOTE — Telephone Encounter (Signed)
Patient schedule today at 300 pm

## 2016-08-22 NOTE — Telephone Encounter (Signed)
Dear  Dr. Luis Abed:    Erica Hancock was identified by name, date of birth, and mailing address.    The caller is (relationship): Self    Phone number (Company Name/Office): [phone]         Reason for Call: patient was last seen on 08/17/2016 for Caudal Epidural Steroid Injection with Fluoroscopy with Dr. Luis Abed ([facility]). Patient states she is experiencing a lot of pain. Patient's pain level is 6/7 out of 10. Patient is requesting a call back.       Thank you,    Note Electronically Signed By:  Larence Penning

## 2016-08-22 NOTE — Telephone Encounter (Signed)
Patient called in today requesting a referral for her middle back having severe pain to pain management , and requesting a for a MRI for her back , please contact patient @ [phone].

## 2016-08-22 NOTE — Progress Notes (Signed)
Please see fellows note for details I agree with Dr Dyara note and findings.      This patient was seen, evaluated, and care plan was developed with the pain fellow, Dr. Dyara, on the date indicated in the fellow's note. I agree with the findings and plan as outlined in the pain fellow's note.  Greater than 50% of the 40 minutes with the patient was spent counseling regarding diagnostic results, impressions, and/or recommended diagnostic studies reviewed, counseled on long term prognosis and strategies for managing chronic pain, risks and benefits of recommended procedural interventions, instructions for management and/or follow-up and patient and family education  The patient was instructed and educated on all aspects of the plan of care.  The patient acknowledged the plan of care.    Note Electronically Signed By:     Zafirah Vanzee J Brittney Caraway, MD  Attending   Department of Anesthesiology & Pain Medicine

## 2016-08-22 NOTE — Telephone Encounter (Signed)
Patient has new mid area back pain.  Scheduled with Dr. Ronette Deter tomorrow.

## 2016-08-23 ENCOUNTER — Ambulatory Visit: Admitting: Family Medicine

## 2016-08-23 ENCOUNTER — Encounter: Payer: Self-pay | Admitting: Family Medicine

## 2016-08-23 VITALS — BP 111/64 | HR 86 | Temp 98.3°F | Wt 158.5 lb

## 2016-08-23 DIAGNOSIS — M5417 Radiculopathy, lumbosacral region: Principal | ICD-10-CM

## 2016-08-23 DIAGNOSIS — L03116 Cellulitis of left lower limb: Secondary | ICD-10-CM

## 2016-08-23 DIAGNOSIS — Z9889 Other specified postprocedural states: Secondary | ICD-10-CM

## 2016-08-23 MED ORDER — GABAPENTIN 100 MG CAPSULE
100.0000 mg | ORAL_CAPSULE | Freq: Every day | ORAL | 0 refills | Status: DC
Start: 2016-08-23 — End: 2016-10-18

## 2016-08-23 NOTE — Progress Notes (Signed)
Chief Complaint   Patient presents with    Back Pain     lower back       Subjective:  Erica Hancock is a 61yrold female who presents with the following chief complaint:     89y[place] here for follow-up low back pain.  Seen by Dr. KrLuis Abedt spine clinic for lumbosacral radiculitis.  Caudal epidural steroid injection given.  Pain relief for 2 days.  Developed excruciating pain.  Seen by pain management yesterday - normal CBC, CRP, ESR.  Taking Celebrex twice daily.  Does not tolerated Naproxen well.  Sleep improved on Tramadol.  Not improved with Tylenol.  Considering an inversion table.  Chronic aching pain radiating across sacrum through right hip.  Debilitating pain since Friday.  Radiation from sacrum to hip. Applying ice and using recliner.  Previous evaluation per neurosurgeons.  Trying to put off surgery if she can.  Concerned about being self-employed.  Considering disability.    Poor healing of left lower extremity s/p biopsy.  Reports she tends to heal slowly after procedures.  Resolution of infection and wound slowly closing.      Social History   Substance Use Topics    Smoking status: Former Smoker     Packs/day: 1.00     Years: 12.00     Types: Cigarettes     Quit date: 03/01/1979    Smokeless tobacco: Never Used    Alcohol use 3.6 oz/week     6 Glasses of wine per week       Patient Active Problem List   Diagnosis    Migraines    Insomnia    Arthritis of carpometacarpal (CMC) joint of right thumb    Asthma    HLD (hyperlipidemia)    Spondylolisthesis of lumbar region    Foraminal stenosis of lumbar region    Lumbar radicular pain    Calcific tendinitis of left shoulder    Pain in joint of left shoulder    Basal cell carcinoma of skin of nose    Basal cell carcinoma    Osteopenia    Recurrent major depressive disorder, in partial remission    Mixed hypercholesterolemia and hypertriglyceridemia     Current Outpatient Prescriptions on File Prior to Visit   Medication Sig  Dispense Refill    Amitriptyline (ELAVIL) 50 mg Tablet Take 1 tablet by mouth every day at bedtime. Indications: depression 30 tablet 5    BECLOMETHASONE DIPROPIONATE (QVAR INHA) 2 times daily.      Celecoxib (CELEBREX) 100 mg Capsule Take 1 capsule by mouth 2 times daily. 60 capsule 2    ESTRACE 0.01 % (0.1 mg/gram) Vaginal Cream Insert into the vagina three times a week.      Mupirocin (BACTROBAN) 2 % Ointment Apply to affected area twice daily as needed for rash / skin infection. 22 g 0     No current facility-administered medications on file prior to visit.      BP 111/64  Pulse 86  Temp 36.8 C (98.3 F) (Temporal)  Wt 71.9 kg (158 lb 8.2 oz)  BMI 27.21 kg/m2    OBJECTIVE:  General Appearance: healthy, alert, no distress, pleasant affect, cooperative.  Musculoskeletal:  Significant thoracic and lumbar paraspinal spasm.  Minimally reduced LS ROM noted. Seated straight leg raise is negative at 80 degrees on both sides. Fabre's negative.  Neuro: DTR's, motor strength and sensation normal, including heel and toe gait.   Skin:  Skin color, texture, turgor normal.  8 mm excision site on left lower extremity with wound dehiscence and minimal erythema along border.  Mild tenderness to palpation.  No significant purulence or fluctuance.      ASSESSMENT AND PLAN AS DISCUSSED WITH THE PATIENT:    (M54.17) Lumbosacral radiculitis  (primary encounter diagnosis)  Comment: Significant thoracolumbar muscle spasm in setting of lumbosacral radiculitis.  Established with spine clinic s/p epidural injection.    Plan: Gabapentin (NEURONTIN) 100 mg Capsule,         KETOROLAC INJ CLINIC        Discussed diagnosis, course, treatment, management.  Discussed pertinent anatomy and physiology at length with patient.        Discussed medication options.  Trial of Gabapentin 100 mg to 300 mg at bedtime.  Consider dosage increased.  Warned about side effects.  Toradol injection given today.  Patient tolerated well.       Discussed  supportive care, ice / heat, home exercises, analgesia as needed.        Proceed with Thoracic / Lumbar MRIs and follow-up with spine clinic as scheduled.    (L03.116) Cellulitis of left lower extremity  (L79.892) Status post excisional biopsy  Comment: Resolved cellulitis with slow healing of wound.   Plan: Reassurance given.  Discussed continued local wound care, bandage, antibiotics.  Follow-up if not improved / worsened.    Discussed care and warning signs with [name] and all questions and concerns were fully answered. She will call or followup in the office if any problems arise.     I have reviewed the patient's medical history in detail and updated the computerized patient record.    Barriers to Learning assessed: none.   Patient verbalizes understanding of teaching and instructions.    The patient and/or caregiver was educated regarding his/her medical care.   No guarantees were made regarding his/her medical care or treatment outcome.    I reviewed past medical, family/social and medication history during the visit.     This note was created using the support of [place] [employer] and/or Pharmacist, community. Please note any grammatical, sound-alike, or syntax errors as likely dictation errors.      Electronically signed by:    Otis Peak, DO  Board-Certified, American Board of Family Medicine  Associate Physician in [place]

## 2016-08-23 NOTE — Nursing Note (Signed)
Vital signs taken, allergies verified, screened for pain, tobacco hx verified, pharmacy verified.  Lilianna Case MA

## 2016-08-27 ENCOUNTER — Ambulatory Visit

## 2016-09-03 ENCOUNTER — Ambulatory Visit

## 2016-09-10 ENCOUNTER — Other Ambulatory Visit: Payer: Self-pay | Admitting: ORTHOPAEDIC TRAUMA

## 2016-09-11 ENCOUNTER — Ambulatory Visit (HOSPITAL_BASED_OUTPATIENT_CLINIC_OR_DEPARTMENT_OTHER)
Admission: RE | Admit: 2016-09-11 | Discharge: 2016-09-11 | Disposition: A | Discharge status: Home / Self Care | Source: Ambulatory Visit | Attending: Family Medicine | Admitting: Family Medicine

## 2016-09-11 ENCOUNTER — Ambulatory Visit (HOSPITAL_BASED_OUTPATIENT_CLINIC_OR_DEPARTMENT_OTHER)
Admission: RE | Admit: 2016-09-11 | Discharge: 2016-09-11 | Disposition: A | Discharge status: Home / Self Care | Source: Ambulatory Visit | Attending: ANESTHESIOLOGISTS | Admitting: ANESTHESIOLOGISTS

## 2016-09-11 ENCOUNTER — Ambulatory Visit
Admission: RE | Admit: 2016-09-11 | Discharge: 2016-09-11 | Disposition: A | Discharge status: Home / Self Care | Source: Ambulatory Visit | Attending: Neuroradiology | Admitting: Neuroradiology

## 2016-09-11 DIAGNOSIS — M19012 Primary osteoarthritis, left shoulder: Secondary | ICD-10-CM | POA: Insufficient documentation

## 2016-09-11 DIAGNOSIS — M1288 Other specific arthropathies, not elsewhere classified, other specified site: Secondary | ICD-10-CM

## 2016-09-11 DIAGNOSIS — M11012 Hydroxyapatite deposition disease, left shoulder: Secondary | ICD-10-CM | POA: Insufficient documentation

## 2016-09-11 DIAGNOSIS — M7582 Other shoulder lesions, left shoulder: Secondary | ICD-10-CM | POA: Insufficient documentation

## 2016-09-11 DIAGNOSIS — M75102 Unspecified rotator cuff tear or rupture of left shoulder, not specified as traumatic: Secondary | ICD-10-CM | POA: Insufficient documentation

## 2016-09-11 DIAGNOSIS — M48061 Spinal stenosis, lumbar region without neurogenic claudication: Secondary | ICD-10-CM | POA: Insufficient documentation

## 2016-09-11 DIAGNOSIS — M5136 Other intervertebral disc degeneration, lumbar region: Secondary | ICD-10-CM

## 2016-09-11 DIAGNOSIS — M5414 Radiculopathy, thoracic region: Principal | ICD-10-CM | POA: Insufficient documentation

## 2016-09-11 DIAGNOSIS — M25512 Pain in left shoulder: Principal | ICD-10-CM

## 2016-09-11 DIAGNOSIS — G8929 Other chronic pain: Principal | ICD-10-CM

## 2016-09-14 ENCOUNTER — Encounter: Payer: Self-pay | Admitting: Family Medicine

## 2016-09-14 ENCOUNTER — Other Ambulatory Visit: Payer: Self-pay | Admitting: Family Medicine

## 2016-09-14 DIAGNOSIS — M19012 Primary osteoarthritis, left shoulder: Secondary | ICD-10-CM | POA: Insufficient documentation

## 2016-09-14 DIAGNOSIS — M48061 Spinal stenosis, lumbar region without neurogenic claudication: Secondary | ICD-10-CM

## 2016-09-14 DIAGNOSIS — M5416 Radiculopathy, lumbar region: Secondary | ICD-10-CM

## 2016-09-14 DIAGNOSIS — M4316 Spondylolisthesis, lumbar region: Secondary | ICD-10-CM

## 2016-09-14 DIAGNOSIS — M75102 Unspecified rotator cuff tear or rupture of left shoulder, not specified as traumatic: Secondary | ICD-10-CM | POA: Insufficient documentation

## 2016-09-14 DIAGNOSIS — IMO0001 Reserved for inherently not codable concepts without codable children: Secondary | ICD-10-CM

## 2016-09-14 DIAGNOSIS — S46812D Strain of other muscles, fascia and tendons at shoulder and upper arm level, left arm, subsequent encounter: Principal | ICD-10-CM

## 2016-09-14 DIAGNOSIS — M75100 Unspecified rotator cuff tear or rupture of unspecified shoulder, not specified as traumatic: Secondary | ICD-10-CM

## 2016-09-14 HISTORY — DX: Unspecified rotator cuff tear or rupture of left shoulder, not specified as traumatic: M75.102

## 2016-09-14 HISTORY — DX: Primary osteoarthritis, left shoulder: M19.012

## 2016-09-15 ENCOUNTER — Telehealth: Payer: Self-pay | Admitting: ANESTHESIOLOGISTS

## 2016-09-15 NOTE — Telephone Encounter (Signed)
Open in error

## 2016-09-16 ENCOUNTER — Telehealth: Payer: Self-pay | Admitting: Family Medicine

## 2016-09-16 NOTE — Telephone Encounter (Signed)
Erica Hancock is a 61yr old female  3 patient identifiers used  Patient requestiing MRI test results.  Reviewed results  with patient. Questions were answered and patient has no further questions or concerns at this time. Advised to please callback to the advice line at any time if questions arise.   Ph. # for Ortho given.

## 2016-09-20 ENCOUNTER — Other Ambulatory Visit: Payer: Self-pay | Admitting: Family Medicine

## 2016-09-20 ENCOUNTER — Telehealth: Payer: Self-pay | Admitting: ANESTHESIOLOGISTS

## 2016-09-20 NOTE — Telephone Encounter (Signed)
Cyndi Bender, MD  Linard Millers, RN      Cc: Dorie Rank; Harley Alto, DO                Please forward a note to Dr Ronette Deter     Patient recently had an MRI  Please coordinate as patient has a referral to Surgery.     Please read Dr. Johna Roles note     Catha Brow MD         Previous Messages            MRI done on 09/11/2016. Thank you

## 2016-09-20 NOTE — Telephone Encounter (Signed)
The MRI result and the note was sent on 09/16/16 to Dr. Ronette Deter as requested.      Thank you,  Adelene Amas  [facility]

## 2016-09-20 NOTE — Telephone Encounter (Signed)
General Requests:    Requesting a refill of ESTRACE 0.01 % (0.1 mg/gram) with applicator.  The patient states that the dose is 0.01 % (0.1 mg/gram) and the instructions are Insert into the vagina three times a week. - VAGINALLY.  The patient requests Send to preferred pharmacy.    [[facility]:  Please inform that all medication requests should be made 3 business days in advance.  Advise patient once requested to allow for 48-72 business hours for processing.  Verify that the prescription was not already called in.  Route to MA pool.  MA:  Sort medication history list by date, select the last prescribed version of the medication. Pend refill and route to Provider.  If message is URGENT, MA is to notify Provider face to face about the request].

## 2016-09-21 MED ORDER — ESTRACE 0.01% (0.1 MG/GRAM) VAGINAL CREAM
TOPICAL_CREAM | VAGINAL | 3 refills | Status: DC
Start: 2016-09-21 — End: 2016-10-20

## 2016-09-21 NOTE — Telephone Encounter (Signed)
Patient was last seen with PCP on 08/23/16  Last filled 12/26/15  Ardine Eng, MA

## 2016-10-03 ENCOUNTER — Encounter: Payer: Self-pay | Admitting: Sports Medicine

## 2016-10-03 ENCOUNTER — Ambulatory Visit: Admitting: Sports Medicine

## 2016-10-03 VITALS — BP 114/79 | HR 84 | Ht 64.0 in | Wt 160.3 lb

## 2016-10-03 DIAGNOSIS — M7532 Calcific tendinitis of left shoulder: Secondary | ICD-10-CM

## 2016-10-03 DIAGNOSIS — M6780 Other specified disorders of synovium and tendon, unspecified site: Secondary | ICD-10-CM

## 2016-10-03 DIAGNOSIS — M7542 Impingement syndrome of left shoulder: Secondary | ICD-10-CM

## 2016-10-03 DIAGNOSIS — M6790 Unspecified disorder of synovium and tendon, unspecified site: Secondary | ICD-10-CM

## 2016-10-03 DIAGNOSIS — G8929 Other chronic pain: Principal | ICD-10-CM

## 2016-10-03 DIAGNOSIS — M25512 Pain in left shoulder: Principal | ICD-10-CM

## 2016-10-03 DIAGNOSIS — M7522 Bicipital tendinitis, left shoulder: Secondary | ICD-10-CM

## 2016-10-03 NOTE — Nursing Note (Signed)
Patient verified by 2 identifiers, vital signs taken, allergies verified, screened for pain.    Erica Santarelli, MA I

## 2016-10-10 NOTE — Progress Notes (Signed)
New Patient Orthopaedic Surgery Sports Medicine Clinic Note    Chief Complaint: She is here on referral from Dr. Thornell Mule today. She complains of left shoulder pain. This is been going on for about 4 years now. No inciting event or injury that she recalls. Typically her shoulder pain is made worse by extension she shows me reaching behind or up in front showing me forward flexion. She has no surgical treatment and as above no prior injury or trauma to the shoulder. She is right-hand dominant. The left shoulder bothers her she cannot swim right now which she has enjoyed. She typically goes to the gym and exercises and works out but the shoulder does limit her upper body exercising. She localizes pain anteriorly. No numbness tingling or weakness in the hand or fingers. She admits to having received a corticosteroid injection in January which did provide her with some relief although partial. She would like to get back to swimming if possible.    History of Present Complaint: As above    ROS MSK: as above.   ROS GEN: neg.   PMH, FH, SH and SurgH reviewed and are noncontributory except as noted above      Gen: Alert & oriented x3. No apparent distress. Mood and affect are appropriate. Permits physical examination & cooperates throughout the encounter.    Musculoskeletal Physical Exam: On physical exam today she has good motion she is within 10% in every plane of the contralateral right shoulder but she has pain in extension past about 20 and pain in forward flexion beyond about 100 or 110. Neer sign is positive as is speeds. Luan Pulling is equivocal. She has good strength with internal and external rotation today on exam. No crepitus with motion testing today..    Diagnostic Imaging: Plain film radiographs of the left shoulder from January 2018 are reviewed  But they are from [facility] and are not available on PACS.  MRI of the left shoulder from July 15 is reviewed. T2-weighted coronal's show some tendinosis  in the upper cuff moderate AC joint arthritis with caudally projecting osteophyte impinging on the musculotendinous junction at the far anterior supraspinatus what appears to be some synovitis or longitudinal splitting of the long head biceps just distal to the intertubercular groove. This can be seen on image 13. Axial sequences T2-weighted are reviewed again seen is AC degenerative changes with a small amount of marrow edema reactive cystic formation in the anterior aspect of the distal clavicle upper cuff appears intact she has a calcific deposit near the confluence of the infraspinatus and supraspinatus at the far lateral footprint. Glenohumeral joint appears healthy. Perhaps some degeneration of the anterior labrum. Fluid signal within the biceps sheath. Some tendinosis in the upper border the subscap. Perhaps early medial subluxation/medial translation of the biceps at the very upper border the subscap. Stage I fatty atrophy on the sagittal obliques with downward sloping anterolateral acromion and again tendinosis of the upper border subscap and biceps long head tendon.    Assessment:   Encounter Diagnoses   Code Name Primary?    M25.512, G89.29 Chronic left shoulder pain Yes    M75.42 Impingement syndrome of left shoulder     M75.32 Calcific tendinitis of left shoulder     M75.22 Biceps tendonitis, left     M67.90 Tendinosis          Plan I recommended a course of formal physical therapy for her and wrote an order for that today. I like to  see her back in 6-8 weeks if she is not sufficiently improved she may require surgical treatment in order to return to those higher demand overhead activities. She is thankful for the consult today and will follow-up as directed.    Electronically Signed by,  Kennith Center, DO  Orthopaedic Surgery, Sports Medicine  Portions of this note were dictated using speech-to-text software. Omissions and sentence fragmentation are unintentional.

## 2016-10-11 ENCOUNTER — Telehealth: Payer: Self-pay | Admitting: Family Medicine

## 2016-10-11 ENCOUNTER — Other Ambulatory Visit: Payer: Self-pay | Admitting: Family Medicine

## 2016-10-11 DIAGNOSIS — M5416 Radiculopathy, lumbar region: Secondary | ICD-10-CM

## 2016-10-11 DIAGNOSIS — M4316 Spondylolisthesis, lumbar region: Principal | ICD-10-CM

## 2016-10-11 DIAGNOSIS — M48061 Spinal stenosis, lumbar region without neurogenic claudication: Secondary | ICD-10-CM

## 2016-10-11 MED ORDER — AMITRIPTYLINE 50 MG TABLET
50.0000 mg | ORAL_TABLET | Freq: Every day | ORAL | 1 refills | Status: DC
Start: 2016-10-11 — End: 2017-02-07

## 2016-10-11 MED ORDER — CELECOXIB 100 MG CAPSULE
100.0000 mg | ORAL_CAPSULE | Freq: Two times a day (BID) | ORAL | 1 refills | Status: AC
Start: 2016-10-11 — End: 2017-01-09

## 2016-10-11 NOTE — Telephone Encounter (Signed)
**   Patient requesting 90 days supply **   Last office visit: 08-23-16 with PCP    Please advise.  Erica Hancock, [place]

## 2016-10-11 NOTE — Telephone Encounter (Signed)
3 identifiers were verified. Called patient and gave message per dr. Patient understood.  Erica Hancock, [place]

## 2016-10-11 NOTE — Telephone Encounter (Signed)
Referral placed. Please advise

## 2016-10-11 NOTE — Telephone Encounter (Signed)
Patient is requesting a spine specialist Dr Sh[facility] [address] [place] [address] p) [phone] f) [phone] dx spinal stenosis     [employer]

## 2016-10-18 ENCOUNTER — Encounter: Payer: Self-pay | Admitting: Family Medicine

## 2016-10-18 ENCOUNTER — Ambulatory Visit (INDEPENDENT_AMBULATORY_CARE_PROVIDER_SITE_OTHER)

## 2016-10-18 ENCOUNTER — Ambulatory Visit: Attending: Family Medicine | Admitting: Family Medicine

## 2016-10-18 VITALS — BP 120/69 | HR 89 | Temp 98.2°F | Wt 160.5 lb

## 2016-10-18 DIAGNOSIS — K5909 Other constipation: Principal | ICD-10-CM | POA: Insufficient documentation

## 2016-10-18 DIAGNOSIS — M8580 Other specified disorders of bone density and structure, unspecified site: Secondary | ICD-10-CM | POA: Insufficient documentation

## 2016-10-18 DIAGNOSIS — M5417 Radiculopathy, lumbosacral region: Secondary | ICD-10-CM | POA: Insufficient documentation

## 2016-10-18 DIAGNOSIS — Z1211 Encounter for screening for malignant neoplasm of colon: Secondary | ICD-10-CM | POA: Insufficient documentation

## 2016-10-18 DIAGNOSIS — F3341 Major depressive disorder, recurrent, in partial remission: Secondary | ICD-10-CM | POA: Insufficient documentation

## 2016-10-18 LAB — TSH WITH FREE T4 REFLEX: THYROID STIMULATING HORMONE: 4.35 u[IU]/mL — AB (ref 0.35–3.30)

## 2016-10-18 LAB — PHOSPHORUS (PO4): Phosphorus (PO4): 3.5 mg/dL (ref 2.4–5.0)

## 2016-10-18 LAB — CBC WITH DIFFERENTIAL
BASOPHILS % AUTO: 2.1 %
BASOPHILS ABS AUTO: 0.1 10*3/uL (ref 0.0–0.2)
EOSINOPHIL % AUTO: 1.9 %
EOSINOPHIL ABS AUTO: 0.1 10*3/uL (ref 0.0–0.5)
HEMATOCRIT: 41.4 % (ref 36.0–46.0)
Hemoglobin: 14.2 g/dL (ref 12.0–16.0)
LYMPHOCYTE ABS AUTO: 2.1 10*3/uL (ref 1.0–4.8)
LYMPHOCYTES % AUTO: 41.7 %
MCH: 33 pg (ref 27.0–33.0)
MCHC: 34.3 % (ref 32.0–36.0)
MCV: 96.3 UM3 (ref 80.0–100.0)
MONOCYTES % AUTO: 5.4 %
MONOCYTES ABS AUTO: 0.3 10*3/uL (ref 0.1–0.8)
MPV: 7.9 UM3 (ref 6.8–10.0)
NEUTROPHIL ABS AUTO: 2.5 10*3/uL (ref 1.8–7.7)
NEUTROPHILS % AUTO: 48.9 %
PLATELET COUNT: 347 10*3/uL (ref 130–400)
RDW: 12.2 % (ref 0.0–14.7)
RED CELL COUNT: 4.3 10*6/uL (ref 4.00–5.20)
White Blood Cell Count: 5.1 10*3/uL (ref 4.5–11.0)

## 2016-10-18 LAB — MAGNESIUM (MG): MAGNESIUM (MG): 2.2 mg/dL (ref 1.5–2.6)

## 2016-10-18 LAB — COMPREHENSIVE METABOLIC PANEL
ALANINE TRANSFERASE (ALT): 25 U/L (ref 5–54)
ALBUMIN: 4.3 g/dL (ref 3.2–4.6)
ALKALINE PHOSPHATASE (ALP): 52 U/L (ref 35–115)
ASPARTATE TRANSAMINASE (AST): 23 U/L (ref 15–43)
BILIRUBIN TOTAL: 0.4 mg/dL (ref 0.3–1.3)
CALCIUM: 9.4 mg/dL (ref 8.6–10.5)
CARBON DIOXIDE TOTAL: 25 mmol/L (ref 24–32)
CHLORIDE: 103 mmol/L (ref 95–110)
CREATININE BLOOD: 0.78 mg/dL (ref 0.44–1.27)
Glucose: 80 mg/dL (ref 70–99)
POTASSIUM: 4 mmol/L (ref 3.3–5.0)
Protein: 6.7 g/dL (ref 6.3–8.3)
SODIUM: 137 mmol/L (ref 135–145)
UREA NITROGEN, BLOOD (BUN): 19 mg/dL (ref 8–22)

## 2016-10-18 LAB — THYROXINE, FREE (FREE T4): THYROXINE, FREE (FREE T4): 0.64 ng/dL (ref 0.56–1.64)

## 2016-10-18 MED ORDER — TRAMADOL 50 MG TABLET
50.0000 mg | ORAL_TABLET | Freq: Every day | ORAL | 0 refills | Status: DC | PRN
Start: 2016-10-18 — End: 2016-12-19

## 2016-10-18 NOTE — Nursing Note (Signed)
Vital signs taken, allergies verified, screened for pain, tobacco hx verified, pharmacy verified.  Avaiyah Strubel MA

## 2016-10-18 NOTE — Patient Instructions (Addendum)
Recommendations:  For the constipation, push fluids, use Dulcolax oral and/or suppository until bowel movement occurs, then MiraLax twice a day to maintain soft bowel movement until normal pattern ensues. Maintain a high fiber diet with plenty of roughage, and 6-8 large glasses of water daily to avoid constipation in the future. Timing elimination to occur after meals, or after a hot drink, can also improve the situation long term. 1 or 2 Fleet enemas and or Magnesium Citrate may be necessary if you are not able to go after 5 days. Hydrate well with coconut water or pedyalyte if you do the Magnesium Citrate.        Constipation: Care Instructions  Your Care Instructions  Constipation means that you have a hard time passing stools (bowel movements). People pass stools from 3 times a day to once every 3 days. What is normal for you may be different. Constipation may occur with pain in the rectum and cramping. The pain may get worse when you try to pass stools. Sometimes there are small amounts of bright red blood on toilet paper or the surface of stools. This is because of enlarged veins near the rectum (hemorrhoids).  A few changes in your diet and lifestyle may help you avoid ongoing constipation. Your doctor may also prescribe medicine to help loosen your stool.  Some medicines can cause constipation. These include pain medicines and antidepressants. Tell your doctor about all the medicines you take. Your doctor may want to make a medicine change to ease your symptoms.  Follow-up care is a key part of your treatment and safety. Be sure to make and go to all appointments, and call your doctor if you are having problems. It's also a good idea to know your test results and keep a list of the medicines you take.  How can you care for yourself at home?   Drink plenty of fluids, enough so that your urine is light yellow or clear like water. If you have kidney, heart, or liver disease and have to limit fluids, talk with  your doctor before you increase the amount of fluids you drink.   Include high-fiber foods in your diet each day. These include fruits, vegetables, beans, and whole grains.   Get at least 30 minutes of exercise on most days of the week. Walking is a good choice. You also may want to do other activities, such as running, swimming, cycling, or playing tennis or team sports.   Take a fiber supplement, such as Citrucel or Metamucil, every day. Read and follow all instructions on the label.   Schedule time each day for a bowel movement. A daily routine may help. Take your time having your bowel movement.   Support your feet with a small step stool when you sit on the toilet. This helps flex your hips and places your pelvis in a squatting position.   Your doctor may recommend an over-the-counter laxative to relieve your constipation. Examples are Milk of Magnesia and MiraLax. Read and follow all instructions on the label. Do not use laxatives on a long-term basis.  When should you call for help?  Call your doctor now or seek immediate medical care if:   You have new or worse belly pain.   You have new or worse nausea or vomiting.   You have blood in your stools.  Watch closely for changes in your health, and be sure to contact your doctor if:   Your constipation is getting worse.   You  do not get better as expected.   Where can you learn more?   Go to http://blackburn.com/  Enter P343 in the search box to learn more about "Constipation: Care Instructions."    2006-2015 Health[employer]. Care instructions adapted under license by [facility]. This care instruction is for use with your licensed healthcare professional. If you have questions about a medical condition or this instruction, always ask your healthcare professional. Green any warranty or liability for your use of this information.  Content Version: 10.6.465758; Current as of: January 11, 2013

## 2016-10-18 NOTE — Progress Notes (Signed)
Chief Complaint   Patient presents with    Constipation     requesting labs    Back Pain       Subjective:  Erica Hancock is a 61yr old female who presents with the following chief complaint:     60yr female here for multiple concerns.    Planned evaluation down in [place] [facility] for lumbosacral radiculitis at recommendation of Dr. Bridgette Habermann.  Also,  considering another specialist in [place].  Reports intense back pain.  Taking Celebrex twice daily.  Interested in Tramadol 50 mg once daily.  Difficulty with standing for more than 1 hour.  Did not tolerate Gabapentin well.    Reports chronic constipation.  Present for many years.  Takes Miralax, Milk of Magnesia, psyllium husk and fiber supplements.  Avoids stool softeners.  Eating oatmeal, flaxseed, prune juice, V8 juice, high fiber foods including vegetables and fruits.  Takes calcium supplements and weight training to avoid decreased bone density.  Last DEXA scan in 2015 / 2016 showed osteopenia.  Due for updated DEXA scan to evaluate especially in light of upcoming surgery.  Last colonoscopy 12 years ago - normal.  Requesting referral.    Major depression, stable on Amitriptyline 50 mg at bedtime for depression.  Associated with constipation but concerned about trying new medication.  Significant effects on Prozac.      Social History   Substance Use Topics    Smoking status: Former Smoker     Packs/day: 1.00     Years: 12.00     Types: Cigarettes     Quit date: 03/01/1979    Smokeless tobacco: Never Used    Alcohol use 3.6 oz/week     6 Glasses of wine per week       Patient Active Problem List   Diagnosis    Migraines    Insomnia    Arthritis of carpometacarpal (CMC) joint of right thumb    Asthma    HLD (hyperlipidemia)    Spondylolisthesis of lumbar region    Foraminal stenosis of lumbar region    Lumbar radicular pain    Calcific tendinitis of left shoulder    Pain in joint of left shoulder    Basal cell carcinoma  of skin of nose    Basal cell carcinoma    Osteopenia    Recurrent major depressive disorder, in partial remission    Mixed hypercholesterolemia and hypertriglyceridemia    Tear of left supraspinatus tendon    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 Indications: depression 90 tablet 1    BECLOMETHASONE DIPROPIONATE (QVAR INHA) 2 times daily.      Celecoxib (CELEBREX) 100 mg Capsule Take 1 capsule by mouth 2 times daily. 180 capsule 1    ESTRACE 0.01 % (0.1 mg/gram) Vaginal Cream Insert into the vagina three times a week. 42.5 g 3    Gabapentin (NEURONTIN) 100 mg Capsule Take 1 capsule by mouth every day at bedtime. 30 capsule 0    Mupirocin (BACTROBAN) 2 % Ointment Apply to affected area twice daily as needed for rash / skin infection. 22 g 0     No current facility-administered medications on file prior to visit.      BP 120/69  Pulse 89  Temp 36.8 C (98.2  F) (Temporal)  Wt 72.8 kg (160 lb 7.9 oz)  BMI 27.55 kg/m2    OBJECTIVE:  GENERAL: alert and oriented, well developed, well nourished, pleasant 61 year old female, no acute distress  ENT: moist mucous membranes; nasal mucosa normal; oropharynx clear without inflammation or exudate  HEART: regular rate and rhythm   LUNGS: clear to auscultation bilaterally  ABDOMEN: soft, non-tender, non-distended, normoactive bowel sounds  MUSCULOSKELETAL: moves all extremities, capillary refill <3 seconds, no edema, cyanosis, clubbing     ASSESSMENT AND PLAN AS DISCUSSED WITH THE PATIENT:    (M54.17) Lumbosacral radiculitis  (primary encounter diagnosis)  Comment: Chronic.  Currently in the process of interviewing neurosurgeons for surgery.  Improved with Celebrex but inadequate pain relief.  Plan: Tramadol (ULTRAM) 50 mg Tablet        Discussed diagnosis, course,  treatment, management.  Discussed pertinent anatomy and physiology at length with patient.       Discussed pain management program through our clinic at [facility]  Rx Tramadol given.  Warned.  Patient continues medication for long-term use, she will be required to complete patient provider agreement with routine medication checks and surveillance.  Patient agrees to the plan.  Continue Celebrex at this time.  Patient may  also use Tylenol as needed.  Discussed supportive care, ice / heat, home exercises, analgesia as needed.    (K59.09) Chronic constipation  Comment: Chronic, present for many years.  Patient uncontrolled with multiple supportive care measures.  Interested in further lab workup and GI referral.  Plan: CBC WITH DIFFERENTIAL, COMPREHENSIVE METABOLIC         PANEL, TSH WITH FREE T4 REFLEX, MAGNESIUM (MG),        PHOSPHORUS (PO4), GASTROENTEROLOGY REFERRAL        Discussed pertinent anatomy and physiology at length with patient.         Screening labs ordered.  Follow-up with results.          Discussed supportive care, high-fiber diet, hydration, over-the-counter and pharmaceutical options.        Referral to GI for further evaluation / management.     (Z12.11) Screen for colon cancer  Comment: Due for colonoscopy.  Plan: GASTROENTEROLOGY REFERRAL    (M85.80) Osteopenia, unspecified location  Comment: Currently taking calcium, vitamin D, engaged in weightbearing exercises.  Patient due for updated DEXA scan especially in the context of upcoming lumbar spine surgery.  Plan: DEXA, COMPLETE          (F33.41) Recurrent major depressive disorder, in partial remission  Comment: Stable, on amitriptyline.  Patient declines other medication changes at this time due to other comorbid conditions.       Health Maintenance reviewed -see orders above.  Health Maintenance   Topic Date Due    Tdap  04/15/1965    Pneumococcal vaccine (1 of 1 - PPSV23) 12/13/1972    CERVICAL HPV TEST (WITH NEXT PAP)  12/14/1983     PAP-GYN CYTOLOGY  12/14/1983    Shingrix (Zoster) (1 of 2) 12/14/2003    COLONOSCOPY  12/14/2003    INFLUENZA  09/28/2016    MAMMOGRAM  03/23/2018    HEPATITIS C SCREENING  Addressed    HIV SCREENING  Addressed         Discussed care and warning signs with Erica Hancock and all questions and concerns were fully answered. She will call or followup in the office if any problems arise.     I have reviewed the patient's medical history in detail  and updated the computerized patient record.    Barriers to Learning assessed: none.   Patient verbalizes understanding of teaching and instructions.    The patient and/or caregiver was educated regarding his/her medical care.   No guarantees were made regarding his/her medical care or treatment outcome.    I reviewed past medical, family/social and medication history during the visit.     This note was created using the support of [employer] and/or Pharmacist, community. Please note any grammatical, sound-alike, or syntax errors as likely dictation errors.      Electronically signed by:    Otis Peak, DO  Board-Certified, American Board of Family Medicine  Associate Physician in [place]

## 2016-10-19 ENCOUNTER — Telehealth: Payer: Self-pay | Admitting: Family Medicine

## 2016-10-19 DIAGNOSIS — M48061 Spinal stenosis, lumbar region without neurogenic claudication: Secondary | ICD-10-CM

## 2016-10-19 DIAGNOSIS — M5416 Radiculopathy, lumbar region: Secondary | ICD-10-CM

## 2016-10-19 DIAGNOSIS — M4316 Spondylolisthesis, lumbar region: Principal | ICD-10-CM

## 2016-10-19 NOTE — Telephone Encounter (Signed)
Patient called in, HIPAA/3x identifiers verified. (Name, DOB, address, and/or last 4 of SSN)    Referral Request:    Patient is calling to request new referral to Neurosurgery regarding a problem of Spondylolisthesis of lumbar region, Foraminal stenosis of lumbar region, and Lumbar radicular pain. She requests a phone call back her insurance is [employer].     [MOSC:  Review if referral already exists. If so, let the patient know the status of the referral. MA:  Has patient been seen for this problem?  If yes, route to provider.  If not, request they make an appointment.]

## 2016-10-19 NOTE — Telephone Encounter (Signed)
Referral to neurosurgery placed.  Please advise.

## 2016-10-20 ENCOUNTER — Other Ambulatory Visit: Payer: Self-pay | Admitting: Family Medicine

## 2016-10-20 ENCOUNTER — Encounter: Payer: Self-pay | Admitting: Family Medicine

## 2016-10-20 DIAGNOSIS — R7989 Other specified abnormal findings of blood chemistry: Secondary | ICD-10-CM | POA: Insufficient documentation

## 2016-10-20 HISTORY — DX: Other specified abnormal findings of blood chemistry: R79.89

## 2016-10-20 MED ORDER — ESTRACE 0.01% (0.1 MG/GRAM) VAGINAL CREAM
TOPICAL_CREAM | VAGINAL | 3 refills | Status: DC
Start: 2016-10-20 — End: 2017-10-12

## 2016-10-20 NOTE — Telephone Encounter (Signed)
Patient was last seen with PCP on 10/18/16  Last filled 09/21/16  Ardine Eng, MA

## 2016-10-20 NOTE — Telephone Encounter (Signed)
General Requests:    Requesting a refill of ESTRACE  The patient states that the dose is 0.01 % (0.1 mg/gram) Vaginal Cream. and the instructions are Insert into the vagina three times a week..  The patient requests 90 day supply.    [MOSC:  Please inform that all medication requests should be made 3 business days in advance.  Advise patient once requested to allow for 48-72 business hours for processing.  Verify that the prescription was not already called in.  Route to MA pool.  MA:  Sort medication history list by date, select the last prescribed version of the medication. Pend refill and route to Provider.  If message is URGENT, MA is to notify Provider face to face about the request].

## 2016-11-07 ENCOUNTER — Ambulatory Visit: Attending: Neurological Surgery | Admitting: Neurological Surgery

## 2016-11-07 ENCOUNTER — Encounter: Payer: Self-pay | Admitting: Neurological Surgery

## 2016-11-07 VITALS — BP 113/75 | HR 81 | Temp 97.0°F | Resp 16 | Ht 64.0 in | Wt 160.1 lb

## 2016-11-07 DIAGNOSIS — M4316 Spondylolisthesis, lumbar region: Secondary | ICD-10-CM | POA: Insufficient documentation

## 2016-11-07 DIAGNOSIS — M5416 Radiculopathy, lumbar region: Secondary | ICD-10-CM | POA: Insufficient documentation

## 2016-11-07 DIAGNOSIS — M5417 Radiculopathy, lumbosacral region: Secondary | ICD-10-CM

## 2016-11-07 DIAGNOSIS — M48061 Spinal stenosis, lumbar region without neurogenic claudication: Principal | ICD-10-CM | POA: Insufficient documentation

## 2016-11-07 NOTE — Nursing Note (Signed)
Identified pt using Name and Date Of Birth.Vital signs taken, screened for pain. Allergies verified. Pharmacy updated. Cheri Ayotte MA II.

## 2016-11-07 NOTE — Progress Notes (Signed)
Neurosurgery Spine Clinic   Initial Consultation Note    Date of visit: 11/07/16  Patient seen today in [facility] for initial consultation for evaluation of low back pain and radicular pain    HISTORY OF PRESENT ILLNESS:   Erica Hancock is a 61yr old female with low back pain radiating down the legs bilaterally to the ankles that is occasionally associated with numbness in the same distribution. She states that these pains have become progressively worse with time. Her pain is 50% back and 50% leg. The back pain is exacerbated by activity and now makes it difficulty to sleep as she cant find a comfortable way to lay. She has had to begin taking tramadol for her pain at night because the celebrex is not covering her level of pain as much anymore. She feels that over the last three months her pain has become significantly worse and now prevents her from doing her activities of daily living. The patient has undergone injections in the past, however has been discharged from the pain clinic as they do not feel she will benefit from further injections.      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 09/14/2016    Tear of left supraspinatus tendon 09/14/2016       Past Surgical History:   Procedure Laterality Date    INJECTION, STEROID      Right thumb    TONSILLECTOMY         Social History     Social History    Marital status: MARRIED     Spouse name: N/A    Number of children: N/A    Years of education: N/A     Occupational History    self employed      Aeronautical engineer     Social History Main Topics    Smoking status: Former Smoker      Packs/day: 1.00     Years: 12.00     Types: Cigarettes     Quit date: 03/01/1979    Smokeless tobacco: Never Used    Alcohol use 3.6 oz/week     6 Glasses of wine per week    Drug use: No    Sexual activity: Not on file     Other Topics Concern    Not on file     Social History Narrative         Sulfa (Sulfonamide Antibiotics)    Anaphylaxis    @ACTDX @      Current Outpatient Prescriptions:     Amitriptyline (ELAVIL) 50 mg Tablet, Take 1 tablet by mouth every day at bedtime. Indications: depression, Disp: 90 tablet, Rfl: 1    BECLOMETHASONE DIPROPIONATE (QVAR INHA), 2 times daily., Disp: , Rfl:     Celecoxib (CELEBREX) 100 mg Capsule, Take 1 capsule by mouth 2 times daily., Disp: 180 capsule, Rfl: 1    ESTRACE 0.01 % (0.1 mg/gram) Vaginal Cream, Insert into the vagina three times a week., Disp: 42.5 g, Rfl: 3    Tramadol (ULTRAM) 50 mg Tablet, Take 1 tablet by mouth every day at bedtime  if needed for pain., Disp: 30 tablet, Rfl: 0    PHYSICAL EXAMINATION:     Neuro Exam:    Mental Status:  Answering appropriately in complete sentences.  Use of language: Fluent    Pronator drift: none   Gait is steady.     Motor:     LOWER LIMB  Symmetric bulk of the lower limbs.  ROM full at the bilateral lower limbs.  Adventitious movements: none   Sensation intact throughout the bilateral lower limbs.  DTR 2+ at the bilateral patellae and Achilles.        IIiopsoas Quadricep Adductor Abductor Anterior Tibialis EHL Gastrocnemius   RLE 5 5 5 5 5 5 5    LLE 5 5 5 5 5 5 5        RADIOLOGICAL STUDIES:  Upright flexion and extension XR of the lumbar spine is available for review which demonstrates an L4/5 grade I anterolisthesis with 24mm motion on flexion.    MRI of the lumbar spine demonstrates moderate to severe spinal stenosis at L3/4 level. There is a grade I anterolisthesis of L4/5.       IMPRESSION:   Patient was seen and evaluated by Dr. Charlann Noss who reviewed imaging, exam and symptoms with patient.  Erica Hancock  is a 61yr old female with low back pain and bilateral L4 radiculopathies secondary to L3/4 spinal stenosis and grade I L4/5 anterolisthesis with motion on dynamic imaging. Given the patients progressive worsening of pain, and failure of conservative management, and localization of symptoms to imaging pathology, surgery is indicated for decompression and for stabilization of the lumbar spine.      RECOMMENDATIONS:  Discussion - The above was discussed with the patient at today's visit.  We discussed natural history of the disease process and all questions were answered  - We discussed possible treatment measures including continued conservative treatment and surgical treatment options.  Plan:    o Medications / Procedures - No new medications were ordered today.    o Referrals - No new referrals are generated today.  o Follow up - Patient will be seen in hospital on the day of surgery unless the patient should have questions and wish to return to clinic earlier to discuss surgery.   o Risk stratification by PCP for OR.    Risk, Benefits and Alternatives    The procedure of Posterior lumbar four/five instrumented fusion, possible interbody with lumbar three/found and four/five laminectomies. Possible adjacent levels, was discussed with the patient.    The possible benefits of surgery were described as improvement in radicular pain and low back pain.    The alternatives were described as physical therapy, continued pain management with pain medications.    The risks of surgery were discussed in great detail. The risk of bleeding was discussed which may require blood transfusion. The risks of blood transfusion included allergic reaction or transmission of HIV of Hepatitis C. The risks of infection were discussed which could either be minor infection of the wound requiring oral antibiotics, or deeper infection of the soft tissue or hardware which could require surgery and long term antibiotics. The risk of neurologic  injury were discussed and would either be minor and include weakness, numbness, or complete paralysis of all limbs. The risk of bowel/bladder incontinence or retention was discussed. The risk of cerebral spinal fluid leak and its repair was discussed. The risk of adjacent level disease requiring future surgery was discussed. The risks of anesthesia such as heart  attack, stroke and death were also discussed.  The risk of no benefits from surgery were discussed    After the risks benefits and alternatives were discussed with the patient, the patient asked many intelligent questions and at this time wishes to proceed with surgery.      Robbi Garter D.O.  Neurosurgery Spine Fellow  11/07/2016 12:04

## 2016-11-09 NOTE — Progress Notes (Signed)
This patient was seen, evaluated, and care plan was developed with the Spine Fellow.  I agree with the assessment and plan as outlined in the fellow's note.  Report electronically signed by Icyss Skog Duk Farida Mcreynolds, MD. Attending

## 2016-11-15 ENCOUNTER — Other Ambulatory Visit: Payer: Self-pay

## 2016-11-15 ENCOUNTER — Encounter: Payer: Self-pay | Admitting: Neurological Surgery

## 2016-11-15 DIAGNOSIS — M48061 Spinal stenosis, lumbar region without neurogenic claudication: Principal | ICD-10-CM

## 2016-11-16 ENCOUNTER — Encounter: Payer: Self-pay | Admitting: NURSE PRACTITIONER

## 2016-11-16 ENCOUNTER — Other Ambulatory Visit: Payer: Self-pay | Admitting: NURSE PRACTITIONER

## 2016-11-16 DIAGNOSIS — Z01818 Encounter for other preprocedural examination: Principal | ICD-10-CM

## 2016-11-16 MED ORDER — MISCELLANEOUS MEDICATION
1.0000 | Freq: Every day | 0 refills | Status: AC
Start: 2016-11-16 — End: 2016-11-19

## 2016-11-22 ENCOUNTER — Other Ambulatory Visit: Payer: Self-pay | Admitting: NURSE PRACTITIONER

## 2016-11-22 DIAGNOSIS — Z01818 Encounter for other preprocedural examination: Principal | ICD-10-CM

## 2016-11-22 MED ORDER — MISCELLANEOUS MEDICATION
1.0000 | Freq: Once | 0 refills | Status: DC
Start: 2016-11-22 — End: 2017-01-30

## 2016-11-29 ENCOUNTER — Encounter: Payer: Self-pay | Admitting: Sports Medicine

## 2016-11-30 ENCOUNTER — Ambulatory Visit
Admission: RE | Admit: 2016-11-30 | Discharge: 2016-11-30 | Disposition: A | Discharge status: Home / Self Care | Source: Ambulatory Visit | Attending: Nuclear Medicine | Admitting: Nuclear Medicine

## 2016-11-30 DIAGNOSIS — M8588 Other specified disorders of bone density and structure, other site: Secondary | ICD-10-CM | POA: Insufficient documentation

## 2016-11-30 DIAGNOSIS — M8580 Other specified disorders of bone density and structure, unspecified site: Secondary | ICD-10-CM

## 2016-11-30 DIAGNOSIS — Z13820 Encounter for screening for osteoporosis: Principal | ICD-10-CM | POA: Insufficient documentation

## 2016-12-02 ENCOUNTER — Encounter: Payer: Self-pay | Admitting: Nurse Practitioner

## 2016-12-02 ENCOUNTER — Ambulatory Visit: Admitting: Nurse Practitioner

## 2016-12-02 VITALS — BP 133/84 | HR 84 | Temp 97.6°F | Resp 10 | Ht 63.78 in | Wt 158.7 lb

## 2016-12-02 DIAGNOSIS — Z23 Encounter for immunization: Secondary | ICD-10-CM

## 2016-12-02 DIAGNOSIS — Z1211 Encounter for screening for malignant neoplasm of colon: Secondary | ICD-10-CM

## 2016-12-02 DIAGNOSIS — K5900 Constipation, unspecified: Principal | ICD-10-CM

## 2016-12-02 MED ORDER — PEG3350 100 GRAM-SOD SULF 7.5 GRAM-NACL-KCL-ASCORBATE-C ORAL PWDR PACK
ORAL | 0 refills | Status: DC
Start: 2016-12-02 — End: 2017-02-16

## 2016-12-02 NOTE — Nursing Note (Signed)
Vitals signs taken, screened for pain, verified allergies and verified pharmacy.  Matayah Reyburn Carreau MA II

## 2016-12-02 NOTE — Patient Instructions (Signed)
Drink 2 quarts of water a day, more fiber in diet, exercise, and take citrucel daily (may increase to 2 times a day after one week). If still not effective add miralax daily OR milk of magnesia titrated to effect OR increase your magnesium tablet dose    You will be called within the next 10 days to schedule your colonoscopy .  If you do not receive a call within 10 days please call [phone].

## 2016-12-02 NOTE — Progress Notes (Signed)
GI Consultation note requested by: Harley Alto, DO     No chief complaint on file.      Subjective: Erica Hancock is a 61yr old female who presents for Chronic constipation, screening colonoscopy.      No nausea/vomiting/gerd (occasional)/diarrhea/hematochezia/melena/dysphagia    Constipation: Duration: most of life.  BM every: 2 times a week. Hard stools/straining: hard stools. Associated symptoms: lower abd pain.  Treatment: fiber in diet, mg daily; miralax daily not effective. Prior work-up: 2006 colonoscopy : no polyps per pt.     Weight: stable  Appetite: good  Prior endoscopy: see above  Transfusion history: no  IVDA: no  Blood donation history: no  Herbal/OTC medications not specified in medication list: mg, omega 3, mvi ? fe  NSAIDS: asa 4 times a week      ROS:  Constitutional: negative.  Eyes: negative .  Ears, Nose, Mouth, Throat: tinnitus.  CV: negative .  Resp: negative.  GU: negative.  Musculoskeletal: joint pain.  Integumentary: negative.  Neuro: numbness LE.  Psych: Mood pt's report, marital problems.  Endo: negative.  Heme/Lymphatic: negative.  Allergy/Immun: negative.  GYN: negative.    History:  I reviewed the patient's past medical and family/social history, and updated that information that was contributory .    Objective:    General Appearance: healthy, alert, no distress, pleasant affect, cooperative.  Eyes:  conjunctivae and corneas clear. PERRL  Mouth: normal.  Neck:  Neck supple. No adenopathy, thyroid symmetric, normal size.  Heart:  normal rate and regular rhythm, no murmurs, clicks, or gallops.  Abdomen:   Abdomen soft, non-tender.  No masses or organomegaly.  4 Extremities:  no cyanosis, clubbing, or edema.  Hydration: well hydrated.    Labs/imaging:  Performed at [facility], reviewed: see computer database.  Performed outside of [place], reviewed if applicable: NA    Impression/recommendations:   61 y/o female in NAD here for evaluation of:    1.  Lifelong constipation. Plan  fluid/fiber/exercise/fiber supp, add miralax OR MOM titrated to effect OR increase mg tablet dose    2.  crc screening, plan colonoscopy.     RTC: prn    Visit length: 35 minutes  >50 % of time spent counselling re: constipation, crc screening    Patient barriers to learning: none    Pt/family understanding: [place], NP

## 2016-12-07 ENCOUNTER — Telehealth: Payer: Self-pay | Admitting: Family Medicine

## 2016-12-07 DIAGNOSIS — R058 Other specified cough: Secondary | ICD-10-CM

## 2016-12-07 DIAGNOSIS — R05 Cough: Secondary | ICD-10-CM

## 2016-12-07 DIAGNOSIS — H9319 Tinnitus, unspecified ear: Principal | ICD-10-CM

## 2016-12-07 DIAGNOSIS — J3489 Other specified disorders of nose and nasal sinuses: Secondary | ICD-10-CM

## 2016-12-07 NOTE — Telephone Encounter (Signed)
Referral Request:    Patient is calling to request new referral to ENT regarding a problem of ringing in her ears, frequent coughing, sinus pressure. She requests a phone call back her insurance is [employer].     [MOSC:  Review if referral already exists. If so, let the patient know the status of the referral. MA:  Has patient been seen for this problem?  If yes, route to provider.  If not, request they make an appointment.]

## 2016-12-07 NOTE — Telephone Encounter (Signed)
Referral to ENT placed.  Please advise.

## 2016-12-16 ENCOUNTER — Ambulatory Visit: Payer: Self-pay | Admitting: Nurse Practitioner

## 2016-12-19 ENCOUNTER — Ambulatory Visit (INDEPENDENT_AMBULATORY_CARE_PROVIDER_SITE_OTHER): Admitting: Sports Medicine

## 2016-12-19 ENCOUNTER — Other Ambulatory Visit: Payer: Self-pay | Admitting: Family Medicine

## 2016-12-19 DIAGNOSIS — M5417 Radiculopathy, lumbosacral region: Principal | ICD-10-CM

## 2016-12-19 DIAGNOSIS — R05 Cough: Secondary | ICD-10-CM

## 2016-12-19 NOTE — Telephone Encounter (Signed)
Last seen by  primary care physician 10/18/2016.

## 2016-12-22 ENCOUNTER — Encounter: Payer: Self-pay | Admitting: Family Medicine

## 2016-12-23 ENCOUNTER — Telehealth: Payer: Self-pay | Admitting: Family Medicine

## 2016-12-23 ENCOUNTER — Encounter: Payer: Self-pay | Admitting: Family

## 2016-12-23 ENCOUNTER — Ambulatory Visit: Admitting: Family

## 2016-12-23 VITALS — BP 128/81 | HR 87 | Temp 98.6°F | Resp 16 | Wt 159.4 lb

## 2016-12-23 DIAGNOSIS — L710 Perioral dermatitis: Principal | ICD-10-CM

## 2016-12-23 MED ORDER — DOXYCYCLINE HYCLATE 100 MG CAPSULE
100.0000 mg | ORAL_CAPSULE | Freq: Two times a day (BID) | ORAL | 0 refills | Status: DC
Start: 2016-12-23 — End: 2017-05-12

## 2016-12-23 NOTE — Progress Notes (Signed)
Subjective:   6 weeks onset above left side of mouth and left side of face near lips.   Not improved with course of topical clotrimazole   Papules and blisters.   No new lotions or creams no new soaps   No cortisone creams         ROS:  Ent: no uri symptoms No sinus tenderness or upper tooth pain . No sore throat, headache or neck pain.  Pulm: no chest cough or congestion.     Objective:  Ga: in no apparent distress. BP 128/81 (SITE: right arm, Orthostatic Position: sitting, Cuff Size: regular)   Pulse 87   Temp 37 C (98.6 F) (Temporal)   Resp 16   Wt 72.3 kg (159 lb 6.3 oz)   BMI 27.55 kg/m     Skin: perioral dryness and papules beneath nose and to right of mouth.   koh prep negative for hyphae.  Other skin areas clear  Assessment/Plan:  (L71.0) Perioral dermatitis  (primary encounter diagnosis)  Comment:   Plan: Doxycycline 100 mg Capsule              Plan: see patient instructions also.  Advised to call back directly if there are further questions, or if these symptoms fail to improve as anticipated or worsen.  I did review patient's medical history.  Barriers to Learning assessed: none. Patient verbalizes understanding of teaching and instructions.  Overseeing provider: Dr Oren Binet    Electronically Signed By:  Darnelle Bos, [place]   Family Nurse Practitioner  PI# [id number]

## 2016-12-23 NOTE — Telephone Encounter (Signed)
DMV Disabled Placard Form:  Requesting a DMV Disabled Parking Placard or Plate form be completed.  Reason: back pain.   Patient needs the form completed by: 5-7 days.   When complete She requests to call the patient to have them pick up forms at the front desk.  I have placed the forms in Dr Roda Shutters mail box.     [[facility]:  Advised to please allow a minimum of 3 business days for form completion and have photo ID available upon pick up.   Route to MA pool].

## 2016-12-23 NOTE — Patient Instructions (Signed)
Doxycycline antibiotic twice a day for 10 days   Wait 30 minutes before lying down after taking this.   Can give you sun sensitivity    dove soap, eucerin or lubriderm lotion, no dryer sheets or fabric softener on pillow cases until resolved.    No other lotions or creams.

## 2016-12-23 NOTE — Nursing Note (Signed)
Patient roomed, chief complaint noted, allergies verified, blood pressure, pulse, respiration, temperature, and weight obtained, screened for pain, and pharmacy verified.  Erica Hancock, M.A.

## 2016-12-27 ENCOUNTER — Encounter: Payer: Self-pay | Admitting: Family Medicine

## 2016-12-27 ENCOUNTER — Ambulatory Visit: Admitting: Family Medicine

## 2016-12-27 VITALS — BP 112/67 | HR 100 | Temp 97.9°F | Resp 16 | Wt 159.4 lb

## 2016-12-27 DIAGNOSIS — M4316 Spondylolisthesis, lumbar region: Secondary | ICD-10-CM

## 2016-12-27 DIAGNOSIS — Z01810 Encounter for preprocedural cardiovascular examination: Principal | ICD-10-CM

## 2016-12-27 DIAGNOSIS — R9431 Abnormal electrocardiogram [ECG] [EKG]: Secondary | ICD-10-CM

## 2016-12-27 NOTE — Nursing Note (Signed)
EKG performed per doctors order.    Francis Dowse, MA        Vital signs taken, allergies verified, screened for pain,tobacco history verified, pharmacy verified. Patient given medication list for review.    Francis Dowse, MA

## 2016-12-27 NOTE — Patient Instructions (Signed)
Good luck with surgery!

## 2016-12-27 NOTE — Progress Notes (Signed)
SUBJECTIVE:    Erica Hancock is a 61yr female who presents with a chief complaint of "pre-op".   She is here for cardiac clearance.  She is scheduled for back surgery  with Dr. Maudie Mercury in [facility] on 01/24/2017.    Able to walk up 2 flights of stairs without developing chest pain:Yes  Normal exercise tolerance: Yes -- cannot exercise because of back  Recent changes to chronic medical problems:no    Cardiac risk factors:    Smoker:no  Hypertension:no  Diabetes:no  Prior heart disease:no  Family history of premature heart disease:yes - mother (61 years old with heart problem)    Lab Results   Lab Name Value Date/Time    CHOL 261 (H) 05/30/2016 10:09 AM    LDLC 145 (H) 05/30/2016 10:09 AM    HDL 50 05/30/2016 10:09 AM    TRIG 329 (H) 05/30/2016 10:09 AM       The 10-year ASCVD risk score Mikey Bussing DC Jr., et al., 2013) is: 4.3%    Values used to calculate the score:      Age: 61 years      Sex: Female      Is Non-Hispanic African American: No      Diabetic: No      Tobacco smoker: No      Systolic Blood Pressure: 160 mmHg      Is BP treated: No      HDL Cholesterol: 50 mg/dL      Total Cholesterol: 261 mg/dL         Current Outpatient Medications   Medication Sig Dispense Refill    Amitriptyline (ELAVIL) 50 mg Tablet Take 1 tablet by mouth every day at bedtime. Indications: depression 90 tablet 1    BECLOMETHASONE DIPROPIONATE (QVAR INHA) 2 times daily.      Celecoxib (CELEBREX) 100 mg Capsule Take 1 capsule by mouth 2 times daily. 180 capsule 1    Doxycycline 100 mg Capsule Take 1 capsule by mouth 2 times daily for 10 days. take with 8 oz of water 20 capsule 0    ESTRACE 0.01 % (0.1 mg/gram) Vaginal Cream Insert into the vagina three times a week. 42.5 g 3    Miscellaneous Medication Apply 1 bottle to the skin one time. Name of Medication: chlorhexidine gluconate solution liquid soap wash 2 evenings before surgery and morning of surgery 1 each 0    PEG 3350-Electrolytes-Vit C (MOVIPREP) 100-7.5-2.691 gram Powder in  Packet Take as directed on gi lab instruction sheet (may substitute with gavilyte-C) 1 packet 0    Tramadol (ULTRAM) 50 mg Tablet take 1 tablet by mouth at bedtime if needed for pain 30 tablet 0     No current facility-administered medications for this visit.        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    Elevated TSH     Allergies to medications: reviewed and updated  Medications: reviewed and updated  Social History     Tobacco Use    Smoking status: Former Smoker     Packs/day: 1.00     Years: 12.00     Pack years: 12.00     Types: Cigarettes     Last attempt to quit: 03/01/1979     Years since quitting: 37.8    Smokeless tobacco: Never Used   Substance Use Topics    Alcohol use: Yes     Alcohol/week: 3.6 oz     Types: 6 Glasses of wine per week     Comment: 1/day    Drug use: Yes     Types: Marijuana     Comment: 2-3 times a month     Family History   Problem Relation Name Age of Onset    Hypertension Mother      Heart Disease Mother      Blood Disease Father      Breast Cancer Sister          dx age 28     REVIEW OF SYSTEMS  General: no new tiredness, no weight loss or gain, no changes in appetite.  Head: no headaches, no dizziness or syncope.  Eyes: no visual changes.  Lungs: no prolonged cough, no dyspnea.  CV: no new exertional chest pain, no palpitations.     OBJECTIVE:  BP 112/67 (SITE: right arm,  Cuff Size: regular)   Pulse 100   Temp 36.6 C (97.9 F) (Temporal)   Resp 16   Wt 72.3 kg (159 lb 6.3 oz)   SpO2 97%   BMI 27.55 kg/m    General Appearance: healthy, alert, no distress, pleasant affect, cooperative.  Eyes:  Conjunctivae clear. PERRL, EOM's intact.   Neck:  Neck supple.    Heart:  normal rate and regular rhythm, no murmurs, clicks, or gallops.  Lungs: clear to auscultation     DATA:  Goldman assessment: Score: 0,  Class: 1, risk: 1.6%  EKG: slightly abnormal EKG, normal sinus rhythm, no previous tracings, RBBB.    ASSESSMENT:  (Z01.810) Pre-operative cardiovascular examination  (primary encounter diagnosis)  Plan: POC ELECTROCARDIOGRAM WITH RHYTHM STRIP            (M43.16) Spondylolisthesis of lumbar region (L4-5)  Plan: Continue care with Dr. Maudie Mercury.       Preoperative evaluation:   Stable and asymptomatic. Using the Revised cardiac risk index, Ms. Josten has an estimated very low risk of adverse outcome with non-cardiac surgery. The estimated rate of myocardial infarction, pulmonary embolism, ventricular fibrillation, cardiac arrest or complete heart block is 0.4%. As the patient is at baseline for function and currently there are no preoperative modifiable risk factors, my recommendation is to proceed with surgery without any additional CV evaluation.   Ok to hold ASA for surgery.           Forms completed: none    Barriers to learning assessed: none.    Ms. Savoia was given the opportunity to ask questions and these were addressed today. She verbalized understanding of teaching and instructions.    No guarantees were made regarding medical care or treatment outcome.    I reviewed pertinent past medical, family/social and medication history during the visit.               Electronically signed by:  Rayburn Felt,  M.D.  Diplomate, [place]     This note was created using Systems analyst. Some minor dictation errors  may be present despite editing. Grammatical, sound-alike, or syntax errors are likely dictation errors.

## 2016-12-27 NOTE — Telephone Encounter (Signed)
DMV form will be completed and ready for pick-up on Thursday November 1st.  Please advise.

## 2016-12-29 NOTE — Telephone Encounter (Signed)
Called patient, left voice mail to call office regarding forms. Please inform patient forms are completed and ready for pick up. Thank you.

## 2017-01-03 ENCOUNTER — Telehealth: Payer: Self-pay | Admitting: Family Medicine

## 2017-01-03 ENCOUNTER — Other Ambulatory Visit: Payer: Self-pay | Admitting: NURSE PRACTITIONER

## 2017-01-03 DIAGNOSIS — Z01818 Encounter for other preprocedural examination: Principal | ICD-10-CM

## 2017-01-03 LAB — POC ELECTROCARDIOGRAM WITH RHYTHM STRIP: QTC: 437

## 2017-01-03 NOTE — Telephone Encounter (Signed)
3 patient identifiers used.  Per:   patient.      ClearTriage Note: The patient has a neurosurgery procedure scheduled for 01/26/17, and the patient reported that she has had a skin infection around her nose (or fungus). The patient saw NP Tamala Julian on 12/23/16 with Dx of Perioral dermatitis, and she assessed the skin;  blisters and some broken and then would flake the skin away. NP Tamala Julian put her on Vibramycin, and only has one day of ABX left, the nose area looks okay, but the mouth area has bumps is read, and the flaking is not as bad. 6 weeks onset above left side of mouth and left side of face near lips. Not improved with course of topical clotrimazole The patient does not want to have any traces of a skin infection going into surgery and wants a message to be sent to the PCP.       Per:   patient verbalizes agreement to plan. Agrees to callback with any increase in symptoms/concerns or questions.       Disposition: Consult with MD: The patient would like to know if the PCP thinks that she should be prescribed additional Vibramycin or be sent a different prescription for his condition.    Thank you,  Catha Brow  PCN Triage Nurse

## 2017-01-03 NOTE — Telephone Encounter (Signed)
3 patient identifiers verified by the patient  Reviewed results and message from MD with patient. Questions were answered and patient has no further questions or concerns at this time. Advised to please callback to the advice line at any time if questions arise.    Zyan Coby Attles Arzella Rehmann  PCN Triage Nurse

## 2017-01-03 NOTE — Telephone Encounter (Signed)
Please have patient continue current antibiotics.  She may still be healing from the antibiotic therapy.  Please have patient follow-up if not improved / worsened.  It looks like she now has an appointment tomorrow.

## 2017-01-04 ENCOUNTER — Encounter: Payer: Self-pay | Admitting: Adult Medicine

## 2017-01-04 ENCOUNTER — Ambulatory Visit (INDEPENDENT_AMBULATORY_CARE_PROVIDER_SITE_OTHER)

## 2017-01-04 ENCOUNTER — Encounter: Payer: Self-pay | Admitting: Family Medicine

## 2017-01-04 ENCOUNTER — Ambulatory Visit: Attending: NURSE PRACTITIONER | Admitting: Adult Medicine

## 2017-01-04 ENCOUNTER — Ambulatory Visit: Admitting: Internal Medicine

## 2017-01-04 VITALS — BP 128/78 | HR 79 | Temp 98.1°F | Wt 158.5 lb

## 2017-01-04 DIAGNOSIS — L739 Follicular disorder, unspecified: Secondary | ICD-10-CM | POA: Insufficient documentation

## 2017-01-04 DIAGNOSIS — M4316 Spondylolisthesis, lumbar region: Secondary | ICD-10-CM

## 2017-01-04 DIAGNOSIS — Z01818 Encounter for other preprocedural examination: Principal | ICD-10-CM

## 2017-01-04 LAB — CBC NO DIFFERENTIAL
HEMATOCRIT: 43 % (ref 36.0–46.0)
HEMOGLOBIN: 14.6 g/dL (ref 12.0–16.0)
MCH: 32.5 pg (ref 27.0–33.0)
MCHC: 34 % (ref 32.0–36.0)
MCV: 95.6 UM3 (ref 80.0–100.0)
MPV: 8 UM3 (ref 6.8–10.0)
Platelet Count: 335 10*3/uL (ref 130–400)
RDW: 12.5 % (ref 0.0–14.7)
RED CELL COUNT: 4.5 10*6/uL (ref 4.00–5.20)
WHITE BLOOD CELL COUNT: 5.9 10*3/uL (ref 4.5–11.0)

## 2017-01-04 LAB — URINALYSIS AND CULTURE IF IND
BILIRUBIN URINE: NEGATIVE
GLUCOSE URINE: NEGATIVE mg/dL
KETONES: NEGATIVE mg/dL
NITRITE URINE: NEGATIVE
OCCULT BLOOD URINE: NEGATIVE mg/dL
PH URINE: 6 (ref 4.8–7.8)
PROTEIN URINE: NEGATIVE mg/dL
RBC: 1 /HPF (ref 0–5)
SPECIFIC GRAVITY, URINE: 1.01 (ref 1.002–1.030)
SQUAMOUS EPI: 1 /HPF (ref 0–5)
UROBILINOGEN.: NEGATIVE mg/dL (ref ?–2.0)
WBC: 2 /HPF (ref 0–5)

## 2017-01-04 LAB — BASIC METABOLIC PANEL
CALCIUM: 9.9 mg/dL (ref 8.6–10.5)
CARBON DIOXIDE TOTAL: 26 mmol/L (ref 24–32)
CHLORIDE: 101 mmol/L (ref 95–110)
CREATININE BLOOD: 0.77 mg/dL (ref 0.44–1.27)
GLUCOSE: 98 mg/dL (ref 70–99)
POTASSIUM: 4.4 mmol/L (ref 3.3–5.0)
SODIUM: 138 mmol/L (ref 135–145)
UREA NITROGEN, BLOOD (BUN): 19 mg/dL (ref 8–22)

## 2017-01-04 LAB — TYPE AND SCREEN
ANTIBODY SCREEN: POSITIVE
PATIENT BLOOD TYPE: A NEG

## 2017-01-04 LAB — APTT STUDIES: APTT: 29.6 s (ref 24.1–36.7)

## 2017-01-04 LAB — ANTIBODY IDENTIFIED

## 2017-01-04 LAB — INR: INR: 0.93 (ref 0.87–1.18)

## 2017-01-04 NOTE — Telephone Encounter (Signed)
Patient picked up DMV forms in office, identification x3, ID verified.

## 2017-01-04 NOTE — Nursing Note (Signed)
vital signs taken,allergies and pharmacy verified,screened for pain.

## 2017-01-04 NOTE — Progress Notes (Signed)
Erica Hancock is a 61yr old female   Chief Complaint   Patient presents with    Skin Problem     skin rash or infection around nose and upper lip follow up from Erica Hancock      Patient Active Problem List   Diagnosis    Migraines    Insomnia    Arthritis of carpometacarpal (CMC) joint of right thumb    Asthma    HLD (hyperlipidemia)    Spondylolisthesis of lumbar region    Foraminal stenosis of lumbar region    Lumbar radicular pain    Calcific tendinitis of left shoulder    Pain in joint of left shoulder    Basal cell carcinoma of skin of nose    Basal cell carcinoma    Osteopenia    Recurrent major depressive disorder, in partial remission (Erica Hancock)    Mixed hypercholesterolemia and hypertriglyceridemia    Tear of left supraspinatus tendon    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 Indications: depression 90 tablet 1    BECLOMETHASONE DIPROPIONATE (QVAR INHA) 2 times daily.      Celecoxib (CELEBREX) 100 mg Capsule Take 1 capsule by mouth 2 times daily. 180 capsule 1    ESTRACE 0.01 % (0.1 mg/gram) Vaginal Cream Insert into the vagina three times a week. 42.5 g 3    Miscellaneous Medication Apply 1 bottle to the skin one time. Name of Medication: chlorhexidine gluconate solution liquid soap wash 2 evenings before surgery and morning of surgery 1 each 0    PEG 3350-Electrolytes-Vit C (MOVIPREP) 100-7.5-2.691 gram Powder in Packet Take as directed on gi lab instruction sheet (may substitute with gavilyte-C) 1 packet 0    Tramadol (ULTRAM) 50 mg Tablet take 1 tablet by mouth at bedtime if needed for pain 30 tablet 0     No current facility-administered medications for this visit.      Family History   Problem Relation Name Age of Onset    Hypertension Mother      Heart Disease Mother       Blood Disease Father      Breast Cancer Sister          dx age 53     Past Surgical History:   Procedure Laterality Date    INJECTION, STEROID      Right thumb    PR COLONOSCOPY FLX DX W/COLLJ SPEC WHEN PFRMD  2006    no polyps per pt    PR HAND/FINGER SURG PROC UNLISTED Right     thumb    PR VAGINAL HYSTERECTOMY,UTERUS 250 GMS/<      one ovary removed, couldn't find other    TONSILLECTOMY         SUBJECTIVE:  Erica Hancock is a 61yr female patient of dR Krouse who presents with a chief complaint of 5 weeks of rash on her face  She was seen by rnp on Friday for a rash and she checked he microsope and she rx doxycycline. And she is on the last one today .  She has not seen much of a change from the meds.  She is still with the rash.  It is not bad but sometimes with  itching.  It started on  the left nares and moved to the middle of the nose and she had left over mupiracin ointment and it did not seem to help.  She tried miconazole and it may have helped some.  The infections seems to act like it is going to go away and thn it gets tiny little bumps and then it goes away and then It seems to come back. It is still on the corner of her mouth.  He nose is better but the corner of her mouth  is still there. Her younger son has hx of mrsa and so he has her all freaked out about it.     She  also wants me to know she has toenail with fungus on it. We discussed using lamisil scoured into the nail twice daily and to consider oral treatment down the road after her back surgery.    ROS- no chest pain, no shortness of breath, no fever, no urinary or bowel problems. Other than those listed above.      OBJECTIVE:  Temp: 36.7 C (98.1 F) (11/07 1339)  Temp src: Temporal (11/07 1339)  Pulse: 79 (11/07 1339)  BP: 128/78 (11/07 1339)  Resp: --  SpO2: --  Height: --  Weight: 71.9 kg (158 lb 8.2 oz) (11/07 1339)      GEN-alert, oriented, well nourised and comfortable with normal affect  SKIN-with few small spots on the  right side of the mouth very small 2 1 mm spots nothing near the nose  EYES-PERRLA, EOMI, clear sclera   EARS-normal canals, clear TM's no discharge  HEART- normal s1 s2, normal rate, no murmurs or extra sounds  LUNGS-good air movement, no rales, no wheezing, no rhonchi    ASSESSMENT/PLAN:      ICD-10-CM    1. Folliculitis E07.1     resolving with the doxy continue currrent corse and rtc wihtout further intervention        I did review patient's past medical and family/social history and it has been updated of any changes.  No barriers to learning. Patient verbalizes understanding of teaching and instructions. Patient reminded to always contact me should any concerns develop.  This report electronically signed by   Novella Rob, M.D.  Board Certified in [place].

## 2017-01-05 ENCOUNTER — Encounter: Payer: Self-pay | Admitting: Specialist

## 2017-01-05 NOTE — Progress Notes (Unsigned)
Transfusion Service Physician Evaluation     Date of Service:01/04/17  BBSPECIMEN#:[id number]    This 61 year old A Rh negative female is scheduled for spine surgery. 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 All Rh negative type A or O will be compatible.    I have personally reviewed the laboratory test results and this is my assessment of them.    Davene Costain, MD - Attending  Medical Director Transfusion Services  PI# [phone]  Pager: [phone]    (For Dept of Pathology Use)  LIS BB Test Code: BBMDS

## 2017-01-06 LAB — CULTURE URINE, BACTI: URINE CULTURE: NO GROWTH

## 2017-01-06 LAB — PATHOLOGIST REVIEW, BBK XM/AB
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
## 2017-01-11 ENCOUNTER — Ambulatory Visit: Attending: Otolaryngology | Admitting: Otolaryngology

## 2017-01-11 VITALS — BP 126/78 | HR 91 | Temp 99.1°F | Resp 16 | Ht 64.0 in | Wt 158.1 lb

## 2017-01-11 DIAGNOSIS — H9313 Tinnitus, bilateral: Principal | ICD-10-CM | POA: Insufficient documentation

## 2017-01-11 DIAGNOSIS — H9190 Unspecified hearing loss, unspecified ear: Secondary | ICD-10-CM | POA: Insufficient documentation

## 2017-01-11 NOTE — Progress Notes (Signed)
Patient identification verified Erica Hancock May 02, 1955) x2. Allergies checked. Vitals obtained. Pain level and barriers assessed. Medication reconciled. Concepcion Living, LVN

## 2017-01-17 ENCOUNTER — Other Ambulatory Visit: Payer: Self-pay | Admitting: NURSE PRACTITIONER

## 2017-01-17 DIAGNOSIS — Z01818 Encounter for other preprocedural examination: Principal | ICD-10-CM

## 2017-01-21 ENCOUNTER — Encounter: Payer: Self-pay | Admitting: NURSE PRACTITIONER

## 2017-01-21 DIAGNOSIS — M4316 Spondylolisthesis, lumbar region: Principal | ICD-10-CM

## 2017-01-21 DIAGNOSIS — M48061 Spinal stenosis, lumbar region without neurogenic claudication: Secondary | ICD-10-CM

## 2017-01-21 DIAGNOSIS — M5416 Radiculopathy, lumbar region: Secondary | ICD-10-CM

## 2017-01-24 ENCOUNTER — Encounter: Payer: Self-pay | Admitting: NURSE PRACTITIONER

## 2017-01-24 ENCOUNTER — Ambulatory Visit: Attending: Family Medicine

## 2017-01-24 DIAGNOSIS — Z01818 Encounter for other preprocedural examination: Principal | ICD-10-CM | POA: Insufficient documentation

## 2017-01-24 LAB — TYPE AND SCREEN: PATIENT BLOOD TYPE: A NEG

## 2017-01-24 LAB — ANTIBODY SCREEN (SELECT CELL): ANTIBODY SCREEN: NEGATIVE

## 2017-01-24 NOTE — Pre-Op/Pre-Procedure Screening (Signed)
Pre-Op Phone Call Documentation    Verification of planned surgery times  Surgery Date/Time Verified: Yes(01/26/2017)  Arrival Time Verified: 1015  Surgery Location Verified: [place]    Verification of NPO Status  Verified NPO Clear Liquids Time: 0815  Verified NPO Clear Liquids Date: 01/26/17  Verified NPO Solids Time: 0000  Verified NPO Solids Date: 01/26/17     Post surgical discharge information   Person to pick up pt (if different than support person): Karen Kays - Husband  Phone number of person picking up patient: [phone]    Ruffin Frederick, RN  01/24/1810:28

## 2017-01-24 NOTE — Progress Notes (Signed)
[place], [place] [id number] L4-L5 fusion, possible interbody L3-L4 and L4-L5 laminectomies  preop Dr. Henrene Dodge [facility] [place] 12/28/16    Medical history:  Folliculitis- recent in November, treated  tinnitus, wears earplugs to decrease tinnitus, has been evaluated by ENT  depression  Red cell antibody, anti-D  chronic constipation  11/30/16 Dexascan: osteopenia  12 pack year history of smoking-not currently smoking, asthma, PFT 06/08/16: mild airflow limitation    surgical history:  tonsillectomy  vaginal hysterectomy  squamous cell CA, Moh's procedure    Labs:   01/04/17 UA with negative culture    MRI 09/11/16 archived, checked    Last clinic motor exam: 5/5 bilateral lower extremities    [place] NP-BC  Neurological Services  [place]: [place]     [id number]

## 2017-01-26 ENCOUNTER — Inpatient Hospital Stay: Admitting: Certified Registered"

## 2017-01-26 ENCOUNTER — Inpatient Hospital Stay

## 2017-01-26 ENCOUNTER — Encounter: Admission: RE | Payer: Self-pay | Source: Ambulatory Visit | Attending: Neurological Surgery

## 2017-01-26 ENCOUNTER — Inpatient Hospital Stay
Admission: RE | Admit: 2017-01-26 | Discharge: 2017-01-30 | DRG: 460 | Disposition: A | Discharge status: Home / Self Care | Source: Ambulatory Visit | Attending: Internal Medicine | Admitting: Internal Medicine

## 2017-01-26 ENCOUNTER — Inpatient Hospital Stay (HOSPITAL_COMMUNITY): Admitting: Certified Registered"

## 2017-01-26 DIAGNOSIS — M1990 Unspecified osteoarthritis, unspecified site: Secondary | ICD-10-CM | POA: Diagnosis present

## 2017-01-26 DIAGNOSIS — E785 Hyperlipidemia, unspecified: Secondary | ICD-10-CM | POA: Diagnosis present

## 2017-01-26 DIAGNOSIS — M48061 Spinal stenosis, lumbar region without neurogenic claudication: Secondary | ICD-10-CM

## 2017-01-26 DIAGNOSIS — M8580 Other specified disorders of bone density and structure, unspecified site: Secondary | ICD-10-CM | POA: Diagnosis present

## 2017-01-26 DIAGNOSIS — M5416 Radiculopathy, lumbar region: Secondary | ICD-10-CM

## 2017-01-26 DIAGNOSIS — K5900 Constipation, unspecified: Secondary | ICD-10-CM | POA: Diagnosis present

## 2017-01-26 DIAGNOSIS — F17200 Nicotine dependence, unspecified, uncomplicated: Secondary | ICD-10-CM | POA: Diagnosis present

## 2017-01-26 DIAGNOSIS — F329 Major depressive disorder, single episode, unspecified: Secondary | ICD-10-CM | POA: Diagnosis present

## 2017-01-26 DIAGNOSIS — J45909 Unspecified asthma, uncomplicated: Secondary | ICD-10-CM | POA: Diagnosis present

## 2017-01-26 DIAGNOSIS — M4316 Spondylolisthesis, lumbar region: Secondary | ICD-10-CM

## 2017-01-26 DIAGNOSIS — D62 Acute posthemorrhagic anemia: Secondary | ICD-10-CM | POA: Diagnosis not present

## 2017-01-26 DIAGNOSIS — G43909 Migraine, unspecified, not intractable, without status migrainosus: Secondary | ICD-10-CM | POA: Diagnosis present

## 2017-01-26 HISTORY — PX: LAMINECTOMY, LUMBAR, POSTERIOR APPROACH: SHX000973

## 2017-01-26 LAB — CBC NO DIFFERENTIAL
HEMATOCRIT: 36.6 % (ref 36.0–46.0)
HEMOGLOBIN: 12.7 g/dL (ref 12.0–16.0)
MCH: 33 pg (ref 27.0–33.0)
MCHC: 34.7 % (ref 32.0–36.0)
MCV: 95.2 fL (ref 80.0–100.0)
MPV: 7.4 fL (ref 6.8–10.0)
PLATELET COUNT: 281 10*3/uL (ref 130–400)
RDW: 12.5 % (ref 0.0–14.7)
RED CELL COUNT: 3.85 10*6/uL — AB (ref 4.00–5.20)
WHITE BLOOD CELL COUNT: 7.8 10*3/uL (ref 4.5–11.0)

## 2017-01-26 SURGERY — FUSION, SPINE, LUMBAR, POSTERIOR APPROACH, USING RODS AND SCREWS
Anesthesia: General | Site: Spine Lumbar | Wound class: Clean

## 2017-01-26 MED ORDER — ROCURONIUM 50 MG/5 ML (10 MG/ML) INTRAVENOUS SYRINGE
INJECTION | INTRAVENOUS | Status: DC | PRN
Start: 2017-01-26 — End: 2017-01-26
  Administered 2017-01-26 (×2): 50 mg via INTRAVENOUS

## 2017-01-26 MED ORDER — KETAMINE 50 MG/ML INJECTION SOLUTION
INTRAMUSCULAR | Status: AC
Start: 2017-01-26 — End: 2017-01-26
  Filled 2017-01-26: qty 10

## 2017-01-26 MED ORDER — VANCOMYCIN 1 GRAM/200 ML IN DEXTROSE 5 % INTRAVENOUS PIGGYBACK
1000.0000 mg | INJECTION | INTRAVENOUS | Status: AC
Start: 2017-01-26 — End: 2017-01-26
  Administered 2017-01-26: 1000 mg via INTRAVENOUS
  Filled 2017-01-26: qty 200

## 2017-01-26 MED ORDER — MIDAZOLAM (PF) 1 MG/ML INJECTION SOLUTION
INTRAMUSCULAR | Status: AC
Start: 2017-01-26 — End: 2017-01-26
  Filled 2017-01-26: qty 2

## 2017-01-26 MED ORDER — BACLOFEN 10 MG TABLET
5.0000 mg | ORAL_TABLET | Freq: Three times a day (TID) | ORAL | Status: DC
Start: 2017-01-26 — End: 2017-01-30
  Administered 2017-01-26 – 2017-01-30 (×11): 5 mg via ORAL
  Filled 2017-01-26 (×11): qty 1

## 2017-01-26 MED ORDER — PROPOFOL 10 MG/ML INTRAVENOUS EMULSION
INTRAVENOUS | Status: AC
Start: 2017-01-26 — End: 2017-01-26
  Filled 2017-01-26: qty 20

## 2017-01-26 MED ORDER — SUFENTANIL CITRATE 50 MCG/ML INTRAVENOUS SOLUTION
INTRAVENOUS | Status: AC
Start: 2017-01-26 — End: 2017-01-26
  Filled 2017-01-26: qty 5

## 2017-01-26 MED ORDER — HYDROMORPHONE 1 MG/ML INJECTION SYRINGE
0.2000 mg | INJECTION | INTRAMUSCULAR | Status: DC | PRN
Start: 2017-01-26 — End: 2017-01-26
  Administered 2017-01-26: 0.6 mg via INTRAVENOUS
  Administered 2017-01-26: 0.4 mg via INTRAVENOUS
  Administered 2017-01-26: 0.6 mg via INTRAVENOUS
  Filled 2017-01-26 (×2): qty 1

## 2017-01-26 MED ORDER — HYDROCODONE 5 MG-ACETAMINOPHEN 325 MG TABLET
2.0000 | ORAL_TABLET | ORAL | Status: DC | PRN
Start: 2017-01-26 — End: 2017-01-27
  Administered 2017-01-26 – 2017-01-27 (×2): 2 via ORAL
  Filled 2017-01-26 (×2): qty 2

## 2017-01-26 MED ORDER — INTRAOP PROPOFOL INJ 20 ML VIAL (BOLUS+INFUSION)
Status: DC | PRN
Start: 2017-01-26 — End: 2017-01-26
  Administered 2017-01-26: 150 mg via INTRAVENOUS
  Administered 2017-01-26: 50 mg via INTRAVENOUS

## 2017-01-26 MED ORDER — TRAMADOL 50 MG TABLET
50.0000 mg | ORAL_TABLET | Freq: Four times a day (QID) | ORAL | Status: DC | PRN
Start: 2017-01-26 — End: 2017-01-30
  Administered 2017-01-27 (×2): 50 mg via ORAL
  Filled 2017-01-26 (×2): qty 1

## 2017-01-26 MED ORDER — MORPHINE 2 MG/ML INJECTION SYRINGE
2.0000 mg | INJECTION | INTRAMUSCULAR | Status: DC | PRN
Start: 2017-01-26 — End: 2017-01-27

## 2017-01-26 MED ORDER — SUGAMMADEX 100 MG/ML INTRAVENOUS SOLUTION
INTRAVENOUS | Status: DC | PRN
Start: 2017-01-26 — End: 2017-01-26
  Administered 2017-01-26: 200 mg via INTRAVENOUS

## 2017-01-26 MED ORDER — SENNOSIDES 8.6 MG TABLET
2.0000 | ORAL_TABLET | Freq: Every day | ORAL | Status: DC
Start: 2017-01-26 — End: 2017-01-30
  Administered 2017-01-26 – 2017-01-29 (×4): 17.2 mg via ORAL
  Filled 2017-01-26 (×4): qty 2

## 2017-01-26 MED ORDER — LIDOCAINE-EPINEPHRINE (PF) 1 %-1:200,000 INJECTION SOLUTION
INTRAMUSCULAR | Status: DC | PRN
Start: 2017-01-26 — End: 2017-01-26
  Administered 2017-01-26: 10 mL

## 2017-01-26 MED ORDER — AMITRIPTYLINE 25 MG TABLET
50.0000 mg | ORAL_TABLET | Freq: Every day | ORAL | Status: DC
Start: 2017-01-26 — End: 2017-01-30
  Administered 2017-01-26 – 2017-01-29 (×4): 50 mg via ORAL
  Filled 2017-01-26 (×4): qty 2

## 2017-01-26 MED ORDER — INTRAOP DEXAMETHASONE 4 MG/ML INJ 1 ML VIAL
Status: DC | PRN
Start: 2017-01-26 — End: 2017-01-26
  Administered 2017-01-26: 10 mg via INTRAVENOUS

## 2017-01-26 MED ORDER — INTRAOP BUPIVACAINE 0.25%-EPI 1:200,000 PF INJ 30 ML VIAL
Status: DC | PRN
Start: 2017-01-26 — End: 2017-01-26
  Administered 2017-01-26: 30 mL

## 2017-01-26 MED ORDER — DOCUSATE SODIUM 100 MG CAPSULE
200.0000 mg | ORAL_CAPSULE | Freq: Two times a day (BID) | ORAL | Status: DC
Start: 2017-01-26 — End: 2017-01-30
  Administered 2017-01-26 – 2017-01-30 (×8): 200 mg via ORAL
  Filled 2017-01-26 (×8): qty 2

## 2017-01-26 MED ORDER — INTRAOP PROPOFOL INJ 100 ML VIAL (BOLUS+INFUSION)
Status: DC | PRN
Start: 2017-01-26 — End: 2017-01-26
  Administered 2017-01-26: 100 ug/kg/min via INTRAVENOUS

## 2017-01-26 MED ORDER — POTASSIUM CHLORIDE 20 MEQ/L IN 0.9 % SODIUM CHLORIDE INTRAVENOUS
INTRAVENOUS | Status: DC
Start: 2017-01-26 — End: 2017-01-27
  Administered 2017-01-26 – 2017-01-27 (×2): via INTRAVENOUS

## 2017-01-26 MED ORDER — FENTANYL (PF) 50 MCG/ML INJECTION SOLUTION
25.0000 ug | INTRAMUSCULAR | Status: DC | PRN
Start: 2017-01-26 — End: 2017-01-26
  Administered 2017-01-26 (×4): 25 ug via INTRAVENOUS
  Filled 2017-01-26: qty 2

## 2017-01-26 MED ORDER — DEPRECATED NACL 0.9% 100ML
Status: DC | PRN
Start: 2017-01-26 — End: 2017-01-26
  Administered 2017-01-26: 3 ug/kg/min via INTRAVENOUS
  Administered 2017-01-26: 3 ug via INTRAVENOUS

## 2017-01-26 MED ORDER — INTRAOP FENTANYL 50 MCG/ML INJ 5 ML AMP
Status: DC | PRN
Start: 2017-01-26 — End: 2017-01-26
  Administered 2017-01-26 (×5): 50 ug via INTRAVENOUS

## 2017-01-26 MED ORDER — PROPOFOL 10 MG/ML INTRAVENOUS EMULSION
INTRAVENOUS | Status: AC
Start: 2017-01-26 — End: 2017-01-26
  Filled 2017-01-26: qty 200

## 2017-01-26 MED ORDER — LIDOCAINE HCL 20 MG/ML (2 %) INJECTION SOLUTION
INTRAMUSCULAR | Status: DC | PRN
Start: 2017-01-26 — End: 2017-01-26
  Administered 2017-01-26: 5 mL via INTRAVENOUS

## 2017-01-26 MED ORDER — CEFAZOLIN IV PUSH ONCALL TO OR
2.0000 g | INTRAMUSCULAR | Status: AC
Start: 2017-01-26 — End: 2017-01-26
  Administered 2017-01-26: 2 g via INTRAVENOUS

## 2017-01-26 MED ORDER — LACTATED RINGERS IV INFUSION
INTRAVENOUS | Status: DC
Start: 2017-01-26 — End: 2017-01-26

## 2017-01-26 MED ORDER — DIAZEPAM 5 MG TABLET
5.0000 mg | ORAL_TABLET | Freq: Three times a day (TID) | ORAL | Status: DC | PRN
Start: 2017-01-26 — End: 2017-01-30
  Administered 2017-01-26 – 2017-01-30 (×7): 5 mg via ORAL
  Filled 2017-01-26 (×8): qty 1

## 2017-01-26 MED ORDER — PHENYLEPHRINE 1 MG/10 ML (100 MCG/ML) IN 0.9 % SOD.CHLORIDE IV SYRINGE
INJECTION | INTRAVENOUS | Status: DC | PRN
Start: 2017-01-26 — End: 2017-01-26
  Administered 2017-01-26 (×7): 80 ug via INTRAVENOUS

## 2017-01-26 MED ORDER — DEXMEDETOMIDINE 100 MCG/ML INTRAVENOUS SOLUTION
INTRAVENOUS | Status: DC | PRN
Start: 2017-01-26 — End: 2017-01-26
  Administered 2017-01-26: 12 ug via INTRAVENOUS

## 2017-01-26 MED ORDER — DEPRECATED NACL 0.9% 50ML
Status: DC | PRN
Start: 2017-01-26 — End: 2017-01-26
  Administered 2017-01-26: .007 ug via INTRAVENOUS

## 2017-01-26 MED ORDER — LACTATED RINGERS IV INFUSION
INTRAVENOUS | Status: DC
Start: 2017-01-26 — End: 2017-01-26
  Administered 2017-01-26 (×2): via INTRAVENOUS

## 2017-01-26 MED ORDER — LIDOCAINE HCL 10 MG/ML (1 %) INJECTION SOLUTION
0.1000 mL | INTRAMUSCULAR | Status: DC | PRN
Start: 2017-01-26 — End: 2017-01-26
  Administered 2017-01-26: 0.1 mL

## 2017-01-26 MED ORDER — CYCLOBENZAPRINE 10 MG TABLET
10.0000 mg | ORAL_TABLET | Freq: Three times a day (TID) | ORAL | Status: DC
Start: 2017-01-26 — End: 2017-01-26

## 2017-01-26 MED ORDER — ACETAMINOPHEN 1,000 MG/100 ML (10 MG/ML) INTRAVENOUS SOLUTION
INTRAVENOUS | Status: DC | PRN
Start: 2017-01-26 — End: 2017-01-26
  Administered 2017-01-26: 1000 mg via INTRAVENOUS

## 2017-01-26 MED ORDER — ONDANSETRON HCL (PF) 4 MG/2 ML INJECTION SOLUTION
INTRAMUSCULAR | Status: DC | PRN
Start: 2017-01-26 — End: 2017-01-26
  Administered 2017-01-26: 4 mg via INTRAVENOUS

## 2017-01-26 MED ORDER — METOCLOPRAMIDE 5 MG/ML INJECTION SOLUTION
10.0000 mg | INTRAMUSCULAR | Status: DC | PRN
Start: 2017-01-26 — End: 2017-01-26

## 2017-01-26 MED ORDER — BECLOMETHASONE DIPROP 40 MCG/ACTUATION HFA BREATH ACTIVATED AEROSOL
1.0000 | INHALATION_SPRAY | Freq: Two times a day (BID) | RESPIRATORY_TRACT | Status: DC
Start: 2017-01-26 — End: 2017-01-30
  Administered 2017-01-27 – 2017-01-30 (×6): 1 via RESPIRATORY_TRACT
  Filled 2017-01-26: qty 10.6

## 2017-01-26 MED ORDER — MIDAZOLAM (PF) 1 MG/ML INJECTION SOLUTION
INTRAMUSCULAR | Status: DC | PRN
Start: 2017-01-26 — End: 2017-01-26
  Administered 2017-01-26: 2 mg via INTRAVENOUS

## 2017-01-26 MED ORDER — ONDANSETRON HCL (PF) 4 MG/2 ML INJECTION SOLUTION
4.0000 mg | INTRAMUSCULAR | Status: DC | PRN
Start: 2017-01-26 — End: 2017-01-26

## 2017-01-26 MED ORDER — ONDANSETRON HCL (PF) 4 MG/2 ML INJECTION SOLUTION
4.0000 mg | Freq: Three times a day (TID) | INTRAMUSCULAR | Status: DC | PRN
Start: 2017-01-26 — End: 2017-01-30
  Administered 2017-01-30: 4 mg via INTRAVENOUS
  Filled 2017-01-26: qty 2

## 2017-01-26 MED ORDER — HEPARIN (PORCINE) 5,000 UNIT/ML SOLUTION FOR INJECTION
5000.0000 [IU] | Freq: Three times a day (TID) | INTRAMUSCULAR | Status: DC
Start: 2017-01-27 — End: 2017-01-29
  Administered 2017-01-27 – 2017-01-28 (×5): 5000 [IU] via SUBCUTANEOUS
  Filled 2017-01-26 (×7): qty 1

## 2017-01-26 MED ORDER — FENTANYL (PF) 50 MCG/ML INJECTION SOLUTION
INTRAMUSCULAR | Status: AC
Start: 2017-01-26 — End: 2017-01-26
  Filled 2017-01-26: qty 5

## 2017-01-26 MED ORDER — INTRAOP NACL 0.9% 1000 ML IRRIGATION BAG
Status: DC | PRN
Start: 2017-01-26 — End: 2017-01-26
  Administered 2017-01-26: 14:00:00

## 2017-01-26 SURGICAL SUPPLY — 43 items
BAG DECANTER BAG O JET (Other) ×1
BAG DECANTER BAG O JET 9IN (Other) ×3 IMPLANT
BONE GRAFT INFUSE SM 7510200 (Misc-Ortho) ×1 IMPLANT
BONE GRAFT INFUSE SM 7510200E (Misc-Ortho) ×3 IMPLANT
CATHETER FOLEY 16F TEMPERATURE SENSOR WITH URIMETER KIT (Catheter) ×4 IMPLANT
CAUTERY BOVIE PAD ADULT (Other) ×8 IMPLANT
CONV DNO USE 142120 - PACK THOROCOLUMBAR SPINE (Pack) ×4 IMPLANT
COVER LIGHT HANDLE STERIS (Other) ×12 IMPLANT
DEVICE BONE MILL DISPOSABLE STRYKER (Other) ×4 IMPLANT
DISC USE 105655 - DRESSING MEPILEX BORDER 23 X 23 CM SACRUM HEART LARGE (Dressing) ×16 IMPLANT
DISC USE 105781 - DRESSING MEPILEX BORDER SACRUM 18 X 18CM WITHSAFETAC (Dressing) ×8 IMPLANT
DISC USE 152549 - PREP CHLORAPREP 26ML WITH TINT ~~LOC~~ (Prep) ×8 IMPLANT
DRAIN WOUND SUCTION KIT (Drain) ×4 IMPLANT
DRAPE ADHESIVE ORGANIZER STRIP TS (Suture) ×4 IMPLANT
DRAPE STERI 17 X 11 3M 1000 (Drape) ×16 IMPLANT
DRAPE STERI INSTUMENT POUCH 3M 1018 (Drape) ×8 IMPLANT
DRESSING DERMABOND ADVANCED TISSUE ADHESIVE (Dressing) ×4 IMPLANT
DRESSING MEPILEX BORDER LITE W/SAFETAC 10X10CM/7 X7 IN (295300) (Dressing) ×4 IMPLANT
DRESSING TELFA 3 X 8IN (Dressing) ×4 IMPLANT
DRILL MIDAS REX ATTACHMENT 14CM X 3.0MM MATCH HEAD CUTTER MATCHSTICK STANDARD (Bit) ×4 IMPLANT
DRUG SURGIFLO MATRIX WITH THROMBIN 8ML (Sealant) ×12 IMPLANT
DRUG SURGIFLO MATRIX WITH THROMBIN 8ML 2994 (Sealant) ×9 IMPLANT
ELECTRODE BLADE INSULATED 2.75IN (Blade) ×8 IMPLANT
GLOVES BIOGEL 7 TOP GLOVE LATEX (Glove) ×32 IMPLANT
GLOVES BIOGEL INDICATOR 7 1/2 GREEN UNDERGLOVE LATEX (Glove) ×32 IMPLANT
HEADREST PRONE HEAD POSITIONER PILLOW (Other) ×4 IMPLANT
MEDYSSEY SCREW POLYAXIAL 5.5 X 50MM SP05550 (Screw) ×8 IMPLANT
MEDYSSEY SET SCREW ZENIUS TI 9.5MM SA095 (Screw) ×16 IMPLANT
MEDYSSEY ZENIUS TI POLYAXIAL PEDICLE SCREW 6.5MM X 50MM SP06550 (Screw) ×8 IMPLANT
MEDYSSEY ZENIUS TI PRECONTOURED ROD 5.5 X 45MM BR55045 (Rod) ×8 IMPLANT
MEPILEX BORDER POSTOP FOAM DRESSING 4 X 10 295850 (Dressing) ×4 IMPLANT
NEEDLE SPINAL 18 GAUGE X 3 1/2IN (Needle) ×4 IMPLANT
PAD XODUS JACKSON KIT WITHOUT HEAD REST (Other) ×4 IMPLANT
SCD SLEEVE CALF REG 18IN ALP 1 (Other) ×4 IMPLANT
SPONGE COTTONOID 1/2 X 1/2IN (Sponge) ×4 IMPLANT
SPONGE COTTONOID 1/2 X 3IN (Sponge) ×4 IMPLANT
SPONGE GAUZE 4 X 4IN 12PLY STERILE 10 PACK (Dressing) ×4 IMPLANT
SUTURE ETHILON 2-0 FS 18IN BLACK (Suture) ×4 IMPLANT
SUTURE MONOCRYL 3-0 PS-1 27IN UNDYED (Suture) ×4 IMPLANT
SUTURE POLYSORB 2-0 V-20 18IN POP OFF 5 PACK UNDYED (Suture) ×16 IMPLANT
SUTURE VICRYL 0 CT-1 18IN CONTROL RELEASE 8 PACK UNDYED (Suture) ×8 IMPLANT
TUBING CELL SAVER (Tubing) ×8 IMPLANT
TUBING SET CELL SAVER BASIC SYSTEM (Tubing) ×8 IMPLANT

## 2017-01-26 NOTE — Progress Notes (Addendum)
DEPARTMENT OF NEUROLOGICAL SURGERY   SPINE POST-OP NOTE    01/26/2017, 17:25    Date of service: 01/26/2017    LOS:  LOS: 0 days    POD#: 0     S/P:  L3-5 laminectomies,  L4-5 PSF    SUMMARY:       SUBJECTIVE:  Complains of some lower back stiffness     OBJECTIVE:     Medications  Scheduled Medications  Amitriptyline (ELAVIL) Tablet 50 mg, ORAL, Daily Bedtime  Baclofen (LIORESAL) Tablet 5 mg, ORAL, TID  Beclomethasone (QVAR) 40 mcg/actuation Inhaler 1 puff, INHALATION, BID  Docusate (COLACE) Capsule 200 mg, ORAL, BID  [START ON 01/27/2017] Heparin 5000 units/mL Injection 5,000 Units, SUBCUTANEOUS, Q8H (43,15,40)  Sennosides (SENOKOT) Tablet 17.2 mg, ORAL, Daily Bedtime      Continuous Medications  Lactated Ringers, , IV, CONTINUOUS, Last Rate: Stopped (01/26/17 1725)  NaCl 0.9% 1,000 mL with Potassium Chloride 20 mEq, , IV, CONTINUOUS, Last Rate: 125 mL/hr at 01/26/17 1730      PRN Medications  Diazepam (VALIUM) Tablet 5 mg, ORAL, Q8H PRN  Fentanyl (SUBLIMAZE) Injection 25 mcg, IV, Q5MIN PRN  Hydrocodone 5 mg/Acetaminophen 325 mg (NORCO  5) Tablet 2 tablet, ORAL, Q4H PRN  Hydromorphone (DILAUDID) Injection 0.2-0.6 mg, IV, Q15MIN PRN  Metoclopramide (REGLAN) Injection 10 mg, IV, PRN X 1  Morphine Injection 2 mg, IV, Q4H PRN  Ondansetron (ZOFRAN) Injection 4 mg, IV, PRN X 1  Ondansetron (ZOFRAN) Injection 4 mg, IV, Q8H PRN  Tramadol (ULTRAM) Tablet 50 mg, ORAL, Q6H PRN         Current Vital Signs  VITALS:  Vitals (Current):  Temp: 36.6 C (97.9 F)  Pulse: 75  BP: 112/67  Resp: 16  SpO2: 99 %         No intake/output data recorded.    I/O Current Shift:  In: 2000 [Crystalloid:2000]  Out: 1525 [Other:100]    PHYSICAL EXAM:    Neuro Exam:   GCS: Eyes: 4 Verbal: 5 Motor: 6  Pupils: OD size: 3 mm, reaction: brisk             OS size: 3 mm, reaction: brisk  Cranial Nerves: EOMI,FS    Motor:    Deltoid Biceps Triceps Wrist Flexion Wrist Extension Hand Intrinsics   Right 5 5 5 5 5 5    Left 5 5 5 5 5 5       Hip Flexion Knee  Extension Knee Flexion Dorsiflexion Great Toe Extension Plantar Flexion   Right 5 5 5 5 5 5    Left 5 5 5 5 5 5      Sensory: Intact to LT     Wound: closed with monocryl and dermabond  Wound Drain(s): 1 HV    Labs:  Latest Chem--7 with Mg    No results found for this basename: NA,K,CL,CO2,BUN,CR,GLU,MG in the last 48 hours  Latest CBC  Recent labs for the past 48 hours     01/26/17 1721    WHITE BLOOD CELL COUNT 7.8    HEMOGLOBIN 12.7    HEMATOCRIT 36.6    PLATELET COUNT 281     Latest INR / APTT    No results found for this basename: INR,APTT in the last 48 hours  ABG Last 24 hours   No results found for this basename: ARTPO2:*,ARTO2SAT:*,ARTPCO2:*,ARTPH:*,ARTHCO3:*,ARTBE:* in the last 24 hours    Post-Op Imaging: Uprights pending    PLAN:  Neuro:   Continue serial neuro exams.   Pain/spasm control.  Monitor HV output.  LSO brace when OOB   Uprights when able  physical therapy/OT  CV: Stable.  Pulm: Stable. Extubated. I/S q 1hr while awake   GI:  Advance diet as tolerated. Routine bowel care.   FEN: NS+52meqK @100 ; HLIV for good PO  DVT:  ALPS; OOB as tol    Electronically signed by:  Yevonne Aline, MD  Neurosurgery PGY3  Service Pager: ICU [phone], [place] [phone]   Pager: #[phone]  PI #: [id number]    I agree with the assessment and plan as outlined in the resident's note.  Report electronically signed by Terrall Laity, MD. Attending

## 2017-01-26 NOTE — OR Nursing (Signed)
OR Arrive    Patient's level of consciousness: awake and comfortable  Summary report reviewed: yes  Patient transported to OR via: gurney  Transported by: anesthesia person  Head of bed: elevated  Oxygen delivered via: N/A  Monitored enroute: no  Assumed [id number]

## 2017-01-26 NOTE — Anesthesia Procedure Notes (Signed)
Airway  Date/Time: 01/26/2017 1:03 PM    Staff    Patient location:  [facility]          Indications and Patient Condition    Spontaneous Ventilation: absent  Sedation level: level 2-1: moderate/analgesia (conscious sedation)  Preoxygenated: yes  Patient position: sniffing  MILS not maintained throughout  Mask difficulty assessment: 2 - vent by mask + OA or adjuvant +/- NMBA    Final Airway Details    Final airway type: endotracheal airway        Successful intubation technique: video laryngoscopy  Facilitating devices/methods: intubating stylet    Blade size: #3  ETT size: 7.5 mm  Cormack-Lehane Classification: grade I - full view of glottis  Placement verified by: chest auscultation and capnometry   Secured at 21 cm.  Number of attempts at approach: 1    Additional Comments  Short thyromental distance, MP3. Elective glide. Performed by Lajuan Lines

## 2017-01-26 NOTE — Nurse Transfer Note (Signed)
PACU Transfer Note, [facility] Transport  Note started on 01/26/2017 , 21:01    Patient transferred to E5 unit  via gurney. Escorted by [facility] at 2100 hours. Assessment unchanged.  Belongings with with patient. Report in EMR and RN notified patient in route. Salome Holmes, RN

## 2017-01-26 NOTE — Nurse Assessment (Signed)
PACU ADMIT NURSING NOTE    Note Started: 01/26/2017, 17:23     Received patient from OR at 1700 hours via gurney.  Monitor and Alarms on.  Patient sleepy but arousable. Pt put on 10L 02 non rebreather.  o2 100%.  Lumbar dressing dry and intact.  Hemovac drain intact lumbar drain.  Foley intact.  A-line intact.  Salome Holmes, RN

## 2017-01-26 NOTE — Anesthesia Postprocedure Evaluation (Addendum)
Patient: Erica Hancock    FUSION, SPINE, LUMBAR, POSTERIOR APPROACH, USING RODS AND SCREWS  LAMINECTOMY, LUMBAR, POSTERIOR APPROACH    Anesthesia Type: general    Vital Signs (Last Recorded):  BP: 104/56 (01/26/17 2015)  Pulse: 79 (01/26/17 2015)  Resp: 9 (01/26/17 2015)  Temp Max: 36.7 C (98.1 F)  (Last 24 hours)  Temp: 36.7 C (98.1 F) (01/26/17 2015)  SpO2: 96 % (01/26/17 2015) on    Device (Oxygen Therapy): room air (01/26/17 1704)      Anesthesia Post Evaluation    Procedure: Procedure(s):FUSION, SPINE, LUMBAR, POSTERIOR APPROACH, USING RODS AND SCREWSLAMINECTOMY, LUMBAR, POSTERIOR APPROACH  [facility]  Anesthesia: General    Patient location during evaluation: PACU  Patient participation: complete - patient participated  Level of consciousness: awake and alert  Pain management: adequate  Airway patency: patent  Anesthetic complications: no  Cardiovascular status: stable  Respiratory status: nasal cannula  Hydration status: euvolemic  Nausea and Vomiting: absent     Loralee Pacas, MD    Dispo: Pt with good pain and nausea control. VSS. O2 sat >95% on RA. Pt resting comfortably in bed, responding appropriately. Meets discharge criteria. Stable for transfer to the floor.   100 mcg fentanyl, 1.6 mg dilaudid.     Marcelino Freestone MD, [place]-3, PGY-4  Dept of Anesthesiology and Pain Medicine  Pager: [phone]    01/27/2017 07:24   This patient was seen, evaluated, and care plan was developed with the resident.  I agree with the assessment and plan as outlined in the resident's note.   Electronically signed Louann Sjogren MD Attending PI# [phone] Pager [phone]

## 2017-01-26 NOTE — Brief Op Note (Addendum)
RESIDENT BRIEF OPERATIVE NOTE  Service Neurosurgery  01/26/2017, 16:45    Date of Service: 01/26/2017    Pre-Op Diagnosis: Spondylolisthesis, lumbar region [M43.16] at L4-5  Spinal stenosis of lumbar region without neurogenic claudication [M48.061] at L4-5 and L3-4    Post-Op Diagnosis:  Same    Procedure Performed/Description:    1. L4 laminectomy, bilateral L3-4 and L4-5 foraminotomies  2. L4 to L5 arthrodesis/instrumentation using pedicle screws x 4, rods x 2 with in-situ autograft and InFuse (small)    Name of Surgeon and Assistants:  Surgeon(s) and Role:     * Terrall Laity, MD - Primary     * Docia Furl, Emeline Gins., MD - Resident - Assisting   Robbi Garter D.O.    Type of Anesthesia:  General    DVT Prophylaxis:  SCDs    Findings:  Good decompression of lumbar thecal sac and exiting nerve roots.     EBL:  100 ml    Drains:  One medium hemovac in subfascial space    Wound Vac Placed/Replaced: No    Fluids:  See anesthesia    Complications:  None    Outcome: Good    Robbi Garter D.O.  Neurosurgery Spine Fellow  01/26/2017 16:46       Operating Room Procedure Presence: I was physically present for the entire procedure except wound closure.    The information contained on this form is true and accurate to the best of my knowledge.  Further, I understand that if I misrepresent, falsify or conceal information regarding my participation in the professional service described above, I may be subject to fine, imprisonment, or civil penalty under applicable federal laws.     Terrall Laity, MD Attending Physician

## 2017-01-26 NOTE — Anesthesia Procedure Notes (Signed)
Arterial Line:    Date/Time: 01/26/2017 1:15 PM              An arterial line was placed using surface landmarks, Patient location:  [facility] for the following indication(s): continuous blood pressure monitoring.    A 20 gauge (size), Arrow (type) catheter was placed, Seldinger technique used, into the Left radial artery, secured by Tegaderm.    Events:  first attempt on right radial artery, unable to thread catheter.    Additional notes:  Performed by Lajuan Lines

## 2017-01-26 NOTE — OR Nursing (Signed)
OR To Postop Destination    Patient's level of consciousness: drowsy but responsive    Patient transferred to: PACU  Transported via: gurney  Transported by: anesthesia person and circulator  Head of bed: elevated  Oxygen delivered via: face mask  Monitored enroute: no    Report given to: PACU RN

## 2017-01-26 NOTE — Nurse Assessment (Signed)
PREOP ADMIT NURSING NOTE    Note Started: 01/26/2017, 10:13     Received patient from as a direct admit at 1000 hours via ambulation.  Patient status  awake and alert.  Oriented to room and unit.  Admission assessment and care plan initiated.   PIN number cards provided to patient.   Clementeen Hoof, RN

## 2017-01-26 NOTE — Anesthesia Preprocedure Evaluation (Addendum)
Anesthesia Evaluation    History of Present Illness  Airway   Mallampati: III  TM distance: <3 FB     Neck ROM is full.     Dental    Teeth problems: braces, broken bracket.   Pulmonary   (+) asthma,     Patient's breath sounds clear to auscultation. Cardiovascular     Rhythm: regular  Rate: normal  Cardiovascular ROS comment: Self reports history of palpitations "hasn't happened in years"  Patient has good exercise tolerance.   Neuro/Psych     GI/Hepatic/Renal   (+) GERD (),        Endo/Other - negative for endocrine conditions with ROS    (-) diabetes mellitus               Non smoker                 Anesthesia Plan    ASA 2     general     intravenous induction   Anesthetic plan and risks discussed with patient and spouse.  Resident/Fellow/CRNA discussed the plan with the attending.  I personally performed a physical assessment on this patient.      01/26/2017 12:36   This patient was seen, evaluated, and care plan was developed with the resident on 01/26/2017 at 12:15.  I agree with the assessment and plan as outlined in the resident's note.   Electronically signed Louann Sjogren MD Attending PI# [phone] Pager [phone]

## 2017-01-27 LAB — CBC NO DIFFERENTIAL
HEMATOCRIT: 33.9 % — AB (ref 36.0–46.0)
HEMOGLOBIN: 11.6 g/dL — AB (ref 12.0–16.0)
MCH: 32.8 pg (ref 27.0–33.0)
MCHC: 34.3 % (ref 32.0–36.0)
MCV: 95.7 fL (ref 80.0–100.0)
MPV: 7.4 fL (ref 6.8–10.0)
PLATELET COUNT: 307 10*3/uL (ref 130–400)
RDW: 12.2 % (ref 0.0–14.7)
RED CELL COUNT: 3.54 10*6/uL — AB (ref 4.00–5.20)
White Blood Cell Count: 9.6 10*3/uL (ref 4.5–11.0)

## 2017-01-27 LAB — C DIFFICILE SURVEILLANCE TEST: TEST RESULT 2: NEGATIVE

## 2017-01-27 MED ORDER — OXYCODONE ER 10 MG TABLET,CRUSH RESISTANT,EXTENDED RELEASE 12 HR
10.0000 mg | EXTENDED_RELEASE_ORAL_TABLET | Freq: Two times a day (BID) | ORAL | Status: DC
Start: 2017-01-27 — End: 2017-01-28
  Administered 2017-01-27 – 2017-01-28 (×2): 10 mg via ORAL
  Filled 2017-01-27 (×2): qty 1

## 2017-01-27 MED ORDER — OXYCODONE 5 MG TABLET
5.0000 mg | ORAL_TABLET | ORAL | Status: DC | PRN
Start: 2017-01-27 — End: 2017-01-30
  Administered 2017-01-27 (×3): 10 mg via ORAL
  Administered 2017-01-28: 5 mg via ORAL
  Administered 2017-01-28: 10 mg via ORAL
  Administered 2017-01-29 – 2017-01-30 (×7): 5 mg via ORAL
  Filled 2017-01-27 (×4): qty 1
  Filled 2017-01-27 (×3): qty 2
  Filled 2017-01-27 (×2): qty 1
  Filled 2017-01-27: qty 2
  Filled 2017-01-27 (×2): qty 1

## 2017-01-27 MED ORDER — BISACODYL 10 MG RECTAL SUPPOSITORY
10.0000 mg | RECTAL | Status: DC | PRN
Start: 2017-01-27 — End: 2017-01-30
  Administered 2017-01-28: 10 mg via RECTAL
  Filled 2017-01-27: qty 1

## 2017-01-27 MED ORDER — MAGNESIUM HYDROXIDE 400 MG/5 ML ORAL SUSPENSION
30.0000 mL | Freq: Two times a day (BID) | ORAL | Status: DC | PRN
Start: 2017-01-27 — End: 2017-01-30
  Administered 2017-01-27: 30 mL via ORAL
  Filled 2017-01-27: qty 30

## 2017-01-27 MED ORDER — MORPHINE 2 MG/ML INJECTION SYRINGE
2.0000 mg | INJECTION | INTRAMUSCULAR | Status: DC | PRN
Start: 2017-01-27 — End: 2017-01-30

## 2017-01-27 NOTE — Plan of Care (Signed)
Problem: Patient Care Overview  Goal: Plan of Care Review  Outcome: Ongoing (interventions implemented as appropriate)  Evaluation Note For All Goals  Patient s/p L3-L5 posterior fusion , blood pressure improved, pain control slowly improving , up to bathroom with LSO brace and use of FWW with minimal assist . hemovac with 120 ml drained. VSS.   Goal: Individualization and Mutuality  Outcome: Ongoing (interventions implemented as appropriate)    Goal: Discharge Needs Assessment  Outcome: Ongoing (interventions implemented as appropriate)    Goal: Interprofessional Rounds/Family Conf  Outcome: Ongoing (interventions implemented as appropriate)      Problem: Coping, Compromised Individual (Adult,Obstetrics,Pediatric)  Goal: Identify Related Risk Factors and Signs and Symptoms  Related risk factors and signs and symptoms are identified upon initiation of Human Response Clinical Practice Guideline (CPG).  Outcome: Ongoing (interventions implemented as appropriate)    Goal: Effective Coping  Patient will demonstrate the desired outcomes by discharge/transition of care.  Outcome: Ongoing (interventions implemented as appropriate)      Problem: Pain, Chronic (Adult)  Goal: Identify Related Risk Factors and Signs and Symptoms  Related risk factors and signs and symptoms are identified upon initiation of Human Response Clinical Practice Guideline (CPG).  Outcome: Ongoing (interventions implemented as appropriate)    Goal: Acceptable Pain/Comfort Level and Functional Ability  Patient will demonstrate the desired outcomes by discharge/transition of care.  Outcome: Ongoing (interventions implemented as appropriate)

## 2017-01-27 NOTE — Allied Health Progress (Signed)
COPD Case Manager Evaluation    EMR screening tool has flagged this pt for benefit of COPD Case Management services. This pt is not a candidate for these services at this time. COPD Case Management services are currently reserved for patients admitted for an Acute COPD Exacerbation who have been diagnosed with Emphysema and/or Chronic Bronchitis.    Respiratory medications have been reconciled.    Thank you,  Tieasha Larsen, RRT, RT IV, AE-C  COPD/Asthma Case Manager  [phone]  PGR [phone]

## 2017-01-27 NOTE — Allied Health Consult (Signed)
[facility] -- ACUTE CARE SERVICE    PHYSICAL THERAPY EVALUATION- E5    Name: Erica Hancock   MRN: [id number]   Date of Service: 01/27/2017    Time In: 0908  Time Out: 0955  Total Evaluation time: 30   Treatment time in addition to evaluation: 17                                                                                                                                 INTAKE INFORMATION AND HISTORY:                       Therapy Consult(s) Ordered: Physical Therapy and Occupational Therapy  Primary Service: (A) Neurosurgery   Date of Admission: 01/26/2017  Date of Onset:  01/26/17  Diagnosis: Spondylolisthesis and spinal stenosis of lumbar region      Prior treatment has not been provided for this diagnosis.  [place] PI#: [id number]  Language: English    Precautions: LSO Brace for out of bed; can be donned at EOB    History of Present Illness/Injury (Including pertinent test results & procedures):  Taken from chart:    61 year old woman with chief complaint of low back pain radiating down her legs bilaterally to the ankles that is also occasionally associated with numbness.  Her back pain is possibly 50% at rest leg pain.  Her back pain is exacerbated with activity and she is having difficulty sleeping as result of pain and inability to find a comfortable position.  She has tried nonsurgical treatment including medications and injections from pain clinic without significant relief.  Due to the fact that her Cardiolite is severely compromised she would like to pursue surgical intervention. Lumbar x-rays and MRI which showed L4-5 degenerative spondylolisthesis with abnormal motion and spinal stenosis at L4-5 and L3-4.    Underwent L4 laminectomy, bilateral L3-4 and L4-5 foraminotomies, and L4-L5 arthrodesis/instrumentation using pedicle screws x 4, rods x 2 with in-situ autograft on 01/26/17.    Medical History/Surgical History/Co-morbidities effecting the plan of care:   Past Medical  History:   Diagnosis Date    Asthma 02/11/2016    Basal cell carcinoma 04/14/2016    Basal cell carcinoma of skin of nose 04/14/2016    Depression     Elevated TSH 10/20/2016    Mixed hypercholesterolemia and hypertriglyceridemia     Osteopenia 04/14/2016    Recurrent major depressive disorder, in partial remission ([place]) 04/14/2016    Severe osteoarthritis of left AC (acromioclavicular) joint 09/14/2016    Tear of left supraspinatus tendon 09/14/2016     Past Surgical History:   Procedure Laterality Date    INJECTION, STEROID      Right thumb    PR COLONOSCOPY FLX DX W/COLLJ SPEC WHEN PFRMD  2006    no polyps per pt    PR HAND/FINGER SURG PROC UNLISTED Right  thumb    PR VAGINAL HYSTERECTOMY,UTERUS 250 GMS/<      one ovary removed, couldn't find other    TONSILLECTOMY         Social History/Personal and/or environmental factors effecting the plan of care:    Social History     Socioeconomic History    Marital status: MARRIED     Spouse name: Not on file    Number of children: Not on file    Years of education: Not on file    Highest education level: Not on file   Social Needs    Financial resource strain: Not on file    Food insecurity - worry: Not on file    Food insecurity - inability: Not on file    Transportation needs - medical: Not on file    Transportation needs - non-medical: Not on file   Occupational History    Occupation: self employed     Comment: Aeronautical engineer   Tobacco Use    Smoking status: Former Smoker     Packs/day: 1.00     Years: 12.00     Pack years: 12.00     Types: Cigarettes     Last attempt to quit: 03/01/1979     Years since quitting: 37.9    Smokeless tobacco: Never Used   Substance and Sexual Activity    Alcohol use: Yes     Alcohol/week: 3.6 oz     Types: 6 Glasses of wine per week     Comment: 1/day    Drug use: Yes     Frequency: 1.0 times per week     Types: Marijuana     Comment: once daily oil    Sexual activity: Not on file   Other Topics  Concern    Not on file   Social History Narrative    Not on file     SUBJECTIVE EXAM:  Current Living Situation: With family/friends  Support at Time of Discharge:  24/7 from spouse  Environment at Discharge:  Multi-level home  Environmental Barriers:  4 steps, landing, then 6 steps to access main level of home with R rail ascending; stall shower  Assistive Devices Owned: bedside commode  grab bars  Planning on purchasing a shower chair  Adaptive Equipment Owned: N/A  Prior Level of Function: Ambulating independently without assistive device  Mental Status: Alert and oriented x4; able to follow multi-step commands consistently    Pain Level / Chief Complaint: 6+/10 pain low back    OBJECTIVE EXAMINATION:  Observation: Older female supine in bed with LSO, L UE IV, Foley catheter   Extremity Status: WFL within spinal body mechanics       ROM MMT Sensation Tone Coordination   RUE WFL within spinal body mechanics Grossly at least 3/5 Light touch intact WFL WFL   LUE WFL within spinal body mechanics   Grossly at least 3/5 Light touch intact [facility] WFL   RLE WFL within spinal body mechanics   Grossly at least 3/5 hip and knee ext; 5/5 DF Light touch intact WFL WFL   LLE WFL within spinal body mechanics   Grossly at least 3/5 hip and knee ext; 5/5 DF Light touch intact [facility] WFL   Comments:      Demonstrated Functional Activities:   Key: Dep=Dependent     Max=Maximal     Mod=Moderate     Min=Minimal     CG=Contact Guard     SB=Stand-By     S=Supervised  I=Independent     NA=Not Applicable     NT=Not Tested     N=Normal     G=Good     F=Fair     P=Poor     U=Unable     FWW=Front-Wheeled Walker     AC=Axillary Crutches     SPC=Single-Point Cane     Level of Assistance Dep Max Mod Min CG SB S I NA NT Comments   Rolling        x     With head of bed <30 deg and L rail   Scooting        x     With head of bed <30 deg    Sidelying/ Supine to Sit    x       With head of bed <30 deg    Transfer Sit to Stand     x      With front  wheeled walker   Transfer Bed to Chair     x      With front wheeled walker   Gait       x      With front wheeled walker, about 25-30'   Up/Down Step/Stairs          x    Comments:       Balance:       N G F P U NA NT Comments   Sitting Balance - Static  x         Sitting Balance - Dynamic  x         Standing Balance - Static  x         Standing Balance - Dynamic   x        Comments:  Standing with front wheeled walker      ASSESSMENT / RECOMMENDATIONS/ EDUCATION:    Physical Therapy Assessment (including therapy diagnosis) and Recommendations:  On PT exam, patient presents with muscle weakness, limited range of motion, sitting and standing balance deficits, impaired functional mobility, abnormal gait, and decreased endurance compared to baseline. Overall minimal assist for bed mobility, transfer, and gait with front wheeled walker. Reported initial dizziness with sitting up at edge of bed which diminished with time (Sittig BP: 112/65 mmHg HR: 77 bpm, spO2 97% on RA). Pt has numerous steps to enter main level of home and will need stair training with CG assist for safe discharge home. Patient would benefit from skilled PT intervention for strengthening, balance, endurance training, DME/discharge planning, and functional mobility training with front wheeled walker to return to prior level of function.     Physical Therapy Recommendations for Nursing: Out of bed to [facility] or chair using 1 person assist and FWW.  Phase: 3    DME recs: front wheeled walker   Patient will benefit from the use of a FWW to complete all ADL's/MRADLS within the home to go to the bathroom, kitchen and bedroom within a timely manner as crutches or cane will not resolve mobility limitations.       Clinical Presentation: Evolving with changing characteristics     Patient / Caregiver Education Today: Yes, PT POC, functional mobility training    Method of Teaching: verbal     Learner: patient    Response: verbalizes understanding    GOALS /  TREATMENT PLAN / FUNCTIONAL PROGNOSIS:  Patient's Goals: To move with less pain; to go home  Physical Therapy Goals:    Bed Mobility: Supervised with head of  bed flat  Transfers: Supervised with front wheeled walker  Gait: Supervised with front wheeled walker at least 75'  Stair Gait: Up/down 10 steps with R rail, minimal assist  Home Exercise Program: Supervised walking program  Patient Education: Demonstrates good awareness of body mechanics with out of bed mob  Caregiver Education: Able to safely assist patient and provide appropriate verbal cues.     Prognosis:  Good     Treatment Plan:    Bed Field seismologist / Administrator, Civil Service Recommendations     Recommended Frequency of Treatment: Once a day     Recommended Duration of Treatment: DC or until goals are met; reassess in 2 weeks    This Service is Medically Necessary.    Patient / Caregiver Participation / Education   Has the plan of care been explained to the patient / caregiver? yes   Is the patient able to understand the plan of care?  yes    Does the patient / caregiver(s) agree with the plan of care? yes    List Barriers that may interfere with plan of care:   Pain    Interim Report Due: 02/10/2017    Plan of Care Re-Certification Due (90 Days from original Plan of Care): 04/26/2017       No additional treatment rendered this encounter.     x Patient seen for additional Physical therapy treatment; see below for additional treatment details       Pt seen for functional mobility and transfer training with front wheeled walker, focus on maintaining body mechanics. Would benefit from continued functional mobility training and stair training prior to discharge home.    Functional Limitation Reporting:   [phone] - The Patient's Primary  Functional Limitation is Mobility (Walking and moving around) at the time of this PT Evaluation.    Current Severity:  CJ- At least 20% but less than 40% impaired, limited or restricted    Functional Limitation Goal:   [id number] - The Patient's Primary Functional Limitation is Mobility (Walking and moving around) at the time of this PT Evaluation; this goal addresses Mobility.    Severity Goal:  CI- At least 1% but less than 20% impaired, limited or restricted    FOR PATIENTS DISCHARGED IN SAME VISIT:      Discharge Status:   --Not Applicable-the patient is not being discharged from PT at this time.        The components of this evaluation necessitated a Moderate complexity level of clinical decision making.      Report Electronically Signed By:   Iline Oven, PT, DPT  PI#: [id number]  Department of Physical Medicine & Rehabilitation  [place] [phone]  Pager/PT Scheduling [phone]

## 2017-01-27 NOTE — Allied Health Progress (Signed)
Orthotics/Prosthetics patient Progress Note    Progress Note: [name] was seen 01/27/2017 for an OTS Lumbar Sacral Orthosis (LSO) with rigid anterior, posterior, and lateral panels.  Patient has an active/verbal order Doctors referral/ verbal order to be evaluated and treated for this device.  It has been determined by the referring physician that this device is medically necessary, can functionally benefit the patient, and is required stabilization for medical reasons.          Subjective:     Erica Hancock is a 61yr old female. Her height is 5.4, and weight is 158 lbs.    Pain Status (out of 10): 6  * If pain is high, what was done: N/A    Location of pain: lower back    Patient's Complaints: none    History of the patient/ onset: none    Additional subjective information: none         Objective:    Patient response to visit: Receptive    Observations: none    Appearance: average height and well-developed    Measurements Taken: Yes, explain: waist-36         Assessment:    Dx:       Patient Active Problem List   Diagnosis    Migraines    Insomnia    Arthritis of carpometacarpal (CMC) joint of right thumb    Asthma    HLD (hyperlipidemia)    Spondylolisthesis of lumbar region    Foraminal stenosis of lumbar region    Lumbar radicular pain    Calcific tendinitis of left shoulder    Pain in joint of left shoulder    Basal cell carcinoma of skin of nose    Basal cell carcinoma    Osteopenia    Recurrent major depressive disorder, in partial remission ([place])    Mixed hypercholesterolemia and hypertriglyceridemia    Tear of left supraspinatus tendon    Severe osteoarthritis of left AC (acromioclavicular) joint    Elevated TSH    Tarlov cyst       Occurrence: Acute    Was patient pre-fit in spine center? no    (If pre-fit in spine center, see Orthotist encounter note for measurements)    Device Delivered? Yes,    lg sized [place] Quick Draw   reg sized Rigid Anterior Panel   12" sized Rigid  Posterior Panel      Base Codes: [phone]: Lumbar-sacral orthosis, sagittal-coronal control, with rigid anterior and posterior frame/panels, posterior extends from sacrococcygeal junction to T-9 vertebra, lateral strength provided by rigid lateral frame/panels, produces intracavitary pressure to reduce load on intervertebral discs, includes straps, closures, may include padding, shoulder straps, pendulous abdomen design, prefabricated, OTS measurements.    The device is used to:   Reduce pain by restricting mobility of the trunk; no   Facilitate healing following an injury to the spine or related soft tissues; yes   Facilitate healing following a surgical procedure on the spine or related soft tissue; yes   Otherwise support weak spinal muscles and/or a deformed spine; yes.   Immobilize the specified areas of the spine; yes.   Used as an intimate fit and generally designed to be worn under clothing; yes.   Used specifically designed for beneficiaries in wheelchairs no.    Additional Assessment information: none           Plan:    Service Provided: Fit patient with a off the shelf LSO    Wear  schedule: As prescribed    Additional plans: none    Education: patient was educated and demonstrated the ability to don and doff the device appropriately by: making sure the device is in the proper orientation before donning, by having the proper tightness on the straps, and making sure there is adequate overlap of the Velcro closure.     Handout: yes    Verbal Instruction: patient was given verbal instructions on: proper cleaning of the device by hand washing and air drying, proper donning and doffing, maintenance of the device by making sure the Velcro stays clean, the functionality of the device and its' intended use to stabilize the lumbar and sacral spine, the warranty of the device as 90 days, and where to follow up if any issues occur while using the device.      [facility] Tech IV  [place] #  [phone]  Vocera [phone]  PM&R Dept.

## 2017-01-27 NOTE — Allied Health Procedure (Signed)
Mobility Program Data Extraction Note  (See Physical Therapy Notes for Clinical Information)        Mobility Phase Guidelines: [email or web address]    Last Documented Mobility Phase: No Data Exists    Current Phase: Phase 3+    Mobility Session Initiated with Physical Therapy?: Yes    Mobility Session Completed with Physical Therapy?: Yes    Mobility Program Status: Initial Evaluation      ======================================================================       Phase I  Phase II    Command and physical response activation  Arousal/ orientation/ communication Degree of Interation: Low Cooperation    Command and verbal response activation     Patient and/or caregiver education    Arousal and orientation degree of Interaction: Comatose, Unarousable    Musculoskeletal Program: Advancement (AROM, AAROM, resistive training, metered exercise UE/LE    Musculoskeletal Program: Positioning Head to Feet: prevent subluxation, joint malalignment, manage tone, manage edema, visual-spatial orientation, PROM all limbs: proximal to distal, facilitate basic AROM  Participation in beginning components of bed mobility: Reaching, rolling, active LE      Sensorimotor Program: midline orientation, reflexes, tactile feedback  Positioning Head to Feet: prevent subluxation, joint malalignment, manage tone, manage edema, visual-spatial orientation    Caregiver education and participation as appropriate  Sensorimotor Program: visual attention, midline orientation, righting reactions    Dependent splint/orthotic application  Supported sitting EOB, cardiac chair, wheelchair    Dependent mobilization out of bed (to cardiac chair)  Mobilize out of bed: Passive Transfers Dependent through Max Assist  ?Lift Team indicated    Encourage patient participation with bed-level ADL's: Hygiene/grooming; self-feeding; upper body sponge bathing, upper body dressing  Mobilize  out of bed: Assess transfer type, level of assist, tolerance      Sitting schedule implemented all shifts      EOB: Supported, unsupported or challenged balance activities      Supported sitting exercise: metered exercise UE/LE      Pre-gait training (dependent to moderate assist)      Other mobilization: Tile Table Program      Passive Splint/Orthotic Application      Encourage patient participation with edge of bed ADL's: Hygiene/grooming; self-feeding; upper body sponge bathing/dressing; lower body sponge bathing    Phase III  Phase IV    Arousal/orientation/communication Degree of Interaction: Moderate Cooperation x Degree of interaction: Microbiologist for patient/family   x Patient and/or cargiver education as appropriate (incorporate any appropriate activity)  Advanced Musculoskeletal Program: Resistive, metered exercise UE/LE    Musculoskeletal Program: Advancement (AROM, AAROM, resistive training metered exercise UE/LE, Object Manipulation)  Advanced bed mobility/incorporate nursing all shifts    x Sensorimotor Program: proprioception feeback, coordination reactions  Sensorimotor Program: timing, skilled voluntary control of limbs, position ; direction change reactions    x Caregiver education and participation  Functional transfer training (commode or chair)/incorporate nursing all shifts   x Bed mobility advancement  High level balance activities   x Sitting balance advancement: Static and dynamic trunk activities  Gait program or (wheelchair mobility for wheelchair-dependent only), incorporate nursing all shifts   x Advance out of bed sitting tolerance  Active splint/orthotic application   x Active assisted transfer training and advancement x Encourage patient participation in ADL's in bathroom setting: Use of regular toilet; ADL's at sink-side; consider use of shower stall    Standing Activities (Pre-gait): Supported/unsupported, Static/dynamic; tilt table     x Gait Training/assisted gait      x Active assistive splint/orthotic application  x Encourage Patient participation in seated ADL Activities OOB in bedside chair/wheelchair/ cardiac chair/ bedside commode: Hygiene/grooming; self-feeding; upper body sponge bathing/ dressing; lower Body sponge bathing/ dressing; toileting       Phase D for Pediatric use    ======================================================================    Electronically signed by:     Tommi Crepeau, PT, DPT  PI#: [id number]  Department of Physical Medicine & Rehabilitation  Vocera [phone]  Pager/PT Scheduling [phone]

## 2017-01-27 NOTE — MD Query (Addendum)
MEDICAL RECORD INPATIENT CODING UNIT  CLINICAL DOCUMENTATION IMPROVEMENT REQUEST    Patient Name:   Erica Hancock  Acct:     MRN: [id number]  [place] Date: 01/26/2017   Physician: Maudie Mercury, MD         Additional documentation/clarification is needed to ensure that the severity of illness, risk of mortality, and DRG are accurate for your patient's encounter.    The following clinical information needs clarification.  Please see my question(s) below.  To respond, addend and sign this query.  Your addendum and signature will be filed as a permanent note in the patient's legal medical record.        The medical record reflects the following clinical findings, treatment, and risk factors.    Clinical Findings:   Operative note shows blood loss was 100 ml.   Flow sheet shows sero-sanguinous drain total 280 ml over last 24 hour  Lab shows      10/18/2016  01/04/2017  01/26/2017  01/27/2017    HEMOGLOBIN 14.2 14.6 12.7 11.6 (L)   HEMATOCRIT 41.4 43.0 36.6 33.9 (L)       Risk Factors: s/p spinal fusion    Treatment: Serial CBC monitoring      QUESTION: Based on the information described above, is there a corresponding/additional diagnosis such as:       1. Expected Acute asymptomatic mild blood loss anemia.     2. Other explanation or diagnosis supported by the clinical findings (please provide)    3. Unable to determine a diagnosis (no explanation for clinical findings)    Please provide your answer below by clicking the addend button    ANSWER: Expected Acute asymptomatic mild blood loss anemia        Thank you,    [facility]  Value Department  [phone]

## 2017-01-27 NOTE — Progress Notes (Addendum)
DEPARTMENT OF NEUROLOGICAL SURGERY  [facility] PROGRESS NOTE    Today's Date: 01/27/2017    POD:  1    S/P: L4-5 fusion, L3-5 lami    SOCIAL:  Married      COMORBIDITYPAST HX:  Major Depression- amitriptilyne  Former smoker  Migraines  HLD  Asthma  Arthritis  Constipation- chronic  Osteopenia  Severe OA of L AC joint  Reaction to Prozac      BRIEF HPI  Erica Hancock is a 61yr old female Planned evaluation down in [place] [facility] for lumbosacral radiculitis at recommendation of Dr. Bridgette Habermann.  Also,  considering another specialist in [place].  Reports intense back pain.  Taking Celebrex twice daily.  Interested in Tramadol 50 mg once daily.  Difficulty with standing for more than 1 hour.  Did not tolerate Gabapentin well.    24-HOUR INTERVAL EVENTS  Clinical  Pleasant, cooperative  Pain 8/10  Oxy long acting added  Pain pharm consult- no rec's as of 1530  Hep sq  LSO   PT/OT  BMP ordered for AM  FWW ordered    Diagnostics  WBC 9.6  H/H 11.6/33.9    Other  Needs X ray L spine    IV MEDICATIONS   NaCl 0.9% 1,000 mL with Potassium Chloride 20 mEq, , IV, CONTINUOUS, Last Rate: 125 mL/hr at 01/27/17 0346        SCHEDULED MEDICATIONS   Amitriptyline (ELAVIL) Tablet 50 mg, ORAL, Daily Bedtime  Baclofen (LIORESAL) Tablet 5 mg, ORAL, TID  Beclomethasone (QVAR) 40 mcg/actuation Inhaler 1 puff, INHALATION, BID  Docusate (COLACE) Capsule 200 mg, ORAL, BID  Heparin 5000 units/mL Injection 5,000 Units, SUBCUTANEOUS, Q8H (24,58,09)  OxyCODONE (OXYCONTIN) SR Tablet 10 mg, ORAL, Q12H  Sennosides (SENOKOT) Tablet 17.2 mg, ORAL, Daily Bedtime        PRN MEDICATIONS  Bisacodyl (DULCOLAX) Suppository 10 mg, RECTALLY, Q24H PRN  Diazepam (VALIUM) Tablet 5 mg, ORAL, Q8H PRN  Magnesium Hydroxide (MILK OF MAGNESIA) 400 mg/5 mL Suspension 30 mL, ORAL, Q12H PRN  Morphine Injection 2 mg, IV, Q4H PRN  Ondansetron (ZOFRAN) Injection 4 mg, IV, Q8H PRN  Oxycodone (ROXICODONE) Tablet 5-10 mg, ORAL, Q4H PRN  Tramadol (ULTRAM) Tablet 50  mg, ORAL, Q6H PRN        PHYSICAL EXAM  Temp src: Oral (11/30 0319)  Temp:  [36.2 C (97.2 F)-36.7 C (98.1 F)]   Pulse:  [69-91]   BP: (68-118)/(50-74)   Resp:  [9-23]   SpO2:  [90 %-100 %]     Temp: 36.2 C (97.2 F) (11/30 0319)  Temp src: Oral (11/30 0319)  Pulse: 71 (11/30 0612)  BP: 96/55 (11/30 0612)  Resp: 16 (11/30 0612)  SpO2: 96 % (11/30 0319)  Height: 162.6 cm (5\' 4" ) (11/29 2134)  Weight: 72.1 kg (158 lb 15.2 oz) (11/29 2134)    GENERAL: AAOX3  GCS: E4  M6  V5   PUPILS:  Right- 3 mm Brisk  Left- 3 mm Brisk  SPEECH: Clear  CN II-XII: Grossly intact   MOTOR: RUE 5/5; LUE 5/5; RLE 5/5; LLE 5/5    SENSORY: LT intact  SURGICAL WOUND:   Dressing D/I  SURGICAL SITE DRAINS:  No  Justification: N/A     I&O-24 HR  I/O Last 2 Completed Shifts:  In: 2865 [Oral:490; Crystalloid:2375]  Out: 9833 [Urine:2000; Other:280]    LABS   Recent labs for the past 48 hours     01/27/17 0605 01/26/17 1721  WHITE BLOOD CELL COUNT 9.6 7.8    HEMOGLOBIN 11.6* 12.7    HEMATOCRIT 33.9* 36.6    PLATELET COUNT 307 281        No results found for this basename: NA:*,GLU:*,K:*,BUN:*,CR:*,CL:*,CO2:*,CA:* in the last 48 hours     No results found for this basename: MG:* in the last 48 hours    No results found for this basename: PT:*,APTT:* in the last 48 hours     Microbiology Results:  NAI      ASSESSMENT/PLAN     NEURO: L4-5 fusion  Neuro checks every 4 hours  LSO when OOB  Pain control  Pain pharm consult- f/u  PT/OT  DME- FWW    PULM:   NAD    CV:   HDS    ID:   NAI  T max 36.5 C    GU:   Voiding  Indwelling Catheter: No    GI:   Last BM prior to admission  Diet: NURSE TO ADVANCE DIET AS TOLERATED  REGULAR DIET   GI Prophylaxis           ELECTROLYTES:  Metabolic Glucose:   Glucose   Date/Time Value Ref Range Status   01/04/2017 12:55 PM 98 70 - 99 mg/dL Final     POC: (Retired) POC Glucose, blood: --  Maintain Glucose < 180. Source of glucose is metabolic panel.   Glucose is at goal (no more than 2 values greater 180 over 24  hour period)       Maintain Sodium > 135.   Sodium   Date/Time Value Ref Range Status   01/04/2017 12:55 PM 138 135 - 145 mmol/L Final       VTE PROPHYLAXIS:  Hospital Medications             Heparin 5000 units/mL Injection 5,000 Units Starting on 01/27/2017. Inject 1 mL subcutaneously every 8 hours.                  Pharmacological DVT Prophylaxis Ordered: Yes  Mechanical: yes    IV THERAPY:  Central Line Present:  No  Peripheral IV Present: yes    REHAB:    Is patient ambulatory?Yes  PT Ordered:Yes  OT Ordered:Yes    SURGICAL SITE PLAN:   Observe daily for signs and symptoms of infection    DISCHARGE PLANNING:   Pending medical clearance    The information contained in this progress note was conveyed to the resident on-call.  Electronically Signed:  Kathryne Sharper, NP-C, MSN  Department of Neurological Surgery   PI [id number]  Tuesday-Friday [phone]  After 1600 call Department Pager [phone]      This patient was seen, evaluated, and care plan was developed.  I agree with the assessment and plan as outlined in the note.  Report electronically signed by Terrall Laity, MD. Attending

## 2017-01-27 NOTE — Nurse Assessment (Signed)
Received patient from PACU at 2115hrs, alert and awake, POD 0, s/p L3-5 laminectomies, L4-5 PSF, with intact hemovac and CDI dressing. Due meds given , tolerated regular diet. assited to position of comfort. Prn pain med given. Will monitor patient.    Oletta Darter RN

## 2017-01-27 NOTE — Nurse Assessment (Signed)
ASSESSMENT NOTE    Note Started: 01/27/2017, 09:49     Initial assessment completed and recorded in EMR.  Report received from night shift nurse and orders reviewed. Plan of Care reviewed and appropriate, discussed with patient.  Nita Sells, RN

## 2017-01-27 NOTE — Allied Health Consult (Signed)
PM& R -- ACUTE CARE SERVICE      OCCUPATIONAL THERAPY EVALUATION     Time In: 9:15  Time Out: 9:50  Total Evaluation time: 30 minutes  Treatment time in addition to evaluation: NA                                                                                                                                    INTAKE INFORMATION AND HISTORY:                                 Therapy Consult(s) Ordered: Physical Therapy and Occupational Therapy       Date of Admission:  01/26/2017       Date of Onset: 118/29/18       Diagnosis: POD#: 1    S/P:  L3-5 laminectomies,  L4-5 PSF  Prior treatment has not been provided for this diagnosis.       Language: English       Precautions:   Orders Placed This Encounter   Procedures    PRECAUTIONS     Standing Status:   Standing     Number of Occurrences:   1     Order Specific Question:   Type of precautions     Answer:   No risk precautions          History of Present Illness/Injury (Including pertinent test results & procedures): Copied from MD notes:    Erica Hancock is a 61 year old woman with chief complaint of low back pain radiating down her legs bilaterally to the ankles that is also occasionally associated with numbness.  Her back pain is possibly 50% at rest leg pain.  Her back pain is exacerbated with activity and she is having difficulty sleeping as result of pain and inability to find a comfortable position.  She has tried nonsurgical treatment including medications and injections from pain clinic without significant relief.  Due to the fact that her Cardiolite is severely compromised she would like to pursue surgical intervention.  I reviewed her lumbar x-rays and MRI which showed L4-5 degenerative spondylolisthesis with abnormal motion and spinal stenosis at L4-5 and L3-4.  I felt aggressive decompression associated with instrumented stabilization with maximized opportunity for improvement.  I talked to her regarding the surgery and the possibility that she may not  improve to a significant degree and also the risks associate with surgery.  She still wished to proceed and signed informed consent.    PHYSICAL EXAM:    Neuro Exam:   GCS: Eyes: 4 Verbal: 5 Motor: 6  Pupils: OD size: 3 mm, reaction: brisk             OS size: 3 mm, reaction: brisk  Cranial Nerves: EOMI,FS    Motor:    Deltoid Biceps Triceps Wrist Flexion Wrist Extension Hand Intrinsics   Right  5 5 5 5 5 5    Left 5 5 5 5 5 5       Hip Flexion Knee Extension Knee Flexion Dorsiflexion Great Toe Extension Plantar Flexion   Right 5 5 5 5 5 5    Left 5 5 5 5 5 5      Sensory: Intact to LT     Wound: closed with monocryl and dermabond  Wound Drain(s): 1 HV    Labs:  Latest Chem--7 with Mg    No results found for this basename: NA,K,CL,CO2,BUN,CR,GLU,MG in the last 48 hours  Latest CBC       Recent labs for the past 48 hours     01/26/17 1721    WHITE BLOOD CELL COUNT 7.8    HEMOGLOBIN 12.7    HEMATOCRIT 36.6    PLATELET COUNT 281     Latest INR / APTT    No results found for this basename: INR,APTT in the last 48 hours  ABG Last 24 hours   No results found for this basename: ARTPO2:*,ARTO2SAT:*,ARTPCO2:*,ARTPH:*,ARTHCO3:*,ARTBE:* in the last 24 hours    Post-Op Imaging: Uprights pending    PLAN:  Neuro:   Continue serial neuro exams.   Pain/spasm control.   Monitor HV output.  LSO brace when OOB   Uprights when able  physical therapy/OT  CV:     Stable.  Pulm:  Stable. Extubated. I/S q 1hr while awake   GI:      Advance diet as tolerated. Routine bowel care.   FEN:   NS+43meqK @100 ; HLIV for good PO  DVT:   ALPS; OOB as tol         Past Medical/Co-morbidities effecting the plan of care:   Past Medical History:   Diagnosis Date    Asthma 02/11/2016    Basal cell carcinoma 04/14/2016    Basal cell carcinoma of skin of nose 04/14/2016    Depression     Elevated TSH 10/20/2016    Mixed hypercholesterolemia and hypertriglyceridemia     Osteopenia 04/14/2016    Recurrent major depressive disorder, in partial  remission ([place]) 04/14/2016    Severe osteoarthritis of left AC (acromioclavicular) joint 09/14/2016    Tear of left supraspinatus tendon 09/14/2016          Surgical History:   Past Surgical History:   Procedure Laterality Date    INJECTION, STEROID      Right thumb    PR COLONOSCOPY FLX DX W/COLLJ SPEC WHEN PFRMD  2006    no polyps per pt    PR HAND/FINGER SURG PROC UNLISTED Right     thumb    PR VAGINAL HYSTERECTOMY,UTERUS 250 GMS/<      one ovary removed, couldn't find other    TONSILLECTOMY     '       Social History/Occupational Profile:   Social History     Socioeconomic History    Marital status: MARRIED     Spouse name: Not on file    Number of children: Not on file    Years of education: Not on file    Highest education level: Not on file   Social Needs    Financial resource strain: Not on file    Food insecurity - worry: Not on file    Food insecurity - inability: Not on file    Transportation needs - medical: Not on file    Transportation needs - non-medical: Not on file   Occupational History    Occupation: self employed  Comment: Aeronautical engineer   Tobacco Use    Smoking status: Former Smoker     Packs/day: 1.00     Years: 12.00     Pack years: 12.00     Types: Cigarettes     Last attempt to quit: 03/01/1979     Years since quitting: 37.9    Smokeless tobacco: Never Used   Substance and Sexual Activity    Alcohol use: Yes     Alcohol/week: 3.6 oz     Types: 6 Glasses of wine per week     Comment: 1/day    Drug use: Yes     Frequency: 1.0 times per week     Types: Marijuana     Comment: once daily oil    Sexual activity: Not on file   Other Topics Concern    Not on file   Social History Narrative    Not on file     SUBJECTIVE EXAM:       Current Living Situation: With family/friends       Support at Time of Discharge: husband       Environment at Discharge: Single level home       Environmental Barriers:  7 steps to enter home       Assistive Devices Owned: None        Adaptive Equipment Owned: N/A       Prior Level of Function: Ambulating independently without assistive device       Mental Status: Alert and oriented x4       Pain Level / Chief Complaint: 3  /10  In lower back     OBJECTIVE EXAMINATION:       Observation: Patient presents Supine in bed with LSO donned, ready to get out of bed. RN cleared pt for therapy.          Hand Dominance: Right    Extremity Status:  WFL     ROM MMT Sensation Tone Coordination   RUE   WFL Grossly 4/5 intact normal intact   LUE       WFL Grossly 4/5 intact normal intact   RLE          LLE              Activities of Daily Living:    See grid for details   Key: Dep=Dependent     Max=Maximal     Mod=Moderate     Min=Minimal     CG=Contact Guard     SB=Stand-By     S=Supervised     I=Independent     NA=Not Applicable     NT=Not Tested     N=Normal     G=Good     F=Fair     P=Poor     U=Unable     FWW=Front-Wheeled Walker     AC=Axillary Crutches     SPC=Single-Point Cane       Level of Assistance: Dep Max Mod Min CG SB S MODI NA NT Comments   Bed Mobility (roll/self-adjust)    x       With MIN verbal cues for hand placement on the bed rail    Sidelying To Sit / Supine To Sit    x          Transfer Bed To Chair     x      Using FWE   Transfer Bed To Toilet or BSC  x catheter   Grooming / Hygiene    x       For set up seated EOB due to increased pain and decreased endurance while sitting up.    Toileting - self cleaning          x    Upper Body Dress      x        Lower Body Dress    x          Self-feeding          x      Activities of Daily Living:  BARTHEL ADL INDEX   FEEDING: 10/10   0 = unable  5 = needs help cutting, spreading butter, etc., or  requires modified diet  10 = independent  BATHING: 0/5  0 = dependent  5 = independent (or in shower)  GROOMING: 0/5  0 = needs to help with personal care  5 = independent face/hair/teeth/shaving  (implements provided)  DRESSING: 5/10  0 = dependent  5 = needs help but can do about half  unaided  10 = independent (including buttons, zips, laces,  etc.)  BOWELS: 5/10  0 = incontinent (or needs to be given enemas)  5 = occasional accident  10 = continent  BLADDER: 5/10  0 = incontinent, or catheterized and unable to  manage alone  5 = occasional accident  10 = continent  TOILET USE: 5/10  0 = dependent  5 = needs some help, but can do something alone  10 = independent (on and off, dressing, wiping)  TRANSFERS (BED TO CHAIR AND BACK): 10/15  0 = unable, no sitting balance  5 = major help (one or two people, physical), can  sit  10 = minor help (verbal or physical)  15 = independent  MOBILITY (ON LEVEL SURFACES): 10/15  0 = immobile or < 50 yards  5 = wheelchair independent, including corners, >  50 yards  10 = walks with help of one person (verbal or  physical) > 50 yards  15 = independent (but may use any aid; for  example, stick) > 50 yards  STAIRS: 0/10  0 = unable  5 = needs help (verbal, physical, carrying aid)  10 = independent    SCORE: 50/100, 50% impairment.    Cognitive / Visual Perceptual:       Intact Impaired NT Comments   Orientation x      Memory x      Attention x      Following Directions x      Communication x      Problem Solving x      Judgment/ Safety x               NO VISUAL DEFICITS    ASSESSMENT / RECOMMENDATIONS / EDUCATION:           Occupational Therapy Assessment (including therapy diagnosis) and Recommendations:  The patient is a 61 year old woman who was admitted to [facility] and is now s/p:  L3-5 laminectomies,  L4-5 PSF. An OT evaluation was completed and pt presents with decreased physical endurance due to pain and decreased LE ROM when completing ADL"s impacting her independence in ADL's.  Patient presents with a decline in functional status compared to her prior level of functional mobility.  Despite deficits, anticipate that pt will be able to discharge home with her husband safely with distant supervision for ADL's and MIN A for managing stairs.  DME Recommendations:  TBD but anticipate a FWW>        Patient / Caregiver Education Today: Role of OT, OT poc              Method of Teaching: demonstration            Learner: patient and family/caregiver            Response: verbalizes understanding    GOALS / TREATMENT PLAN / Emogene Morgan PROGNOSIS:       Patient's Goals: return hoem       Occupational Therapy Goals:       Prior To Discharge Patient Will Demonstrate:  Bed Mobility:  Patient will be able to complete rolling in bed with MOD I  Without the use of bed rail  Hygiene/Grooming: Patient will be able to complete self grooming / hygiene standing at sink edge with MOD I.  Dressing: Upper Body:  Patient will be able to complete donning and doffing of shirt with MOD I while sitting in bedside chair or while sitting EOB  Dressing:  Lower Body: Patient will be able to complete donning and doffing of socks and or pants at EOB with MOD I assist and while seated EOB.   Functional transfer to toilet - with  assist and  CGA using FWW as needed  Patient able to complete self cleaning after toilet use - with CGA assist.               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      Patient / Caregiver Participation / Education   Has the plan of care been explained to the patient / caregiver? yes   Is the patient able to understand the plan of care?                         yes     Does the patient / caregiver(s) agree with the plan of care? yes   List barriers that may interfere with plan of care    None   Comments:               No additional treatment rendered this encounter.    Patient seen for additional Occupational therapy  treatment; see below for additional treatment details         Interim Report Due: 10/20/2016    Plan of Care Re-Certification Due (90 Days from original Plan of Care:  01/04/2017      Functional Limitation Reporting:   G8987 - The Patient's Primary Functional Limitation is Self Care at the time of this OT Evaluation.  Current Severity:    CK- At least 40% but less than 60% impaired, limited or restricted      Functional Limitation Goal:   D6644 - The Patient's Primary Functional Limitation is Self Care at the time of this OT Evaluation; this goal addresses Self Care  Severity Goal:   CI- At least 1% but less than 20% impaired, limited or restricted  FOR PATIENTS DISCHARGED IN SAME VISIT:      Discharge Status:   --Not Applicable-the patient is not being discharged from OT at this time.      The components of this evaluation necessitated a Moderate complexity level of clinical decision making.      Reported by:    Kathie Dike M.S.O.T. OTR/L; CBIS  Department of Physical Medicine and Rehabilitation   Occupational Therapist III   PI# [id number]  Contact Number: Vocera; Renae Mottley Pillon-Beebout

## 2017-01-27 NOTE — Op Note (Signed)
Operative note    Service: neurological surgery     Date of surgery: 01/26/2017    Preoperative diagnosis: L4-5 degenerative spondylolisthesis and spinal stenosis with radiculopathy    Postop diagnosis: same     Indication for surgery:  Erica Hancock is a 61 year old woman with chief complaint of low back pain radiating down her legs bilaterally to the ankles that is also occasionally associated with numbness.  Her back pain is possibly 50% at rest leg pain.  Her back pain is exacerbated with activity and she is having difficulty sleeping as result of pain and inability to find a comfortable position.  She has tried nonsurgical treatment including medications and injections from pain clinic without significant relief.  Due to the fact that her Cardiolite is severely compromised she would like to pursue surgical intervention.  I reviewed her lumbar x-rays and MRI which showed L4-5 degenerative spondylolisthesis with abnormal motion and spinal stenosis at L4-5 and L3-4.  I felt aggressive decompression associated with instrumented stabilization with maximized opportunity for improvement.  I talked to her regarding the surgery and the possibility that she may not improve to a significant degree and also the risks associate with surgery.  She still wished to proceed and signed informed consent.    Surgical procedure:  patient was brought to the operating room after general endotracheal anesthesia was administered she was carefully carefully positioned prone onto the [facility] spine table with open frame.  We used intraoperative fluoroscopy to mark out the side of the incision along the midline in the lumbar region.  Her back was then sterilely prepped and draped.  After surgical pause and preoperative antibiotic administration confirmed local anesthetic was subcutaneous infiltrated.  Skin incision was then made.  Dissection was carried to the lumbodorsal fascia and using Bovie cautery suppressed dissection was carried out to  expose the lamina from L3 down to L5.  We used intraoperative fluoroscopy to confirm that we were at the correct level.  We first proceeded with placement of implants.  Using anatomic landmarks and also intermittent use of fluoroscopy I would place pedicle screws bilaterally at L4 and L5.  At L4 we placed 5.5 x 50 and at L5 6.5 x 50 pedicle screws.  I intentionally chose a maximal width in length of the screws to get good purchase just the fact that intraoperatively we found her bone to be "soft".  We then placed a precontoured rod bilaterally and secured and after the final tightening I felt that we had good construct.  We then proceeded with the decompression.  Using rondure and a high-speed drill L4 laminectomy was performed.  We undercut to get to the L3-4 interspace and good thought decompression was performed.  We then went out laterally at L4-5 to perform a fast medial facetectomy and aggressive foraminotomies.  We went even down to L5-S1 area and thought everything was widely patent after we were finished with decompression.  Hemostasis was then obtained and using high-speed drill facet joints bilaterally at L4-5 and transverse process bilaterally were decorticated.  Due to the fact that she has at the least osteopenia and small Infuse was utilized bilaterally over the T-piece followed by autograft.  Medium Hemovac was then placed after hemostasis was achieved and the wound was then closed in multiple layers.    Complications none    Surgeons: Michaele Offer and Yevonne Aline    Please cc a copy to Otis Peak MD

## 2017-01-27 NOTE — Nurse Assessment (Signed)
ASSESSMENT NOTE    Note Started: 01/27/2017, 19:23     Initial assessment completed and recorded in EMR.  Report received from day shift nurse and orders reviewed. Plan of Care reviewed and discussed with patient and spouse. Patient ambulated with to the bathroom with walker. TLSO applied prior getting up pain medication reviewed with patient.       Oletta Darter RN

## 2017-01-27 NOTE — Plan of Care (Addendum)
Problem: Patient Care Overview  Goal: Plan of Care Review  Outcome: Ongoing (interventions implemented as appropriate)  Evaluation Note For All Goals    Patient received prn pain medication, pt still not controlled, encouraged self care and movement while in bed but pt was hurting.   Foley catheter in placed ,pt unable to tolerate sitting up,  day shift nurse made aware. For further pain management.              Problem: Coping, Compromised Individual (Adult,Obstetrics,Pediatric)  Goal: Identify Related Risk Factors and Signs and Symptoms  Related risk factors and signs and symptoms are identified upon initiation of Human Response Clinical Practice Guideline (CPG).  Outcome: Ongoing (interventions implemented as appropriate)    Goal: Effective Coping  Patient will demonstrate the desired outcomes by discharge/transition of care.  Outcome: Ongoing (interventions implemented as appropriate)      Problem: Pain, Chronic (Adult)  Goal: Identify Related Risk Factors and Signs and Symptoms  Related risk factors and signs and symptoms are identified upon initiation of Human Response Clinical Practice Guideline (CPG).  Outcome: Ongoing (interventions implemented as appropriate)    Goal: Acceptable Pain/Comfort Level and Functional Ability  Patient will demonstrate the desired outcomes by discharge/transition of care.  Outcome: Ongoing (interventions implemented as appropriate)

## 2017-01-28 LAB — CULTURE SURVEILLANCE, MRSA

## 2017-01-28 LAB — CBC NO DIFFERENTIAL
HEMATOCRIT: 34.6 % — AB (ref 36.0–46.0)
HEMOGLOBIN: 11.8 g/dL — AB (ref 12.0–16.0)
MCH: 32.8 pg (ref 27.0–33.0)
MCHC: 34.2 % (ref 32.0–36.0)
MCV: 95.9 fL (ref 80.0–100.0)
MPV: 7.6 fL (ref 6.8–10.0)
PLATELET COUNT: 266 10*3/uL (ref 130–400)
RDW: 12.3 % (ref 0.0–14.7)
RED CELL COUNT: 3.61 10*6/uL — AB (ref 4.00–5.20)
WHITE BLOOD CELL COUNT: 9.6 10*3/uL (ref 4.5–11.0)

## 2017-01-28 LAB — BASIC METABOLIC PANEL
CALCIUM: 8.2 mg/dL — AB (ref 8.6–10.5)
CARBON DIOXIDE TOTAL: 28 mmol/L (ref 24–32)
CHLORIDE: 101 mmol/L (ref 95–110)
CREATININE BLOOD: 0.95 mg/dL (ref 0.44–1.27)
GLUCOSE: 113 mg/dL — AB (ref 70–99)
POTASSIUM: 3.5 mmol/L (ref 3.3–5.0)
Sodium: 138 mmol/L (ref 135–145)
UREA NITROGEN, BLOOD (BUN): 11 mg/dL (ref 8–22)

## 2017-01-28 MED ORDER — OXYCODONE ER 10 MG TABLET,CRUSH RESISTANT,EXTENDED RELEASE 12 HR
10.0000 mg | EXTENDED_RELEASE_ORAL_TABLET | Freq: Every day | ORAL | Status: DC
Start: 2017-01-28 — End: 2017-01-30
  Administered 2017-01-28: 10 mg via ORAL
  Filled 2017-01-28 (×2): qty 1

## 2017-01-28 NOTE — Plan of Care (Signed)
Problem: Patient Care Overview  Goal: Plan of Care Review  Outcome: Ongoing (interventions implemented as appropriate)  Evaluation Note For All Goals    Patient received prn pain medications and for spasm, slept well but woke up with pain, prn med given, assisted pt on comfortable position. Was able to walk to the bathroom with walker x1 assist,  MOM given for constipation, refused for dulcolax, on scheduled stool softener, no noted BM within the shift.        Problem: Coping, Compromised Individual (Adult,Obstetrics,Pediatric)  Goal: Effective Coping  Patient will demonstrate the desired outcomes by discharge/transition of care.  Outcome: Ongoing (interventions implemented as appropriate)      Problem: Pain, Chronic (Adult)  Goal: Acceptable Pain/Comfort Level and Functional Ability  Patient will demonstrate the desired outcomes by discharge/transition of care.  Outcome: Ongoing (interventions implemented as appropriate)      Problem: Fall Risk (Adult)  Goal: Absence of Fall  Patient will demonstrate the desired outcomes by discharge/transition of care.  Outcome: Ongoing (interventions implemented as appropriate)

## 2017-01-28 NOTE — Nurse Assessment (Signed)
ASSESSMENT NOTE    Note Started: 01/28/2017, 23:53     Initial assessment completed and recorded in EMR.  Report received from day shift nurse and orders reviewed. Plan of Care reviewed and discussed with patient. POD 2 S/P L4-5 fusion, L3-5 lami; with CDI dressing, and hemovac. No Bm for 3days, encouraged patient to ambulate, dulcolax supp given together with stool softener.    Oletta Darter RN

## 2017-01-28 NOTE — Allied Health Progress (Signed)
Physical Therapy Progress Note    Date of Service: 01/28/2017   Time in: 1400  Total time: 25 Minutes    S: Pt cleared for PT by RN, received in the restroom, and agreeable to PT treatment. TLSO donned at PT arrival.    O:   Rolling: moderate assist; patient arching back and extending head while rolling . Pt encouraged to control breathing and   Bed Mobility: sit to R side lying to supine - moderate assist. Pt guarded with movement for bed mobility. Pt responds well to cuing for breathing, but maintains an arched back and extended head  Sit<>Stand: minimal assist with front wheeled walker; patient moving slowly  Bed<>Chair: minimal assist with front wheeled walker; patient moving slowly  Gait Training: 49ft with front wheeled walker, contact guard assist/SBA. Pt using reciprocal gait pattern, narrow BOS, patient drowsy and responding slowly, moving at a slow pace, and with mild to moderate deviation from midline.    Pt left supine in bed with all needs in reach and care transitioned to RN. TLSO doffed in supine, maximum assist.    A: Pt mobilizing well, but limited by pain, decreased alertness, and guarding.    P: Continue with current POC.    Leane Platt, PT, DPT  PM&R  PI # [phone]  Vocera [phone]

## 2017-01-28 NOTE — Allied Health Progress (Signed)
Occupational Therapy Progress Note    Date of Service: 01/28/2017   Time in: 10:30  Total time: 25 Minutes    S: 6/10 pain located at surgical site at the beginning of the treatment and was a 10/10 at the end of the session.     O: Treatment Included:     Bed Mobility:  Pt was sitting up in bedside chair eatting her breakfast. Husband at bedside, and RN cleared for treatment. Pt wanted to get up to use brush her teeth, and was willing to work with  Therapist.   Transfers: stand to sit using FWW with CGA  Sitting EOB to side lying to supine in bed with MAX A due to significant increase in pain and decreased ability to follow safety directions.  Dressing:  Donned socks at bedside chair with MIN A and steadying of her legs secondary to pain   Grooming/Hygiene: with MIN A for set up, seated at EOB due to severe pain and fatigue ( pt wanted to get to the restroom to complete at sink side but was unable to finish task).   Activity Tolerance: required frequent rest breaks due to fatigue and pain with poor follow through, ( pt increased throughout the session).   Pt/Family/Caregiver Training: Teach back techniques used for managing pt's pain, assisting with bed mobility and cuing for ADL's     A:  Pt demonstrated decrease in mobility and ability to complete ADL's secondary to pain. Pt willing to attempt ADL's but required MIN A with set up. Responded well with frequent breaks. Pt's husband/caregiver responded well to [facility] techniques needed for ADL's and cues for bed mobility     P continue with OT poc to increase independence with ADL's      Khalik Pewitt Pillon-Beebout M.S.O.T. OTR/L; CBIS  Department of Physical Medicine and Rehabilitation   Occupational Therapist III   PI# [phone]  Contact Number: Vocera; Annalisa Colonna Pillon-Beebout'

## 2017-01-28 NOTE — Plan of Care (Signed)
Problem: Patient Care Overview  Goal: Plan of Care Review  Outcome: Ongoing (interventions implemented as appropriate)  Evaluation Note For All Goals  Patient continue to mobilize , got more sleepy with current long acting pain medication, VSS, still with hemovac. Continue to work with PT.    Goal: Individualization and Mutuality  Outcome: Ongoing (interventions implemented as appropriate)    Goal: Discharge Needs Assessment  Outcome: Ongoing (interventions implemented as appropriate)    Goal: Interprofessional Rounds/Family Conf  Outcome: Ongoing (interventions implemented as appropriate)      Problem: Coping, Compromised Individual (Adult,Obstetrics,Pediatric)  Goal: Identify Related Risk Factors and Signs and Symptoms  Related risk factors and signs and symptoms are identified upon initiation of Human Response Clinical Practice Guideline (CPG).  Outcome: Ongoing (interventions implemented as appropriate)    Goal: Effective Coping  Patient will demonstrate the desired outcomes by discharge/transition of care.  Outcome: Ongoing (interventions implemented as appropriate)      Problem: Pain, Chronic (Adult)  Goal: Identify Related Risk Factors and Signs and Symptoms  Related risk factors and signs and symptoms are identified upon initiation of Human Response Clinical Practice Guideline (CPG).  Outcome: Ongoing (interventions implemented as appropriate)    Goal: Acceptable Pain/Comfort Level and Functional Ability  Patient will demonstrate the desired outcomes by discharge/transition of care.  Outcome: Ongoing (interventions implemented as appropriate)      Problem: Fall Risk (Adult)  Goal: Identify Related Risk Factors and Signs and Symptoms  Related risk factors and signs and symptoms are identified upon initiation of Human Response Clinical Practice Guideline (CPG).  Outcome: Ongoing (interventions implemented as appropriate)    Goal: Absence of Fall  Patient will demonstrate the desired outcomes by  discharge/transition of care.  Outcome: Ongoing (interventions implemented as appropriate)

## 2017-01-28 NOTE — Nurse Assessment (Signed)
0900 patient awake , assisted to bathroom c/o soreness in her back , sat up in chair and set up for breakfast , patient dozing off to sleep while in chair , very sleepy , assisted back to bed and settled comfortably. VSS (see EMR) .

## 2017-01-28 NOTE — Progress Notes (Addendum)
DEPARTMENT OF NEUROLOGICAL SURGERY  [facility] PROGRESS NOTE    Today's Date: 01/28/2017    POD:  2    S/P: L4-5 fusion, L3-5 lami    COMORBIDITYPAST HX:  Major Depression- amitriptilyne  Former smoker  Migraines  HLD  Asthma  Arthritis  Constipation- chronic  Osteopenia  Severe OA of L AC joint  Reaction to Prozac      BRIEF HPI  Erica Hancock is a 61yr old female Planned evaluation down in [facility] for lumbosacral radiculitis at recommendation of Dr. Bridgette Habermann.  Also,  considering another specialist in [place].  Reports intense back pain.  Taking Celebrex twice daily.  Interested in Tramadol 50 mg once daily.  Difficulty with standing for more than 1 hour.  Did not tolerate Gabapentin well.    24-HOUR INTERVAL EVENTS  Clinical  Sleepy this AM - wants to change Oxycontin to QHS only   Hemovac ( 268ml)   LSO when OOB   SQH   PT phase 3     Diagnostics  WBC 9.6  Hematocrit 34.6  Na 138     Other      IV MEDICATIONS        SCHEDULED MEDICATIONS   Amitriptyline (ELAVIL) Tablet 50 mg, ORAL, Daily Bedtime  Baclofen (LIORESAL) Tablet 5 mg, ORAL, TID  Beclomethasone (QVAR) 40 mcg/actuation Inhaler 1 puff, INHALATION, BID  Docusate (COLACE) Capsule 200 mg, ORAL, BID  Heparin 5000 units/mL Injection 5,000 Units, SUBCUTANEOUS, Q8H (31,51,76)  OxyCODONE (OXYCONTIN) SR Tablet 10 mg, ORAL, Q12H  Sennosides (SENOKOT) Tablet 17.2 mg, ORAL, Daily Bedtime        PRN MEDICATIONS  Bisacodyl (DULCOLAX) Suppository 10 mg, RECTALLY, Q24H PRN  Diazepam (VALIUM) Tablet 5 mg, ORAL, Q8H PRN  Magnesium Hydroxide (MILK OF MAGNESIA) 400 mg/5 mL Suspension 30 mL, ORAL, Q12H PRN  Morphine Injection 2 mg, IV, Q4H PRN  Ondansetron (ZOFRAN) Injection 4 mg, IV, Q8H PRN  Oxycodone (ROXICODONE) Tablet 5-10 mg, ORAL, Q4H PRN  Tramadol (ULTRAM) Tablet 50 mg, ORAL, Q6H PRN        PHYSICAL EXAM  Temp src: Oral (12/01 0900)  Temp:  [36.7 C (98.1 F)-37.4 C (99.3 F)]   Pulse:  [72-88]   BP: (101-123)/(59-75)   Resp:  [18]   SpO2:   [96 %-99 %]     Temp: 37.4 C (99.3 F) (12/01 0900)  Temp src: Oral (12/01 0900)  Pulse: 88 (12/01 0900)  BP: 123/75 (12/01 0900)  Resp: 18 (12/01 0900)  SpO2: 99 % (12/01 0900)  Height: 162.6 cm (5\' 4" ) (11/29 2134)  Weight: 72.1 kg (158 lb 15.2 oz) (11/29 2134)    GENERAL: AAOX3  GCS: E4  M6  V5   PUPILS:  Right- 3 mm Brisk  Left- 3 mm Brisk  SPEECH: Clear  CN II-XII: Grossly intact   MOTOR: RUE 5/5; LUE 5/5; RLE 5/5; LLE 5/5    SENSORY: LT intact  SURGICAL WOUND:   Dressing D/I  SURGICAL SITE DRAINS:  No  Justification: N/A     I&O-24 HR  I/O Last 2 Completed Shifts:  In: 2302.5 [Oral:990; Crystalloid:1312.5]  Out: 232 [Urine:2; Other:230]    LABS   Recent labs for the past 48 hours     01/28/17 0345 01/27/17 0605 01/26/17 1721    WHITE BLOOD CELL COUNT 9.6 9.6 7.8    HEMOGLOBIN 11.8* 11.6* 12.7    HEMATOCRIT 34.6* 33.9* 36.6    PLATELET COUNT 266 307 281  Recent labs for the past 48 hours     01/28/17 0345    SODIUM 138    GLUCOSE 113*    POTASSIUM 3.5    UREA NITROGEN, BLOOD (BUN) 11    CREATININE BLOOD 0.95    CHLORIDE 101    CARBON DIOXIDE TOTAL 28    CALCIUM 8.2*        No results found for this basename: MG:* in the last 48 hours    No results found for this basename: PT:*,APTT:* in the last 48 hours     Microbiology Results:  NAI      ASSESSMENT/PLAN     NEURO: L4-5 fusion  Neuro checks every 4 hours  LSO when OOB  Pain / spasm control  PT/OT  DME- FWW    PULM:   NAD    CV:   HDS    ID:   No issues     GU:   Voiding  Indwelling Catheter: No    GI:   Diet: NURSE TO ADVANCE DIET AS TOLERATED  REGULAR DIET   GI Prophylaxis           ELECTROLYTES:  Metabolic Glucose:   Glucose   Date/Time Value Ref Range Status   01/28/2017 03:45 AM 113 (H) 70 - 99 mg/dL Final     POC: (Retired) POC Glucose, blood: --  Maintain Glucose < 180. Source of glucose is metabolic panel.   Glucose is at goal (no more than 2 values greater 180 over 24 hour period)       Maintain Sodium > 135.   Sodium   Date/Time Value  Ref Range Status   01/28/2017 03:45 AM 138 135 - 145 mmol/L Final       VTE PROPHYLAXIS:  Hospital Medications             Heparin 5000 units/mL Injection 5,000 Units Starting on 01/27/2017. Inject 1 mL subcutaneously every 8 hours.                  Pharmacological DVT Prophylaxis Ordered: Yes  Mechanical: yes    IV THERAPY:  Central Line Present:  No  Peripheral IV Present: yes    REHAB:    Is patient ambulatory?Yes  PT Ordered:Yes  OT Ordered:Yes    SURGICAL SITE PLAN:   Observe daily for signs and symptoms of infection    DISCHARGE PLANNING:   When medically stable     Electronically Signed:  Toma Aran ACNP-C  Nurse Practitioner [facility]  Neurological Surgery Department  Personal Pager: [phone] (available Fri-Sun 0600 to 1800)  Neurosurgery Service Pager # [phone]      I agree with the assessment and plan as outlined in the note.  Report electronically signed by Terrall Laity, MD. Attending

## 2017-01-29 ENCOUNTER — Inpatient Hospital Stay (HOSPITAL_BASED_OUTPATIENT_CLINIC_OR_DEPARTMENT_OTHER)

## 2017-01-29 DIAGNOSIS — M4316 Spondylolisthesis, lumbar region: Secondary | ICD-10-CM

## 2017-01-29 DIAGNOSIS — M5416 Radiculopathy, lumbar region: Secondary | ICD-10-CM

## 2017-01-29 DIAGNOSIS — M48061 Spinal stenosis, lumbar region without neurogenic claudication: Secondary | ICD-10-CM

## 2017-01-29 LAB — BASIC METABOLIC PANEL
CALCIUM: 8.5 mg/dL — AB (ref 8.6–10.5)
CARBON DIOXIDE TOTAL: 26 mmol/L (ref 24–32)
CHLORIDE: 99 mmol/L (ref 95–110)
CREATININE BLOOD: 0.88 mg/dL (ref 0.44–1.27)
Glucose: 142 mg/dL — ABNORMAL HIGH (ref 70–99)
POTASSIUM: 3.6 mmol/L (ref 3.3–5.0)
SODIUM: 135 mmol/L (ref 135–145)
UREA NITROGEN, BLOOD (BUN): 8 mg/dL (ref 8–22)

## 2017-01-29 LAB — CBC NO DIFFERENTIAL
HEMATOCRIT: 35.5 % — AB (ref 36.0–46.0)
HEMOGLOBIN: 12 g/dL (ref 12.0–16.0)
MCH: 32.6 pg (ref 27.0–33.0)
MCHC: 33.9 % (ref 32.0–36.0)
MCV: 96.3 fL (ref 80.0–100.0)
MPV: 7.6 fL (ref 6.8–10.0)
PLATELET COUNT: 286 10*3/uL (ref 130–400)
RDW: 12.4 % (ref 0.0–14.7)
RED CELL COUNT: 3.69 10*6/uL — AB (ref 4.00–5.20)
WHITE BLOOD CELL COUNT: 8.9 10*3/uL (ref 4.5–11.0)

## 2017-01-29 MED ORDER — HEPARIN, PORCINE (PF) 5,000 UNIT/0.5 ML INJECTION SYRINGE
5000.0000 [IU] | INJECTION | Freq: Three times a day (TID) | INTRAMUSCULAR | Status: DC
Start: 2017-01-29 — End: 2017-01-30
  Administered 2017-01-30 (×2): 5000 [IU] via SUBCUTANEOUS
  Filled 2017-01-29 (×2): qty 1

## 2017-01-29 MED ORDER — REMOVE LIDOCAINE PATCH
2.0000 | Status: DC
Start: 2017-01-30 — End: 2017-01-30
  Administered 2017-01-30: 2 via TRANSDERMAL

## 2017-01-29 MED ORDER — LIDOCAINE 5 % TOPICAL PATCH
2.0000 | MEDICATED_PATCH | TOPICAL | Status: DC
Start: 2017-01-29 — End: 2017-01-30
  Administered 2017-01-30: 2 via TRANSDERMAL
  Filled 2017-01-29: qty 2

## 2017-01-29 NOTE — Nurse Focus (Signed)
FOCUS NOTE     Note began 01/29/2017  13:07    Patient doing well ambulating with husband. Still has some problems with bed mobility. Continue to encourage patient to do as much as she can on her own, and discourage her from asking staff and her husband to move her with the turn sheet.       Lars Mage RN

## 2017-01-29 NOTE — Nurse Assessment (Signed)
ASSESSMENT NOTE    Note Started: 01/29/2017, 23:21     Initial assessment completed and recorded in EMR.  Report received from day shift nurse and orders reviewed. Plan of Care reviewed and updated, discussed with patient.      a 61yr old female Planned evaluation down in [place] [facility] for lumbosacral radiculitisat recommendation of Dr. Bridgette Habermann. Also,considering another specialist in [place]. Reports intense back pain. Taking Celebrex twice daily. Interested in Tramadol 50 mg once daily. Difficulty with standing for more than 1 hour. Did not tolerate Gabapentin well. Now POD:  3 S/P: L4-5 fusion, L3-5 lami. Surgical back dressing dry and intact, with 1 hemovac to bulb suction draining to serosanguinous output.    Pt assessed - A and O x 4, speech clear and soft. PERRLA, grossly tracks, FS, TM. BUE + SS, + HG, + PP and able to lift off bed. BLE + TW, + DP and able to lift off bed. Pt denies any numbness or tingling. Tolerates diet. Tolerates OOb to BR with FWW w/ 1 person assist and able to void spont to yellow urine. Passing gas, pt had a small BM today per dayshift RN. Assisted in turns. Skin care.    Discussed w/ pt and husband re bowel care. Offered Milk of mag - pt and husband states " it did not work", offered other alternatives but pt's husband who is a retired Designer, multimedia " Just give her prune juice...she's usually very slow, she might go in the morning".     Pain meds and pain med regimen dicussed w/ pt.    LSO brace when OOB. Spinal prec maintained.    Pt assisted in needs. Maintained on safety and fall prec.    Will cont to monitor.    2140: Pt assisted to BR w/ FWW and LSO brace on and c/o pain - like shooting pain on her tail bone that radiates to her thighs and legs. NGY notified trhu txt page and requested for pain patch - with orders. Pt refused bedtime 10 mg oxycodone SR - states it knocked her out, so offered 5 mg oxy / roxi instead.     Will coordinate nsg care to  allow for adeq rest and sleep.    Will cont to monitor.    Becky Augusta, RN

## 2017-01-29 NOTE — Progress Notes (Addendum)
DEPARTMENT OF NEUROLOGICAL SURGERY  [facility] PROGRESS NOTE    Today's Date: 01/29/2017    POD:  3    S/P: L4-5 fusion, L3-5 lami    COMORBIDITYPAST HX:  Major Depression- amitriptilyne  Former smoker  Migraines  HLD  Asthma  Arthritis  Constipation- chronic  Osteopenia  Severe OA of L AC joint  Reaction to Prozac      BRIEF HPI  Erica Hancock is a 61yr old female Planned evaluation down in [facility] for lumbosacral radiculitis at recommendation of Dr. Bridgette Habermann.  Also,  considering another specialist in [place].  Reports intense back pain.  Taking Celebrex twice daily.  Interested in Tramadol 50 mg once daily.  Difficulty with standing for more than 1 hour.  Did not tolerate Gabapentin well.    24-HOUR INTERVAL EVENTS  Clinical  Awake / interactive this AM   Walk hall yesterday with FWW - fair amount of pain   Hemovac ( 38ml)   LSO when OOB   SQH   PT phase 3     Diagnostics  WBC 8.9  Hematocrit 35.5  Na 135   Needs Upright today      Other      IV MEDICATIONS        SCHEDULED MEDICATIONS   Amitriptyline (ELAVIL) Tablet 50 mg, ORAL, Daily Bedtime  Baclofen (LIORESAL) Tablet 5 mg, ORAL, TID  Beclomethasone (QVAR) 40 mcg/actuation Inhaler 1 puff, INHALATION, BID  Docusate (COLACE) Capsule 200 mg, ORAL, BID  Heparin 5000 units/mL Injection 5,000 Units, SUBCUTANEOUS, Q8H (57,32,20)  OxyCODONE (OXYCONTIN) SR Tablet 10 mg, ORAL, Daily Bedtime  Sennosides (SENOKOT) Tablet 17.2 mg, ORAL, Daily Bedtime        PRN MEDICATIONS  Bisacodyl (DULCOLAX) Suppository 10 mg, RECTALLY, Q24H PRN  Diazepam (VALIUM) Tablet 5 mg, ORAL, Q8H PRN  Magnesium Hydroxide (MILK OF MAGNESIA) 400 mg/5 mL Suspension 30 mL, ORAL, Q12H PRN  Morphine Injection 2 mg, IV, Q4H PRN  Ondansetron (ZOFRAN) Injection 4 mg, IV, Q8H PRN  Oxycodone (ROXICODONE) Tablet 5-10 mg, ORAL, Q4H PRN  Tramadol (ULTRAM) Tablet 50 mg, ORAL, Q6H PRN        PHYSICAL EXAM  Temp src: Axillary (12/02 0900)  Temp:  [36.5 C (97.7 F)-37.3 C (99.1 F)]    Pulse:  [86-116]   BP: (92-105)/(57-66)   Resp:  [18]   SpO2:  [93 %-99 %]     Temp: 36.9 C (98.4 F) (12/02 0900)  Temp src: Axillary (12/02 0900)  Pulse: 95 (12/02 0900)  BP: 105/60 (12/02 0900)  Resp: 18 (12/02 0900)  SpO2: 93 % (12/02 0900)  Height: 162.6 cm (5\' 4" ) (11/29 2134)  Weight: 72.1 kg (158 lb 15.2 oz) (11/29 2134)    GENERAL: AAOX3  GCS: E4  M6  V5   PUPILS:  Right- 3 mm Brisk  Left- 3 mm Brisk  SPEECH: Clear  CN II-XII: Grossly intact   MOTOR: RUE 5/5; LUE 5/5; RLE 5/5; LLE 5/5    SENSORY: LT intact  SURGICAL WOUND:   Dressing D/I  SURGICAL SITE DRAINS:  No  Justification: N/A     I&O-24 HR  I/O Last 2 Completed Shifts:  In: 240 [Oral:240]  Out: 90 [Other:90]    LABS   Recent labs for the past 48 hours     01/29/17 0348 01/28/17 0345    WHITE BLOOD CELL COUNT 8.9 9.6    HEMOGLOBIN 12.0 11.8*    HEMATOCRIT 35.5* 34.6*    PLATELET  COUNT 286 266      Recent labs for the past 48 hours     01/29/17 0348 01/28/17 0345    SODIUM 135 138    GLUCOSE 142* 113*    POTASSIUM 3.6 3.5    UREA NITROGEN, BLOOD (BUN) 8 11    CREATININE BLOOD 0.88 0.95    CHLORIDE 99 101    CARBON DIOXIDE TOTAL 26 28    CALCIUM 8.5* 8.2*        No results found for this basename: MG:* in the last 48 hours    No results found for this basename: PT:*,APTT:* in the last 48 hours     Microbiology Results:  NAI      ASSESSMENT/PLAN     NEURO: L4-5 fusion  Neuro checks every 4 hours  LSO when OOB  Pain / spasm control  PT/OT  DME- FWW    PULM:   NAD    CV:   HDS    ID:   No issues     GU:   Voiding  Indwelling Catheter: No    GI:   Diet: NURSE TO ADVANCE DIET AS TOLERATED  REGULAR DIET   GI Prophylaxis           ELECTROLYTES:  Metabolic Glucose:   Glucose   Date/Time Value Ref Range Status   01/29/2017 03:48 AM 142 (H) 70 - 99 mg/dL Final     POC: (Retired) POC Glucose, blood: --  Maintain Glucose < 180. Source of glucose is metabolic panel.   Glucose is at goal (no more than 2 values greater 180 over 24 hour period)        Maintain Sodium > 135.   Sodium   Date/Time Value Ref Range Status   01/29/2017 03:48 AM 135 135 - 145 mmol/L Final       VTE PROPHYLAXIS:  Hospital Medications             Heparin 5000 units/mL Injection 5,000 Units Starting on 01/27/2017. Inject 1 mL subcutaneously every 8 hours.                  Pharmacological DVT Prophylaxis Ordered: Yes  Mechanical: yes    IV THERAPY:  Central Line Present:  No  Peripheral IV Present: yes    REHAB:    Is patient ambulatory?Yes  PT Ordered:Yes  OT Ordered:Yes    SURGICAL SITE PLAN:   Observe daily for signs and symptoms of infection    DISCHARGE PLANNING:   When medically stable     Electronically Signed:  Toma Aran ACNP-C  Nurse Practitioner [facility]  Neurological Surgery Department  Personal Pager: [phone] (available Fri-Sun 0600 to 1800)  Neurosurgery Service Pager # [phone]      This patient was seen, evaluated, and care plan was developed.  I agree with the assessment and plan as outlined in the note.  Report electronically signed by Terrall Laity, MD. Attending

## 2017-01-29 NOTE — Discharge Instructions (Signed)
[facility]  POST-OP LUMBAR SPINE CARE      PATIENT EDUCATION      DISCHARGE  1.  Activity  Restrictions                 - You are strongly encouraged to walk on flat surfaces as much as you                    Feel comfortable. Listen to your body. If your tired - stop.                  - No driving while taking NARCOTIC medications                 - No lifting over 5 lbs ( a carton of milk)                  - Bending / Lifting / Twisting,  in general should be avoided until                   Fusion occurs. Use good body mechanics and minimize such activities.    2. Wound / Incision care   - Surgical dressing should remain on for 3-4 days              - Change dressing as needed.               - Check your incision daily for signs of infection / drainage / opening of incision              - Incision may be closed by sutures inside with material that dissolves, Overlying                 The incision are Steristrips, they provide extra support and will start to come off                 By themselves.               - If you are told you have Nylon suture closer - these will be removed on your first                Clinic visit               - You may Shower 72 hours after Surgery. It alright to get the incision wet. Pat                 Dry following the shower. Do not submerge incision underwater ( ie bathtub)     3. Lumbar Brace                -  Your Neurosurgeon,  may be required you wear a Lumbar brace                -  You should wear your brace when your on your feet / walking / riding in a car    4. Pain Management as ordered        5. Call Spine Clinic [phone]) if:   a. General: Fever, night sweats, nausea/vomiting  b. Wound: redness, swelling, or drainage  c. Neurologic: increasing pain; new weakness in arms or legs; new numbness or tingling; new loss of bowel or bladder control  d. Possible Blood Clot: Swelling, pain, warmth and/or discoloration in one or  both legs  6. Call 911: sudden cough, shortness of  breath, difficulty breathing, chest pain and/or severe lightheadedness  7. Follow-up in Spine clinic with your surgeon in 4-6 weeks: call [phone] to schedule.  8. WALKING IS STRONGLY ENCOURAGED!  9. AVOID SMOKING!    10. If you have sutures (stiches) or staples: schedule removal 10-14 days after surgery with your surgeon's nursing staff or your primary care provider.  11. DO NOT DRIVE WHILE TAKING NARCOTIC MEDICATION!      Constipation and Narcotic Pain medication  Opioid Induced Bowel Dysfunction (OBD)       Constipation is when you are not able to have a bowel movement (BM) for several days or have stools that are hard or difficult to pass without straining. Constipation is a common problem and the reason many people are admitted to the hospital. Even if you are not eating, you still need to have a bowel movement at least every other day.  Taking opioids (pain medicine) will make you constipated. This problem will not go away as long as you are taking opioids. Here are some things you can do to decrease constipation that is caused by taking opioids:   Take laxatives and/or stool softeners   Drink plenty of water   Stay physically active     What can I do to avoid constipation?   1.  Drink Water  Drink at least 2 quarts or 8 glasses (8-ounce size) of clear fluids each day. However, if you have been told to limit fluids, check with your doctor about how much you can drink.   2.  Physical Activity  Be sure to do some walking if you are able. Check with your doctor before starting any new exercises.   3.  Increase Fiber in your diet   Found mainly in fruits, vegetables, whole grains and legumes      4. Use Laxatives and Stool Softeners, as directed by your health care provider  You may need to take medicine every day, while you are on opioids, to control your constipation. See the following guide for how to use stool softeners and laxatives. Check with your  doctor for other ways to control constipation.          Step       Medicine       Directions   1 You should start taking these medicines the same day you start taking opioids (pain medicine).   Senna 8.6 mg tablets Take 1 to 2 tablets, two times a day.     Over the counter- consult your pharmacist These medicines will help prevent constipation. Take them every day that you take opioids.  If your bowels become loose, you may need to take fewer pills.   Docusate (Colace) 100 mg capsules Take 1 to 4 capsules by mouth, two times a day.  Over the counter- consult your pharmacist    2 If you have not had a bowel movement in 48 hours, CALL YOUR DOCTOR. Your doctor may tell you to use one of the medicines listed below.   Milk of Magnesia 400mg /36mL (not concentrate)  Over the counter- consult your pharmacist Take 2 to 4 tablespoonfuls by mouth, every 8 hours until you have a bowel movement.   Polyethylene glycol 3350 (Miralax)    Over the counter- consult your pharmacist Mix one capful (17 grams) of powder in 8 ounces of water, juice, soda, sports drink, coffee, or tea. Use Miralax every 12 hours until you have a bowel movement.   3 Call your doctor again  if you have not had a bowel movement for 24 hours AFTER following steps 1 & 2       When should I call my doctor?  You should call the CLINIC,  if you have any of the following symptoms:   Watery stools (this can be a sign of severe constipation or too much bowel medicine)   No bowel movement in 48 hours   Small stools   Hard stools   Pain with bowel movements   Straining with bowel movements     Names of Opioid Medicines (not a complete list):   Hydrocodone (Vicodin, Norco, Lortab)   Morphine (MSContin, MSIR)   Oxycodone (OxyContin, Percocet, OxyIR, Roxicodone)   Hydromorphone (Dilaudid, Exalgo)   Fentanyl (Duragesic)   Oxymorphone (Opana)   Methadone

## 2017-01-29 NOTE — Nurse Assessment (Signed)
ASSESSMENT    Note started: 01/29/2017  09:18    Received report from night shift RN. Patient alert and oriented X 3. Admitted for L4-5 fusion and gets up with a TLSO.  Rates pain 5/10. Patient has been slow-moving with poor bed mobility, but is walking to the bathroom with encouragement. To have standing xrays done today. Merry Proud NP here and states if she is doing well today, she could go home, but if she wanted to stay another day and get more mobilized, she can go tomorrow. Husband at bedside and is giving patient lots of encouragement.   Care plan reviewed and appropriate.      Lars Mage RN

## 2017-01-29 NOTE — Plan of Care (Signed)
Problem: Patient Care Overview  Goal: Plan of Care Review  Outcome: Ongoing (interventions implemented as appropriate)  Evaluation Note For All Goals    Encouraged patient to change position while in bed, ambulated in the hallway. Small BM noted, pt had dulcolax supp. Patient move with ease when standing up. No pain prn meds was given, pt rested comfortably, interrupted with vital signs taking and blood draw and eventually pt went to the bathroom and decided to walk on the hallway with walker, went back to bed and easily got back to sleep.          Problem: Coping, Compromised Individual (Adult,Obstetrics,Pediatric)  Goal: Effective Coping  Patient will demonstrate the desired outcomes by discharge/transition of care.  Outcome: Ongoing (interventions implemented as appropriate)      Problem: Pain, Chronic (Adult)  Goal: Acceptable Pain/Comfort Level and Functional Ability  Patient will demonstrate the desired outcomes by discharge/transition of care.  Outcome: Ongoing (interventions implemented as appropriate)      Problem: Fall Risk (Adult)  Goal: Absence of Fall  Patient will demonstrate the desired outcomes by discharge/transition of care.  Outcome: Ongoing (interventions implemented as appropriate)      Problem: Constipation (Adult)  Goal: Identify Related Risk Factors and Signs and Symptoms  Related risk factors and signs and symptoms are identified upon initiation of Human Response Clinical Practice Guideline (CPG).  Outcome: Ongoing (interventions implemented as appropriate)    Goal: Effective Bowel Elimination  Patient will demonstrate the desired outcomes by discharge/transition of care.  Outcome: Ongoing (interventions implemented as appropriate)    Goal: Comfort  Patient will demonstrate the desired outcomes by discharge/transition of care.  Outcome: Ongoing (interventions implemented as appropriate)

## 2017-01-30 ENCOUNTER — Other Ambulatory Visit: Payer: Self-pay

## 2017-01-30 LAB — BASIC METABOLIC PANEL
CALCIUM: 8.4 mg/dL — AB (ref 8.6–10.5)
CARBON DIOXIDE TOTAL: 24 mmol/L (ref 24–32)
CHLORIDE: 99 mmol/L (ref 95–110)
CREATININE BLOOD: 0.89 mg/dL (ref 0.44–1.27)
GLUCOSE: 137 mg/dL — AB (ref 70–99)
POTASSIUM: 3.6 mmol/L (ref 3.3–5.0)
SODIUM: 135 mmol/L (ref 135–145)
UREA NITROGEN, BLOOD (BUN): 9 mg/dL (ref 8–22)

## 2017-01-30 MED ORDER — OXYCODONE ER 10 MG TABLET,CRUSH RESISTANT,EXTENDED RELEASE 12 HR
10.0000 mg | EXTENDED_RELEASE_ORAL_TABLET | Freq: Two times a day (BID) | ORAL | Status: DC
Start: 2017-01-30 — End: 2017-01-30

## 2017-01-30 MED ORDER — DIAZEPAM 5 MG TABLET
5.0000 mg | ORAL_TABLET | Freq: Three times a day (TID) | ORAL | 0 refills | Status: AC | PRN
Start: 2017-01-30 — End: 2017-03-02
  Filled 2017-01-30: qty 30, 10d supply, fill #0

## 2017-01-30 MED ORDER — BACLOFEN 10 MG TABLET
10.0000 mg | ORAL_TABLET | Freq: Three times a day (TID) | ORAL | Status: DC
Start: 2017-01-30 — End: 2017-01-30
  Administered 2017-01-30: 10 mg via ORAL
  Filled 2017-01-30: qty 1

## 2017-01-30 MED ORDER — OXYCODONE 5 MG TABLET
5.0000 mg | ORAL_TABLET | ORAL | 0 refills | Status: AC | PRN
Start: 2017-01-30 — End: 2017-03-02
  Filled 2017-01-30: qty 90, 8d supply, fill #0

## 2017-01-30 MED ORDER — HYDROMORPHONE 1 MG/ML INJECTION SYRINGE
1.0000 mg | INJECTION | Freq: Once | INTRAMUSCULAR | Status: AC
Start: 2017-01-30 — End: 2017-01-30
  Administered 2017-01-30: 1 mg via INTRAVENOUS
  Filled 2017-01-30: qty 1

## 2017-01-30 MED ORDER — DOCUSATE SODIUM 100 MG CAPSULE
200.0000 mg | ORAL_CAPSULE | Freq: Two times a day (BID) | ORAL | 0 refills | Status: DC
Start: 2017-01-30 — End: 2017-02-16
  Filled 2017-01-30: qty 60, 15d supply, fill #0

## 2017-01-30 MED ORDER — BACLOFEN 10 MG TABLET
5.0000 mg | ORAL_TABLET | Freq: Three times a day (TID) | ORAL | 0 refills | Status: DC
Start: 2017-01-30 — End: 2017-03-06
  Filled 2017-01-30: qty 45, 30d supply, fill #0

## 2017-01-30 MED ORDER — OXYCODONE ER 10 MG TABLET,CRUSH RESISTANT,EXTENDED RELEASE 12 HR
10.0000 mg | EXTENDED_RELEASE_ORAL_TABLET | Freq: Every day | ORAL | 0 refills | Status: AC
Start: 2017-01-30 — End: 2017-03-02
  Filled 2017-01-30: qty 30, 30d supply, fill #0

## 2017-01-30 MED ORDER — TRAMADOL 50 MG TABLET
50.0000 mg | ORAL_TABLET | Freq: Four times a day (QID) | ORAL | 0 refills | Status: DC | PRN
Start: 2017-01-30 — End: 2017-02-07
  Filled 2017-01-30: qty 30, 8d supply, fill #0

## 2017-01-30 MED ORDER — SENNOSIDES 8.6 MG TABLET
2.0000 | ORAL_TABLET | Freq: Every day | ORAL | 0 refills | Status: AC
Start: 2017-01-30 — End: 2018-01-25
  Filled 2017-01-30: qty 60, 30d supply, fill #0

## 2017-01-30 NOTE — Discharge Summary (Addendum)
[facility] DISCHARGE SUMMARY    Note Started: 01/30/2017, 13:20  Admission Date: 01/26/2017  9:39 AM  Discharge Date: 01/30/2017   ADMISSION DIAGNOSIS: Spondylolisthesis, lumbar region [M43.16]  Spinal stenosis of lumbar region without neurogenic claudication [M48.061]    COMORBIDITIES & ACTIVE PROBLEM LISTS:  Patient Active Problem List    Diagnosis Date Noted    Elevated TSH 10/20/2016    Tear of left supraspinatus tendon 09/14/2016    Severe osteoarthritis of left AC (acromioclavicular) joint 09/14/2016    Mixed hypercholesterolemia and hypertriglyceridemia     Basal cell carcinoma of skin of nose 04/14/2016    Basal cell carcinoma 04/14/2016    Osteopenia 04/14/2016    Recurrent major depressive disorder, in partial remission ([place]) 04/14/2016    Calcific tendinitis of left shoulder 03/27/2016    Pain in joint of left shoulder 03/27/2016    Migraines 02/11/2016    Insomnia 02/11/2016    Arthritis of carpometacarpal (CMC) joint of right thumb 02/11/2016    Asthma 02/11/2016    HLD (hyperlipidemia) 02/11/2016    Spondylolisthesis of lumbar region 02/11/2016     Overview Note:     L4-5      Foraminal stenosis of lumbar region 02/11/2016     Overview Note:     L3-4; L4-5      Lumbar radicular pain 02/11/2016    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     Past Medical History:   Diagnosis Date    Asthma 02/11/2016    Basal cell carcinoma 04/14/2016    Basal cell carcinoma of skin of nose 04/14/2016    Depression     Elevated TSH 10/20/2016    Mixed hypercholesterolemia and hypertriglyceridemia     Osteopenia 04/14/2016    Recurrent major depressive disorder, in partial remission ([place]) 04/14/2016    Severe osteoarthritis of left AC (acromioclavicular) joint 09/14/2016    Tear of left supraspinatus tendon 09/14/2016        DISCHARGE DIAGNOSIS: same    PAST MEDICAL HISTORY:  Past Medical History:   Diagnosis Date    Asthma 02/11/2016    Basal cell carcinoma 04/14/2016    Basal cell  carcinoma of skin of nose 04/14/2016    Depression     Elevated TSH 10/20/2016    Mixed hypercholesterolemia and hypertriglyceridemia     Osteopenia 04/14/2016    Recurrent major depressive disorder, in partial remission ([place]) 04/14/2016    Severe osteoarthritis of left AC (acromioclavicular) joint 09/14/2016    Tear of left supraspinatus tendon 09/14/2016       OPERATIONS / PROCEDURES:  01/26/17, Dr. Charlann Noss:  1. L4 laminectomy, bilateral L3-4 and L4-5 foraminotomies  2. L4 to L5 arthrodesis/instrumentation using pedicle screws x 4, rods x 2 with in-situ autograft and InFuse (small)  Findings:  Good decompression of lumbar thecal sac and exiting nerve roots.      COMPLICATIONS/INFECTIONS:  None    CONSULTATIONS:  PAIN PHARMACIST CONSULT    HISTORY OF PRESENT ILLNESS:  Erica Hancock is a 57yr female with chief complaint of low back pain radiating down her legs bilaterally to the ankles that is also occasionally associated with numbness.  Her back pain is possibly 50% at rest leg pain.  Her back pain is exacerbated with activity and she is having difficulty sleeping as result of pain and inability to find a comfortable position.  She has tried nonsurgical treatment including medications and injections from pain clinic without significant relief.  Due to the  fact that her Cardiolite is severely compromised she would like to pursue surgical intervention.  Dr.Myah Hancock reviewed her lumbar x-rays and MRI which showed L4-5 degenerative spondylolisthesis with abnormal motion and spinal stenosis at L4-5 and L3-4.  Dr. Maudie Hancock felt aggressive decompression associated with instrumented stabilization with maximized opportunity for improvement.  Dr.Marlys Hancock talked to her regarding the surgery and the possibility that she may not improve to a significant degree and also the risks associate with surgery.  She still wished to proceed and signed informed consent.      HOSPITAL COURSE:   Erica Hancock underwent the surgical intervention as  described by Dr. Maudie Hancock.  Post operatively moves very slowly with PT/OT.  They cleared her for discharge home with FWW and home health PT/OT.   Patient will benefit from use of FWW for ADL within the home to go to bathroom, kitchen, and bedroom as crutch or cane will not resolve mobility limitations for 99 months.  She complaint of increase pain.  She was given Tramadol qid, oxycodone, oxyContin, and baclofen.  At the current time, the patient is able to participate in acitivity of daily living. She is steady on  her feet and able to ambulate without any assist.  She is voiding and tolerating a regular diet.  Pain is well controlled with oral medications.  Discussed with patient need to continue to try to taper the pain medications and decrease usage over time. It is anticipated that the additional medication will only be used short term post-operatively.    DISCHARGE EXAM:  BP 114/68   Pulse 93   Temp 37.2 C (99 F) (Oral)   Resp 16   Ht 1.626 m (5\' 4" )   Wt 72.1 kg (158 lb 15.2 oz)   SpO2 99%   BMI 27.28 kg/m   GENERAL: awake, alert,  NAD  GCS: E4 M6 V5   PUPILS: Right- 3 mm Brisk Left- 3 mm Brisk  SPEECH: Clear  CN II-XII: grossly intact  MOTOR: Pronator Drift: no   Deltoid  Biceps  Triceps  Wrist Flexion  Wrist Extension  Hand Intrinsics    Right  5 5 5 5 5  5    Left  5 5 5 5 5 5       Hip Flexion  Knee Extension  Knee Flexion  Dorsiflex  Great Toe Extension  Plantar Flexion    Right  5 5 5 5 5 5    Left  5 5 5 5 5 5      SENSORY: LT intact in all extremities  SURGICAL WOUND: Well approximated with dermabond  PULM: stable on RA  CV HDS  ID: No active issues  GI:  tolerating regular diet.   GU: Voiding spontaneously    Procedures during Hospitalization:  EVD Placed: no  Intubated: yes   PEG: no   Trach: no   Complications during Hospitalization   UTI: no   PNA: no   DVT: no   Infection: no   Hospital Seizure: no   DISCHARGE INSTRUCTIONS:  1. Do not drive while taking narcotic pain medication.   2. Check your  incision daily for redness, tenderness or drainage.   3. Erica Hancock has been instructed to contact our office should she have any feversgreater than 101, new weakness, new numbness, increased headaches associated with  n/v not relieved with pain medication, difficulty with ambulation or sudden change in bowel or bladder function, or opening or drainage from the incisions  4. Do not take  ibuprofen, aspirin or aspirin related products unless cleared by Neurosurgeon.    The patient's past medical, family, and social history was reviewed and confirmed.    Erica Hancock was involved in the decision making process and agreed with the plan of care. Questions were sought and answered. She received a copy of all discharge instructions and education material.    DISPOSITION:  Patient is stable for discharge to: home     DISCHARGE FOLLOW UP:  Patient will follow up with:   [facility]  On 03/06/2017  at 3:00 PM at Dr. Maudie Hancock  [address] [place] [address]  [phone]       [place] for DISCHARGE:     Medication List      START taking these medications    Baclofen 10 mg Tablet  Commonly known as:  LIORESAL  Take 0.5 tablets by mouth 3 times daily.  Qty 45     Diazepam 5 mg Tablet  Commonly known as:  VALIUM  Take 1 tablet by mouth every 8 hours if needed.  Qty 30     Docusate 100 mg Capsule  Commonly known as:  COLACE  Take 2 capsules by mouth 2 times daily.  Qty 60     * OxyCODONE 10 mg SR Tablet  Commonly known as:  OXYCONTIN  Take 1 tablet by mouth every day at bedtime.  Qty 30     * Oxycodone 5 mg Tablet  Commonly known as:  ROXICODONE  Take 1-2 tablets by mouth every 4 hours if needed.  Qty 90     Sennosides 8.6 mg Tablet  Commonly known as:  SENOKOT  Take 2 tablets by mouth every day at bedtime.        CHANGE how you take these medications    Tramadol 50 mg Tablet  Commonly known as:  ULTRAM  Take 1 tablet by mouth every 6 hours if needed.   qty 40          CONTINUE  taking these medications    Amitriptyline 50 mg Tablet  Commonly known as:  ELAVIL  Take 1 tablet by mouth every day at bedtime. Indications: depression     ESTRACE 0.01% Vaginal Cream  Generic drug:  Estradiol  Insert into the vagina three times a week.     PEG 3350-Electrolytes-Vit C 100-7.5-2.691 gram Powder in Packet  Commonly known as:  MOVIPREP  Take as directed on gi lab instruction sheet (may substitute with gavilyte-C)     QVAR INHA        STOP taking these medications    CeleBREX 100 mg Capsule  Generic drug:  Celecoxib     Miscellaneous Medication           Where to Get Your Medications      These medications were sent to [facility], [place] [address]    Hours:  0800-1900 Phone:  [phone]    Baclofen 10 mg Tablet   Diazepam 5 mg Tablet   Docusate 100 mg Capsule   OxyCODONE 10 mg SR Tablet   Oxycodone 5 mg Tablet   Sennosides 8.6 mg Tablet   Tramadol 50 mg Tablet           A total of 90 minutes was spent coordinating patient discharge.     Report Completed by:      Thresa Ross MSN, RN, FNP-C, PA-C, CNRN,CCRN  Family Nurse Practitioner/ Physician Assistant  Department Neurological Surgery   Pager # [phone]  Service Pager after [phone]    This patient was seen, evaluated, and care plan was developed.  I agree with the assessment and D/C plan as outlined in the note.  I personally reviewed the postop upright lumbar X-rays that show good alignment and good position of implants.    Report electronically signed by Terrall Laity, MD. Attending

## 2017-01-30 NOTE — Clinical Case Management (Signed)
Clinical Case Management Assessments    Name: Erica Hancock  MRN: [id number]   Date of Birth: 03-24-1955 (17yr) Gender: female    Note Date: 01/30/2017 Note Time: 13:42       INITIAL ASSESSMENT NOTE    Patient able to participate in plan?: Yes        Developmental Level Appropriate (Pediatrics): N/A   Living arrangements: Spouse/Significant other   Type of Residence: private residence  Support system: Spouse  Primary support person: Sherby Moncayo- spouse   Primary contact phone number: [phone]   [place] (Pediatrics): N/A   Pre-Hospital Services: None   Type of home health care services in place: None   DME in place: None       Does the patient have ongoing DC Planning needs?: Yes     Permanent Address: [address]  [place] [address]  Discharge Address:  [address] [address]      Pre-Hospitalization self-care deficits: Appropriate for age   Pre-Hospitalization mobility: Independent   Bladder function: Continent   Bowel function: Continent     Patient can follow-up with:   PCP: Harley Alto, DO  / Phone Number: [phone]  Preferred Pharmacy: [facility], [place], [phone] [place], [place], [phone]    Funding/Billing: Payor: TRICARE / Plan: TRICARE PRIME/ HNFS / Product Type: *No Product type* /    Secondary Insurance:     Comments:   Chart reviewed, and case discussed POC with the team during huddle this morning. Per team, pt is medically stable for DC home today and need a HH for PT/OT and DME- FWW for mobility.  CM spoke with pt at the bedside and confirmed discharge home address, agreeable for [facility] referral. DME- FWW will deliver to bedside before dc today by discharge team.  CM will initiate [facility] referral and will task [place].    CM will continue to follow as needed.     Date/Time: 01/30/2017  13:42  Electronically Signed by:  Lynford Humphrey, RN, BSN  Clinical Case Management  Pager [phone]  Direct [phone]  Office Hours: [phone]

## 2017-01-30 NOTE — Allied Health Progress (Signed)
Physical Therapy Progress Note    Date of Service: 01/30/2017   Time in: 12:15pm  Total time: 20 Minutes    Treatment session 3 of 10 in reporting period.    S: 4 /10 Pain, located in lumbar region.  Patient endorsing anxiety about movement causing pain.  However, thinks she is ready to try to go home to [place] with her husband today, who is an Therapist, sports.  Husband present for training.    O: RN cleared patient to work with PT.  Patient received sitting in chair wearing TLSO.  Sit<->Stand: SBA with FWW (verbal cues).  Transfer: SBA with FWW  Gait: 100' SBA with FWW with very slow velocity, excessive knee flexion throughtout.  Needs cues to improve velocity, continuity, and step symmetry with less reliance on pressing through arms on FWW.  Up/Down 8 stairs with one rail: SBA (verbal cues to fully extend knees and hips when ascending).  Uses step-to pattern, leading with right LE.  Goes down steps backward, leading with left LE, holding rail with both hands.  Gait: 100' SBA with FWW with improved step-through pattern.  Transfer to EOB: SBA with FWW  Patient doffed TLSO with verbal cues.  Sit->Supine: SBA with verbal cues for proper body mechanics (HOB flat, no rail).  Care transferred to RN.    A: Patient has met acute PT goals and appears safe to return home with husband.  Recommend FWW and home health PT.    Patient appears to have anxiety contributing to abnormal movements.    P: See DC summary.    Wende Bushy, PT, DPT  Physical Therapist II   PI # [id number]  [facility]  Department of PM&R  Acute Care Section  Office: [phone]  Vocera: [phone]  PT Pager: [phone]

## 2017-01-30 NOTE — Nurse Discharge Note (Signed)
Pt received a FWW Durable Medical Equipment (DME) per inpatient case manager [place]  and delivered to unit/patient, receipt signed by pt's husband.

## 2017-01-30 NOTE — Nurse Focus (Addendum)
0045: Routine rounds done and plan to give Valium PRN as part of pain control management, but pt too sleepy this time, awakened thru name calling and shoulder tapping, no other neuro changes noted. Last pain med given was oxy 5 mg at 2059H, valium was last given at 1718H. Lidocaine patches given on bilateral hips instead.  VSS.  Will cont to monitor.      0400H : Back to bed from BR and pt c/o severe pain on back and legs inspite of lidocaine patches on bilateral hips and po oxy 5 mg. States " I can't take this anymore, the pain is getting worse each day". Discussed w/ her that the nerves are still irritated and swollen from sugery and pain gets more worse with activity esp the first time she gets up in the morning. NGY notified and discussed w/ Dr Philmore Pali re recent events and how sensitive pt w/ pain meds - with orders. Admin supplemental O2 at 2lpm via nc for support after 1 mg IV dilaudid.     Will cont to monitor.      Becky [place], RN

## 2017-01-30 NOTE — Discharge Planning (AHS/AVS) (Signed)
Your Doctor has ordered [place] for you your services will be provided by: [facility] [phone]         Your home health agency will be contacting you within 24-48 hours for scheduling. If you have any questions regarding your discharge planning arrangement please call the Clinical Case Management Department at [phone] Monday-Friday 8:00 a.m. to 5:00 p.m. for assistance.

## 2017-01-30 NOTE — Nurse Assessment (Signed)
ASSESSMENT NOTE    Note Started: 01/30/2017, 11:18     Initial assessment completed and recorded in EMR.  Report received from night shift nurse and orders reviewed. Plan of Care reviewed and appropriate, discussed with patient and family. POD:  4  S/P: L4-5 fusion, L3-5 lami  Pt's husband at the bedside and assisting patient this am.  Ambulated with patient in the hallway with walker and gait slow and steady.  Pt's back incision dressing removed by NP in the am.  Later at 1045 pt's hemovac drain site oozing moderate amount of serosangineous drainage and site dressing changed.  NP plans to remove the Hemovac and discharge pt. To home.  Called DC planner to order walker for discharge.         Clarene Critchley, RN

## 2017-01-30 NOTE — Allied Health Progress (Signed)
Occupational Therapy Progress Note    Date of Service: 01/30/2017   Time in: 1130  Total time: 30 Minutes        S: 3 /10 Pain, located in lower back. Husband present for session. RN cleared for therapy. Pt received supine in bed agreeable to session.     O: Bed mobility:  SUPERVISED with HOB slightly elevated and verbal cue to log roll. Verbal cues for breathing coordinated with movements. Pt verbalizing husband will help at home as needed.  Transfer:SBA/CGA EOB -> standing-> bedside chair with FWW and verbal cue to avoid twisting during transitions.   Upper body dressing: SUPERVISED to don TLSO and shirt seated at EOB   Lower body dressing:  LH reacher and sock aid to don socks and pants seated at EOB, SUPERVISED    Pt and CG educated on safe body mechanics for bathing tasks, transfers and proper footwear for fall prevention.  CG able to verballze understanding and will purchase tub chair and LH sponge for bathing. CG declined printed handout of DME recommendations.  Pt required 1 reminder for spinal precautions prior to transitioning OOB for therapy     A: Pt with fair+  tolerance and receptive to recommendations for energy conservation, fall prevention and progression of function for safe DC home with husband providing support. He is a retired Therapist, sports. Recommend tub bench and FWW for safety.     P: continue with OT POC for safe DC home with 24 hour CG support , OTR, RN and PT updated on Pt status. Pt and CG with no further questions at this time.    Macy Mis, (ta) COTA/L PI# [id number]

## 2017-01-30 NOTE — Allied Health Consult (Signed)
PM&R -- ACUTE CARE SERVICE  PHYSICAL THERAPY DISCHARGE REPORT     Name: Erica Hancock  MRN: [id number]   Discharge Date: 01/30/17    Initial Treatment Date:  01/27/17    Date of Admission: 01/26/2017  Date of Onset:  01/26/17     Primary Service:  (A) Neurosurgery    Principal and Significant Associated Diagnoses:  L4/L5 Spondylolisthesis & stenosis    Functional Status:  See PT progress note from 01/30/17  Key: Dep=Dependent     Max=Maximal     Mod=Moderate     Min=Minimal     CG=Contact Guard     SB=Stand-By     S=Supervised     I=Independent     NA=Not Applicable     NT=Not Tested     FWW=Front-Wheeled Walker     AC=Axillary Crutches     SPC=Single-Point Cane       Level of Assistance Dep Max Mod Min CG SB S I NA NT Comments:   Rolling        X        Scooting        X        Sidelying/ Supine to Sit      X     HOB flat, no rail   Transfer Sit to Stand      X     With FWW   Transfer Bed to Chair      X     With FWW   Gait        X     100' X 2 with FWW   Up/Down Step/Stairs      X     8 stairs with rail   Other:  Patient benefiting from verbal cues for safe technique, body mechanics, and to reduce anxiety throughout session.    Discharge Recommendations:        Continued Physical Therapy:  Home Health PT                            Level of Assistance or Supervision:  Husband to provide 24/7, as needed.         Equipment:  FWW        Home Exercise Program: Walking program.    Disposition:  Home    Patient / Caregiver Training Completed:  Yes    Comments:  Husband able to cue and guard patient appropriately.     Treatment Goals And Objectives:  Met goals    Functional Limitation Goal:   M2263 - The Patient's Primary Functional Limitation is/was Mobility (Walking and moving around); the goal addressed Mobility.  Projected Severity Modifier:  CI- At least 1% but less than 20% impaired, limited or restricted    Functional Limitation Status at Discharge:   [id number] - The Patient's Primary Functional Limitation reported upon  during this treatment episode is/was Mobility (walking & moving around).  Severity at Discharge:    CJ- At least 20% but less than 40% impaired, limited or restricted    Interim Report and/ or Re-Evaluation Date(s):  N/A    Reported by:  Wende Bushy, PT, DPT  Physical Therapist II   PI # [id number]  [facility]  Department of PM&R  Acute Care Section  Office: [phone]  Vocera: [phone]  PT Pager: [phone]

## 2017-01-30 NOTE — Discharge Planning (AHS/AVS) (Signed)
Discharge Reception Area -RN will deliver a FWW from [facility]  to your room prior to discharge.      --------------------------------------------    If you have any questions or concerns related to the services listed above that were coordinated for your discharge, please contact [facility] Clinical Case Management  [phone].

## 2017-01-30 NOTE — Nurse Focus (Signed)
Pt discharged to home with her husband at 57 via wheelchair and escort.  Both pt. And her husband say they understand the DC instuctions and they are ready to go home. Pt has her front wheeler walker and the new medications from out pt. Pharmacy.   Clarene Critchley  RN

## 2017-01-30 NOTE — Plan of Care (Signed)
Problem: Patient Care Overview  Goal: Plan of Care Review  Outcome: Ongoing (interventions implemented as appropriate)  Evaluation Note For All Goals  Neuro status at baseline. VSS.Still c/o breakthrough pains worse when OOB to BR and activity inspite of po pain meds and lidocaine. Discussed w/ Dr Valentina Shaggy worse pain when OOB at the same time being sensitive to pain meds. Plan for pharmacy consult. Tolerates OOb to BR w/ 1 person assist and able to void spont to yellow urine. Had a small BM yesterday. Supplemental O2 at 2lpm nc admin while asleep. Pt free from injury.       Goal: Individualization and Mutuality  Outcome: Ongoing (interventions implemented as appropriate)    Goal: Discharge Needs Assessment  Outcome: Ongoing (interventions implemented as appropriate)    Goal: Interprofessional Rounds/Family Conf  Outcome: Ongoing (interventions implemented as appropriate)      Problem: Coping, Compromised Individual (Adult,Obstetrics,Pediatric)  Goal: Identify Related Risk Factors and Signs and Symptoms  Related risk factors and signs and symptoms are identified upon initiation of Human Response Clinical Practice Guideline (CPG).  Outcome: Outcome(s) achieved Date Met: 01/30/17    Goal: Effective Coping  Patient will demonstrate the desired outcomes by discharge/transition of care.  Outcome: Ongoing (interventions implemented as appropriate)      Problem: Pain, Chronic (Adult)  Goal: Identify Related Risk Factors and Signs and Symptoms  Related risk factors and signs and symptoms are identified upon initiation of Human Response Clinical Practice Guideline (CPG).  Outcome: Ongoing (interventions implemented as appropriate)    Goal: Acceptable Pain/Comfort Level and Functional Ability  Patient will demonstrate the desired outcomes by discharge/transition of care.  Outcome: Ongoing (interventions implemented as appropriate)      Problem: Fall Risk (Adult)  Goal: Identify Related Risk Factors and Signs and  Symptoms  Related risk factors and signs and symptoms are identified upon initiation of Human Response Clinical Practice Guideline (CPG).  Outcome: Outcome(s) achieved Date Met: 01/30/17    Goal: Absence of Fall  Patient will demonstrate the desired outcomes by discharge/transition of care.  Outcome: Ongoing (interventions implemented as appropriate)      Problem: Constipation (Adult)  Goal: Identify Related Risk Factors and Signs and Symptoms  Related risk factors and signs and symptoms are identified upon initiation of Human Response Clinical Practice Guideline (CPG).  Outcome: Outcome(s) achieved Date Met: 01/30/17    Goal: Effective Bowel Elimination  Patient will demonstrate the desired outcomes by discharge/transition of care.  Outcome: Ongoing (interventions implemented as appropriate)    Goal: Comfort  Patient will demonstrate the desired outcomes by discharge/transition of care.  Outcome: Ongoing (interventions implemented as appropriate)

## 2017-01-30 NOTE — Nurse Discharge Note (Signed)
Discharge Team Screen:  Spoke to bedside RN Stanton Kidney, per RN patient still needs IV pain, she will check with NP.

## 2017-01-31 ENCOUNTER — Telehealth: Payer: Self-pay | Admitting: Family Medicine

## 2017-01-31 ENCOUNTER — Non-Acute Institutional Stay: Attending: Neurological Surgery

## 2017-01-31 NOTE — Telephone Encounter (Signed)
Post-discharge follow up Phone Call    Provider: PCP: Jeralyn Bennett, MD   Attending  Treatment team      Per chart review:  ADMISSION DIAGNOSIS: Spondylolisthesis, lumbar     OPERATIONS / PROCEDURES:  01/26/17, Dr. Charlann Noss:  1. L4 laminectomy, bilateral L3-4 and L4-5 foraminotomies  2. L4 to L5 arthrodesis/instrumentation using pedicle screws x 4, rods x 2 with in-situ autograft and InFuse (small)    HISTORY OF PRESENT ILLNESS:  Erica Hancock is a 33yr female with chief complaint of low back pain radiating down her legs bilaterally to the ankles that is also occasionally associated with numbness. Her back pain is possibly 50% at rest leg pain. Her back pain is exacerbated with activity and she is having difficulty sleeping as result of pain and inability to find a comfortable position. She has tried nonsurgical treatment including medications and injections from pain clinic without significant relief. Due to the fact that her Cardiolite is severely compromised she would like to pursue surgical intervention       Received an alert from support at [employer] that the patient has questions related to: Equipment and Supplies    Per: patient. Three patient identifiers used.  Spoke with patient who states she has not been contacted by [facility].   -Advised patient [place] will contact her 48 hours post discharge.   -Patient did not have access to a pen and requested I call her back and leave [place] contact information on her VM.   [facility], Contact Phone #: [phone]  --Patient states she needs a raised toilet seat.   Advised patient her DME needs will be assessed by [facility] nurse, PT, and OT once she has her visit with them       Patient to follow up with PCP Clinic if further issues or concerns.   Per: patient verbalized understanding and agrees with plan.    Time Spent to Close: 5 minutes    Philis Pique, RN, BSN  Post-discharge [employer]  [phone] - Post-discharge call  department    Please send all EMR Responses to our work department pool:   P POST [place]

## 2017-02-01 ENCOUNTER — Encounter: Payer: Self-pay | Admitting: Family Medicine

## 2017-02-02 ENCOUNTER — Other Ambulatory Visit: Payer: Self-pay

## 2017-02-02 ENCOUNTER — Other Ambulatory Visit

## 2017-02-02 ENCOUNTER — Telehealth: Payer: Self-pay | Admitting: Neurological Surgery

## 2017-02-02 DIAGNOSIS — Z4789 Encounter for other orthopedic aftercare: Secondary | ICD-10-CM

## 2017-02-02 DIAGNOSIS — M48061 Spinal stenosis, lumbar region without neurogenic claudication: Principal | ICD-10-CM

## 2017-02-02 DIAGNOSIS — M4316 Spondylolisthesis, lumbar region: Secondary | ICD-10-CM

## 2017-02-02 NOTE — Telephone Encounter (Signed)
Received incoming call from patients husband Erica Hancock says wife has big sore around mouth area think its from tube when she was intubated. Wants to send in pictures but does not have mychart. Please advise.    Jola Babinski, [place], Surgery Authorizations  [phone]  Sp[facility] Center/Neurosurgery

## 2017-02-02 NOTE — Telephone Encounter (Addendum)
Dr. Maudie Mercury patient  Action Needed  Surgery(Kim): 01/26/17, 01/26/17, L4 laminectomy, bilateral L3-4 and L4-5 foraminotomies. L4 to L5 arthrodesis/instrumentation using pedicle screws x 4, rods x 2 with in-situ autograft and InFuse (small).    S/w patients husband, he verified name, DOB, MRN and/or address at initiation of call.  John reported he is a Marine scientist and mentioned the patient is doing well overall however "her left lower lip in the corner is red, possibly related to intubation. Need to make sure it doesn't get infected. She had not had a BM x 10 days so on Wednesday she was given Mag Citrate, she has had a few small hard stools. She is currently taking senna, and colace".  Incision: John reports, closed, well approximated, slight redness at top of incision(reports looks normal), denies any tenderness, edema, drainage, or warmth to touch.  Pain: Manageable, Reports patient is taking ALL medication listed below. Takes Baclofen during the day, not at night reports she is to drowsy. She is also alternating her Oxycodone 5 mg and Tramadol every 6 hours. Husband is in the process of attempting to wean patient.      Medication List   START taking these medications     Baclofen 10 mg Tablet Sig: Take 0.5 tablets by mouth 3 times daily. Qty 45    Docusate 100 mg Capsule Sig: Take 2 capsules by mouth 2 times daily. Qty 60    * OxyCODONE 10 mg SR Tablet Sig:  Take 1 tablet by mouth every day at bedtime. Qty 30    * Oxycodone 5 mg Tablet  Sig: Take 1-2 tablets by mouth every 4 hours if needed. Qty 90    Sennosides 8.6 mg Tablet Sig: Take 2 tablets by mouth every day at bedtime.    CHANGE how you take these medications     Tramadol 50 mg Tablet  Sig: Take 1 tablet by mouth every 6 hours if needed. Qty [place] will email picture of cut on her lip, will forward message once received.

## 2017-02-03 ENCOUNTER — Other Ambulatory Visit

## 2017-02-03 NOTE — Telephone Encounter (Signed)
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

## 2017-02-03 NOTE — Telephone Encounter (Signed)
Patient identified x 3; Lat Name: Mccullar -DOB:  05-26-1955- Address: [address] [address]    Mrs Perriello states that she has not been taking Elavil, since prior to surgery.   She is going to stop taking the Tramadol, because when she takes it she gets very groggy. She would rather go back to taking Elavil.  She should follow up with PCP in regards to her medications.     Mrs Ozier and her husband voiced understanding and she  Is to [place] taking [place] PA-C  Neurological Surgery Department.

## 2017-02-06 ENCOUNTER — Other Ambulatory Visit

## 2017-02-06 ENCOUNTER — Other Ambulatory Visit: Payer: Self-pay

## 2017-02-07 ENCOUNTER — Other Ambulatory Visit

## 2017-02-07 ENCOUNTER — Other Ambulatory Visit: Payer: Self-pay

## 2017-02-07 ENCOUNTER — Other Ambulatory Visit: Payer: Self-pay | Admitting: Neurological Surgery

## 2017-02-09 ENCOUNTER — Other Ambulatory Visit

## 2017-02-09 ENCOUNTER — Other Ambulatory Visit: Payer: Self-pay

## 2017-02-10 ENCOUNTER — Other Ambulatory Visit

## 2017-02-13 ENCOUNTER — Other Ambulatory Visit

## 2017-02-13 ENCOUNTER — Other Ambulatory Visit: Payer: Self-pay

## 2017-02-14 ENCOUNTER — Other Ambulatory Visit: Payer: Self-pay

## 2017-02-15 ENCOUNTER — Other Ambulatory Visit

## 2017-02-16 ENCOUNTER — Other Ambulatory Visit

## 2017-02-16 ENCOUNTER — Emergency Department
Admission: AD | Admit: 2017-02-16 | Discharge: 2017-02-16 | Disposition: A | Discharge status: Home / Self Care | Attending: Medical | Admitting: Medical

## 2017-02-16 ENCOUNTER — Emergency Department

## 2017-02-16 ENCOUNTER — Telehealth: Payer: Self-pay | Admitting: Neurological Surgery

## 2017-02-16 DIAGNOSIS — M79605 Pain in left leg: Secondary | ICD-10-CM | POA: Insufficient documentation

## 2017-02-16 DIAGNOSIS — M79662 Pain in left lower leg: Principal | ICD-10-CM

## 2017-02-16 DIAGNOSIS — M7989 Other specified soft tissue disorders: Secondary | ICD-10-CM | POA: Insufficient documentation

## 2017-02-16 NOTE — ED Nursing Note (Signed)
Wilhelm, PA at bedside.

## 2017-02-16 NOTE — Discharge Instructions (Signed)
DC Instructions  Thank you for choosing [facility] for your emergency health care needs. It has been our privilege to take care of you today. Your primary complaints have been evaluated based on your history, lab tests, imaging tests and you have been treated for your symptoms and discharged home. Please take all medicines that are prescribed to you as directed (see below). It is crucial, if you have a primary care physician, to follow up with him or her in the time frame recommended as many health conditions that seem self-limited initially may actually worsen over time. If you do not have a primary care physician, we will outline the various resources available for you to find one.    If at any time you feel that your condition is worsening, call your doctor or return to the Emergency Department for reevaluation. CALL 911 IF YOU THINK YOU ARE HAVING A MEDICAL EMERGENCY. Return to the Emergency Department if you are unable to obtain the recommended follow-up treatment or you are not better as expected. You can call the [facility] with questions at [phone].    Please realize that the results of some studies that you had done during your stay with [place] (such as x-rays and cultures) have only preliminarily results at this time. Results of these studies may change as more information becomes available or as the studies are re-evaluated by other members of our health care team in the next few days. We will attempt to contact you with any important changes or additions to the studies that were obtained today, particularly if any of these results require a change in your treatment.    Follow with your doctor in 2 days.

## 2017-02-16 NOTE — ED Nursing Note (Signed)
Pt ambulated to bathroom 

## 2017-02-16 NOTE — ED Nursing Note (Signed)
Pt c/o left medial and lateral ankle pain with palpation, +edema to site. Pain radiates to knee with increased pain with foot flexion.

## 2017-02-16 NOTE — ED Nursing Note (Signed)
Patient discharged in stable condition. NAD noted. VS Stable. All belongings sent with patient. Patient given discharge instructions verbally and on paper. All questions answered. Patient verbalized understanding. Patient ambulated with steady even gait to exit alongside SO.

## 2017-02-16 NOTE — ED Provider Notes (Signed)
Chief Complaint   Patient presents with    Calf Pain with Ambulation (DVT)       HPI: Erica Hancock is a 61yr female who presents with left lower leg swelling and pain.  Patient had low back surgery 01/26/2017.  States the incision is healing normally, has had no fever.  Patient states that when she sleeps she sleeps with pillows under her knees placing her upper part of her lower extremities pointing toward the ceiling in the lower extremities in a bent position on the bed.  Patient states she is noticed increased swelling and pain especially if she wakes up. Denies fever, nausea, vomiting, diarrhea, headache, dyspnea, chest pain, abdominal pain, edema.  No recent trauma per patient.    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 09/14/2016    Tear of left supraspinatus tendon 09/14/2016       Past Surgical History:   Procedure Laterality Date    INJECTION, STEROID      Right thumb    LAMINECTOMY, LUMBAR, POSTERIOR APPROACH  01/26/2017    L4 laminectomy, bilateral L3-4 and L4-5 foraminotomies    PR ARTHRODESIS POSTERIOR/POSTEROLATERAL LUMBAR  01/26/2017    L4-5 arthrodesis/instrumentation using pedicle screws x 4, rods x 2 with in-situ autograft and InFuse (small)    PR COLONOSCOPY FLX DX W/COLLJ SPEC WHEN PFRMD  2006    no polyps per pt    PR HAND/FINGER SURG PROC UNLISTED Right     thumb    PR VAGINAL HYSTERECTOMY,UTERUS 250 GMS/<      one ovary removed,  couldn't find other    TONSILLECTOMY         Family History   Problem Relation Name Age of Onset    Hypertension Mother      Heart Disease Mother      Blood Disease Father      Breast Cancer Sister          dx age 17       Social History     Socioeconomic History    Marital status: MARRIED     Spouse name: Not on file    Number of children: Not on file    Years of education: Not on file    Highest education level: Not on file   Social Needs    Financial resource strain: Not on file    Food insecurity - worry: Not on file    Food insecurity - inability: Not on file    Transportation needs - medical: Not on file    Transportation needs - non-medical: Not on file   Occupational History    Occupation: self employed     Comment: Aeronautical engineer   Tobacco Use    Smoking status: Former Smoker     Packs/day: 1.00     Years: 12.00     Pack years: 12.00     Types: Cigarettes     Last attempt to quit: 03/01/1979  Years since quitting: 37.9    Smokeless tobacco: Never Used   Substance and Sexual Activity    Alcohol use: Yes     Alcohol/week: 3.6 oz     Types: 6 Glasses of wine per week     Comment: 1/day    Drug use: Yes     Frequency: 1.0 times per week     Types: Marijuana     Comment: once daily oil    Sexual activity: Not on file   Other Topics Concern    Not on file   Social History Narrative    Not on file       Allergies:   Epinephrine    Tachycardia    Comment:per [place] (Sulfonamide Antibiotics)    Anaphylaxis  Suture    N/A    Comment:Patient states skin "goes to mush"    Medication: No current facility-administered medications for this encounter.     Current Outpatient Medications:     Amitriptyline (ELAVIL) 50 mg Tablet, Take 50 mg by mouth every day at bedtime.       , Disp: , Rfl:     Baclofen (LIORESAL) 10 mg Tablet, Take 1/2 tablet (5 mg) by mouth 3 times daily., Disp: 45 tablet, Rfl: 0    BECLOMETHASONE DIPROPIONATE (QVAR INHA), Take 1 puff by inhalation  once daily if needed.       , Disp: , Rfl:     Diazepam (VALIUM) 5 mg Tablet, Take 1 tablet by mouth every 8 hours if needed. (Patient taking differently: Take 5 mg by mouth every 8 hours if needed for anxiety.       ), Disp: 30 tablet, Rfl: 0    ESTRACE 0.01 % (0.1 mg/gram) Vaginal Cream, Insert into the vagina three times a week. (Patient taking differently: Insert 1 g into the vagina As Directed. Insert into the vagina three times a week.      ), Disp: 42.5 g, Rfl: 3    Fluticasone (ALLER-FLO) 50 mcg/actuation nasal spray, Spray 1-2 sprays into each nostril two times daily if needed., Disp: , Rfl:     lactase (LACTAID FAST ACT PO), Take 1 Caplet by mouth once daily if needed. With meal as needed      , Disp: , Rfl:     OxyCODONE (OXYCONTIN) 10 mg SR Tablet, Take 1 tablet by mouth every day at bedtime., Disp: 30 tablet, Rfl: 0    Oxycodone (ROXICODONE) 5 mg Tablet, Take 1-2 tablets by mouth every 4 hours if needed. (Patient taking differently: Take 5-10 mg by mouth every 4 hours if needed for pain.       ), Disp: 90 tablet, Rfl: 0    Sennosides (SENOKOT) 8.6 mg Tablet, Take 2 tablets by mouth every day at bedtime. (Patient taking differently: Take 2 tablets by mouth every day at bedtime.       ), Disp: 60 tablet, Rfl: 0    ROS:  Review of Systems   Constitutional: Negative for activity change, chills, diaphoresis, fatigue, fever and unexpected weight change.   HENT: Negative for congestion, ear discharge, ear pain, facial swelling, mouth sores, nosebleeds, postnasal drip, rhinorrhea, sinus pressure, sneezing and sore throat.    Eyes: Negative for pain, discharge, redness, itching and visual disturbance.   Respiratory: Negative for cough, choking, chest tightness, shortness of breath, wheezing and stridor.    Cardiovascular: Negative for chest pain, palpitations and leg swelling.   Gastrointestinal: Negative for abdominal distention,  abdominal  pain, blood in stool, constipation, diarrhea, nausea, rectal pain and vomiting.   Endocrine: Negative for polydipsia, polyphagia and polyuria.   Genitourinary: Negative for decreased urine volume, dysuria, enuresis, flank pain, frequency, hematuria and urgency.   Musculoskeletal: Positive for joint swelling. Negative for arthralgias, back pain, gait problem, myalgias and neck pain.   Skin: Negative for color change, pallor, rash and wound.   Allergic/Immunologic: Negative for environmental allergies, food allergies and immunocompromised state.   Neurological: Negative for dizziness, tremors, syncope, light-headedness, numbness and headaches.   Hematological: Negative for adenopathy. Does not bruise/bleed easily.   Psychiatric/Behavioral: Negative for agitation, confusion, hallucinations, self-injury, sleep disturbance and suicidal ideas. The patient is not nervous/anxious.        PE:  Physical Exam   Constitutional: She is oriented to person, place, and time. She appears well-developed and well-nourished. No distress.   HENT:   Head: Normocephalic and atraumatic.   Nose: Nose normal.   Mouth/Throat: Oropharynx is clear and moist. No oropharyngeal exudate.   Eyes: EOM are normal. Pupils are equal, round, and reactive to light. Right eye exhibits no discharge. Left eye exhibits no discharge.   Neck: Normal range of motion. Neck supple.   Cardiovascular: Normal rate, regular rhythm and normal heart sounds. Exam reveals no gallop and no friction rub.   No murmur heard.  Pulmonary/Chest: Effort normal and breath sounds normal. No respiratory distress. She has no wheezes. She has no rales. She exhibits no tenderness.   Abdominal: Soft. Bowel sounds are normal. She exhibits no distension and no mass. There is no tenderness. There is no rebound and no guarding.   Musculoskeletal: Normal range of motion. She exhibits edema and tenderness. She  exhibits no deformity.        Left lower leg: She exhibits tenderness and edema. She exhibits no bony tenderness, no swelling, no deformity and no laceration.        Legs:  Neurological: She is alert and oriented to person, place, and time. She displays normal reflexes. No cranial nerve deficit. Coordination normal.   Skin: Skin is warm and dry. Capillary refill takes less than 2 seconds. No rash noted. She is not diaphoretic. No erythema. No pallor.   Psychiatric: She has a normal mood and affect. Her behavior is normal. Thought content normal.       Temp: 37.1 C (98.7 F) (12/20 1701)  Temp src: Temporal (12/20 1701)  Pulse: 86 (12/20 1800)  BP: 129/55 (12/20 1800)  Resp: 18 (12/20 1800)  SpO2: 94 % (12/20 1800)  Height: 162.6 cm (5\' 4" ) (12/20 1701)  Weight: 70.3 kg (155 lb) (12/20 1701)    Differentials: DVT, PE, dependent edema, cellulitis    Labs: No results found for this visit on 02/16/17.    Radiology:None    Orders Placed This Encounter    [facility] US VENOUS DUPLEX, LEFT LOWER EXTREMITY       MDM  Reviewed: nursing note and vitals        ED Summary: Santa Abdelrahman is a 22yr female with left lower leg edema. Work up in the ED consisted of examination, ultrasound.  Exam reveals no redness, minimal swelling, most swelling is noted to the ankle area.  Ultrasound of the left lower extremity shows no DVT. I discussed the results with the patient along with likely diagnosis. Patient was advised to follow with primary care in 2 days and return to the ED if any redness, worsening pain, fever, concern.    Procedures: None  Clinical Impression:     ICD-10-CM    1. Left leg pain M79.605    2. Pain and swelling of left lower leg M79.662     M79.89        PLAN: 56yr female findings along with testing likely consistent with lower leg pain and swelling. Follow up and return precautions discussed with the patient as above. The patient participated in  the decision making process, verbalized understanding, and no barriers to learning identified.    The following medications were prescribed: None    Disposition: Discharge. Follow up with PCP. ED discharge instructions were reviewed and provided.    APP DOCUMENTATION:  I have evaluated this patient, Dr. Amador Cunas was available for consultation.    PATIENT'S GENERAL CONDITION:  Good: Vital signs are stable and within normal limits. Patient is conscious and comfortable. Indicators are excellent.     Though I suspect no acutely life threatening condition, I did recommend patient return for any new or worse problems. PT was advised to follow up with PCP for further evaluation as soon as reasonable. Patient is aware their evaluation was not complete and primarily acute issues were evaluated and there is still a possibility of chronic issues requiring further evaluation.     Comment: Please note this record has been produced using speech recognition system and may contain errors related to that system including errors in grammar, punctuation, and spelling, as well as words and phrases that may be inappropriate. If there are any questions or concerns please feel free to contact the dictating provider for clarification.    Electronically Signed by: Sharion Dove, PA-C

## 2017-02-16 NOTE — Telephone Encounter (Addendum)
S/w patient, and well as her husband, he verified patients name, DOB, MRN and/or address at initiation of call.  When asked he reported they just arrived the [facility] ER for bil leg, left ankle edema and pain.   Requested when patient is released from ER to contact our office with an update. Husband verbalized understanding and agreed to contact our office tomorrow.     Update 02/17/17    [facility] ER: 02/16/17, ED Summary: Erica Hancock is a 16yr female with left lower leg edema. Work up in the ED consisted of examination, ultrasound.  Exam reveals no redness, minimal swelling, most swelling is noted to the ankle area.  Ultrasound of the left lower extremity shows no DVT. I discussed the results with the patient along with likely diagnosis. Patient was advised to follow with primary care in 2 days and return to the ED if any redness, worsening pain, fever, concern.  PLAN: 15yr female findings along with testing likely consistent with lower leg pain and swelling. Follow up and return precautions discussed with the patient as above. The patient participated in the decision making process, verbalized understanding, and no barriers to learning identified.  The following medications were prescribed: None  Disposition: Discharge. Follow up with PCP. ED discharge instructions were reviewed and provided.  PATIENT'S GENERAL CONDITION:Good: Vital signs are stable and within normal limits. Patient is conscious and comfortable. Indicators are excellent.   Though I suspect no acutely life threatening condition, I did recommend patient return for any new or worse problems. PT was advised to follow up with PCP for further evaluation as soon as reasonable. Patient is aware their evaluation was not complete and primarily acute issues were evaluated and there is still a possibility of chronic issues requiring further evaluation

## 2017-02-16 NOTE — ED Nursing Note (Signed)
Report to Melody, RN

## 2017-02-16 NOTE — ED Nursing Note (Signed)
US complete

## 2017-02-16 NOTE — ED Nursing Note (Signed)
PA Wilhelm at bedside.

## 2017-02-16 NOTE — ED Triage Note (Signed)
Back surgery 01/26/17, pain to L LE x 4 days.

## 2017-02-16 NOTE — Telephone Encounter (Signed)
S/w patient verified name, DOB, MRN and/or address at initiation of call.    Your provider:Kim    In your own words what is the chief concern you would like addressed by the doctor today: patient states leg and ankle are swollen and has pain in leg. Is thinking of going to ER.     What is a good contact number: [phone]    If you are not available, is it ok to leave a detailed message on your voicemail: Yes      Telephone calls are triaged; Urgent issues are triaged first then in the order received. Our goal is to contact every patient within 24 hours of the initial call; however we have up to 48 hours for routine calls and up to 72 hours for medication refills.  If YOU feel it's an EMERGENCY and you don't think you can wait for a response, then hang up and go to your nearest EMERGENCY room or dial 911.

## 2017-02-17 ENCOUNTER — Other Ambulatory Visit: Payer: Self-pay

## 2017-02-17 NOTE — Telephone Encounter (Signed)
Patient identified x 3; Lat Name: Beason -DOB:  1955-07-27- Address: [address] [address]    Has been using wedge pillows between legs, helping.   Walking 3 times a day around the house.   Encourage to walk every hour for a few minutes, encourage do leg exercises through out the day rather than just at the end of the day as she has been doing, use cane to prevent falls, keep hydrated.     She will call back if any other questions or new symptoms.   We discussed pertinent red flags and Azure  was advised to go to the nearest  ED if any of the following new symptoms present:  New bilateral leg/arm pain, or progressive deficit,  lower extremity sensorimotor changes,  bowel and bladder dysfunction,  saddle anesthesia,  or sudden lose of balance.  she voiced understanding.    Lincoln Maxin PA-C  Neurological Surgery Department.

## 2017-02-17 NOTE — Telephone Encounter (Signed)
Pt is calling back to speak with an advice nurse re: still having lots of pain in the leg and also to discuss the ultrasound pls call pt asap

## 2017-02-22 ENCOUNTER — Other Ambulatory Visit

## 2017-02-22 ENCOUNTER — Other Ambulatory Visit: Payer: Self-pay

## 2017-03-01 ENCOUNTER — Telehealth: Payer: Self-pay | Admitting: Family Medicine

## 2017-03-06 ENCOUNTER — Encounter: Payer: Self-pay | Admitting: Neurological Surgery

## 2017-03-06 ENCOUNTER — Ambulatory Visit: Attending: Neurological Surgery | Admitting: Neurological Surgery

## 2017-03-06 VITALS — BP 116/71 | HR 81 | Temp 98.0°F | Resp 12 | Ht 64.0 in | Wt 154.3 lb

## 2017-03-06 DIAGNOSIS — Z981 Arthrodesis status: Principal | ICD-10-CM | POA: Insufficient documentation

## 2017-03-06 DIAGNOSIS — Z9889 Other specified postprocedural states: Secondary | ICD-10-CM | POA: Insufficient documentation

## 2017-03-06 MED ORDER — BACLOFEN 5 MG TABLET
2.5000 mg | ORAL_TABLET | Freq: Three times a day (TID) | ORAL | 0 refills | Status: DC
Start: 2017-03-06 — End: 2017-09-08

## 2017-03-06 NOTE — Progress Notes (Signed)
SPINE CLINIC Follow-up/6 weeks post-op Check   Date of service1/08/2017    Erica Hancock is a 62yr -old  female seen in [facility], status post 01/26/17 L4-L5 laminectomies and fusion.  Diagnosis: L4-L5 degenerative spondylolisthesis and spinal stenosis with radiculopathy  MRI and lumbar x-rays: L4-L5 degenerative spondylolisthesis with abnormal motion and spinal stenosis at L4-L5 and L3-L4.    Hospital course: Patient was seen and evaluated by physical therapy who recommended she be discharged with front wheel walker.  After discharge home, patient developed left lower extremity swelling and was seen in the ED on 02/16/17 for evaluation of possible DVT.  Duplex study of the left leg showed no signs of thrombosis.    Patient returns to clinic today for her first post-operative check, with Improvement in her lumbar pain which is less than her preoperative level.  Pain is currently 4/10 and is occurring in both the back and posteriorly in the left calf and anteriorly in the left ankle, similar to her preoperative symptoms but to a lesser extent.  She describes symptoms of cramping and swelling in the left thigh.  She denies shortness of breath or chest pain, fever/chills or nausea and vomiting.  She denies bowel/bladder incontinence.      CURRENT MEDICATIONS:   Outpatient Medications Marked as Taking for the 03/06/17 encounter (Office Visit) with Terrall Laity, MD   Medication Sig Dispense Refill    Amitriptyline (ELAVIL) 50 mg Tablet Take 50 mg by mouth every day at bedtime.                 BECLOMETHASONE DIPROPIONATE (QVAR INHA) Take 1 puff by inhalation once daily if needed.                 Fluticasone (ALLER-FLO) 50 mcg/actuation nasal spray Spray 1-2 sprays into each nostril two times daily if needed.      lactase (LACTAID FAST ACT PO) Take 1 Caplet by mouth once daily if needed. With meal as needed                       Allergies   Allergen Reactions    Epinephrine Tachycardia     per Backus (Sulfonamide Antibiotics) Anaphylaxis    Suture N/A     Patient states skin "goes to mush"       PHYSICAL EXAMINATION:  Blood pressure 116/71, pulse 81, temperature 36.7 C (98 F), temperature source Temporal, resp. rate 12, height 1.626 m (5\' 4" ), weight 70 kg (154 lb 5.2 oz).        Musculoskeletal/Neurological:   GCS 15(E 4 V 5 M 6).   Gait: Steady    Incision: Incision is approximated with no erythema or drainage.    Neuro Exam:  PERL 35mm bilaterally.  Mental Status:  Answering appropriately in complete sentences.  Use of language: Fluent      Motor:   LOWER LIMB  Sensation intact throughout the bilateral lower limbs.  DTR 2+ at the bilateral patellae and Achilles.         Strength Movement Root Nerve Muscle   Bilateral     5/5   Hip flexion L1/2/3 Femoral/spinal Iliopsoas   Bilateral     5/5   Knee flexion L5, S1/2 Sciatic hamstrings   Bilateral     5/5   Knee extension L2/3/4 Femoral Quadriceps   Bilateral    5/5   Dorsiflexion L4/5 Deep peroneal Tibialis anterior  Bilateral    5/5   Big toe extension L5/S1 Deep peroneal EHL   Bilateral     5/5   Plantarflexion S1/S2 Tibial Gastrocnemius  soleus         RADIOLOGY:  01/29/17 lumbar films: Intact instrumentation at L4-L5.      IMPRESSION:   Patient was seen and evaluated by Dr. Charlann Noss who reviewed imaging, exam and symptoms with the patient. All questions were answered by Dr. Maudie Mercury.  Erica Hancock is a 62yr -old right HD female who is 6 weeks status post L4-L5 lumbar decompression and fusion with decrease in lower back and left leg pain.  Patient has continuing complaints of: Cramping in the left thigh.  Discussed the use of muscle relaxants with the patient and will reorder baclofen 5 mg tablets 3 times daily.  She does not want refill on narcotic pain medications.  Patient has stable neurological exam and will be monitored over the course of the next few months.  Discussed activity level with patient including continued use of lumbar brace  while out of bed and ambulating.  Discussed possible osteopenia, noted in quality of bone structure during surgery.    A total of 20 minutes was spent with patient. Greater than 50% of this time was spent in the consultation phase discussing issues as stated above and in EMR note.     RECOMMENDATIONS:  Discussion - The above was discussed with the patient at today's visit.  We discussed natural history of the disease process and all questions were answered    Plan:      o Medications / Procedures -baclofen 5 mg take 1/2-1 tablet p.o. 3 times daily #45  o Referrals - No new referrals are generated today  o Follow up - Patient will return for follow up in 2 months.  - We have recommended the following diagnostic studies to aide in evaluation: Lumbar films  Will order bone stimulator.    [place] NP-BC  Neurological Services  PI: [phone]

## 2017-03-06 NOTE — Nursing Note (Signed)
Identified patient using name and date of birth.  Vital signs were taken.  Screened for pain.  Allergies verified.  Pharmacy was updated.   Erica Hancock (Erica Hancock) Dionysios Massman, MA I

## 2017-03-06 NOTE — Progress Notes (Signed)
Spine Questionnaire Scores:  Review Flowsheet Displaying Spine Questionnaire Scores 03/06/2017   ODI Total Score 19   Erica Hancock ODI Percentage 38   Physical functioning score 16.5 (below average)   Role functioning score 16.5 (poor)   Social functioning score 40 (below average)   Mental health score 52 (below average)   Current health perceptions score 60 (average)   Pain score 40 (mild)

## 2017-03-09 NOTE — Progress Notes (Signed)
I personally saw the patient and reviewed all relevant history and pysical examination with Linda Jagels, NP.  I went over the imaging studies with the patient.  Based on the history including any treatment already tried and examination with relevant findings on the images, I have come with the recommended treatment plan.

## 2017-03-10 ENCOUNTER — Encounter: Payer: Self-pay | Admitting: Family Medicine

## 2017-03-16 NOTE — Progress Notes (Signed)
Chief Complaint   Patient presents with    Surgical Followup    Medication Follow Up    Depression       Subjective:  Erica Hancock is a 62yr old female who presents with the following chief complaint:     82yr female here for multiple concerns.    S/P lumbar fusion.  Reports surgery completed with subsequent hospitalization for 5 days.  Went 10 days without a bowel movement.  Tried multiple interventions including enemas and mag citrate.  Reports chronic problems with worsened hernia into vaginal canal and has been told to avoid constipation.  Feels like surgery is overall a success.  Did not want to take opioids as they really messed her up.  Now off of opioids and currently on Tylenol.  Taking 1/2 tablet Tramadol 50 mg only as needed.    Feels like the surgery changed her brain chemistry.  Significant problems with focus and concentration - inability to complete.  Feels small improvements each day.  Currently on Amitriptyline 50 mg to help with sleep.  Stable overall.  Now that she is off opioids, she gets up at 3:30 am.  States she is going through REM.  Reports short temper.   Requesting to see a psychiatrist due to expertise in her situation.  Declines trial of medication at this time until seen by psychiatrist.  Reports emotions are out of order.  No current suicidal or homicidal ideation.  Significant depression and anxiety in the past with son who is a drug addict.  Previously tried Prozac.    Last seen by Dr. Jacky Kindle, neurosurgery on 03/06/17.  Currently on CBD with low amount of THC.      Concern about osteopenia.  Planned minimal surgery but became more invasive.  Planned use of bone stimulator.  Concerned about preventative options.  No recommendation for physical therapy yet.  Interested in swimming class in the future.  Wants to get more active again.        Social History     Tobacco Use    Smoking status: Former Smoker     Packs/day: 1.00     Years: 12.00     Pack years: 12.00     Types:  Cigarettes     Last attempt to quit: 03/01/1979     Years since quitting: 38.0    Smokeless tobacco: Never Used   Substance Use Topics    Alcohol use: Yes     Alcohol/week: 3.6 oz     Types: 6 Glasses of wine per week     Comment: 1/day    Drug use: Yes     Frequency: 1.0 times per week     Types: Marijuana     Comment: once daily oil       Patient Active Problem List   Diagnosis    Migraines    Insomnia    Arthritis of carpometacarpal (CMC) joint of right thumb    Asthma    HLD (hyperlipidemia)    Spondylolisthesis of lumbar region    Foraminal stenosis of lumbar region    Lumbar radicular pain    Calcific tendinitis of left shoulder    Pain in joint of left shoulder    Basal cell carcinoma of skin of nose    Basal cell carcinoma    Osteopenia    Recurrent major depressive disorder, in partial remission (HCC)    Mixed hypercholesterolemia and hypertriglyceridemia    Tear of left supraspinatus tendon  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                 baclofen 5 mg Tablet Take 0.5-1 tablets by mouth 3 times daily. 45 tablet 0    BECLOMETHASONE DIPROPIONATE (QVAR INHA) Take 1 puff by inhalation once daily if needed.                 ESTRACE 0.01 % (0.1 mg/gram) Vaginal Cream Insert into the vagina three times a week. (Patient taking differently: Insert 1 g into the vagina As Directed. Insert into the vagina three times a week.          ) 42.5 g 3    Fluticasone (ALLER-FLO) 50 mcg/actuation nasal spray Spray 1-2 sprays into each nostril two times daily if needed.      lactase (LACTAID FAST ACT PO) Take 1 Caplet by mouth once daily if needed. With meal as needed                Sennosides (SENOKOT) 8.6 mg Tablet  Take 2 tablets by mouth every day at bedtime. (Patient taking differently: Take 2 tablets by mouth every day at bedtime.           ) 60 tablet 0     No current facility-administered medications on file prior to visit.      BP 119/70 (SITE: right arm, Orthostatic Position: sitting, Cuff Size: regular)   Pulse 87   Temp 37 C (98.6 F) (Temporal)   Wt 71.6 kg (157 lb 13.6 oz)   BMI 27.09 kg/m     OBJECTIVE:  General Appearance: healthy, alert, no distress, pleasant affect, cooperative.  Musculoskeletal: Wearing lumbar spine brace.      ASSESSMENT AND PLAN AS DISCUSSED WITH THE PATIENT:      ICD-10-CM    1. S/P lumbar fusion Z98.1 [place]   2. Postoperative ileus (HCC) K91.89     K56.7    3. Reactive depression F32.9 [place]   4. Chronic insomnia F51.04 [facility]   5. Osteopenia, unspecified location M85.80      Reviewed and discussed patient's medical concerns at length.    3yr female s/p lumbar fusion with subsequent hospitalization and associated postoperative ileus.  Development of reactive depression with long-standing chronic insomnia.  Currently on Amitriptyline daily.  Referral to psychiatrist for further evaluation / management at patient's request.  Recommended counseling.  Discussed stress relief, anxiety management, exercise, healthy diet and exercise.  Patient will advance exercise and weightbearing exercises as recommended by neurosurgery.  I will send message to Dr. Maudie Mercury as well to discuss time course further.  Patient understands that goal of physical therapy will be for weightbearing to help with osteopenia but cannot be done until suitable healing from surgery has taken place.  Recommended Calcium and Vitamin D as previously discussed.  Discussed care and warning signs with Erica Hancock and all questions and concerns were fully answered. She will call or followup in the office if any problems arise.     I have reviewed the  patient's medical history in detail and updated  the computerized patient record.    Barriers to Learning assessed: none.   Patient verbalizes understanding of teaching and instructions.    The patient and/or caregiver was educated regarding his/her medical care.   No guarantees were made regarding his/her medical care or treatment outcome.    I reviewed past medical, family/social and medication history during the visit.     This note was created using the support of [employer] and/or Pharmacist, community. Please note any grammatical, sound-alike, or syntax errors as likely dictation errors.      Electronically signed by:    Otis Peak, DO  Board-Certified, American Board of Family Medicine  Associate Physician in [place]

## 2017-03-17 ENCOUNTER — Ambulatory Visit: Admitting: Family Medicine

## 2017-03-17 ENCOUNTER — Encounter: Payer: Self-pay | Admitting: Family Medicine

## 2017-03-17 VITALS — BP 119/70 | HR 87 | Temp 98.6°F | Wt 157.8 lb

## 2017-03-17 DIAGNOSIS — K567 Ileus, unspecified: Secondary | ICD-10-CM

## 2017-03-17 DIAGNOSIS — M8580 Other specified disorders of bone density and structure, unspecified site: Secondary | ICD-10-CM

## 2017-03-17 DIAGNOSIS — K9189 Other postprocedural complications and disorders of digestive system: Secondary | ICD-10-CM

## 2017-03-17 DIAGNOSIS — F5104 Psychophysiologic insomnia: Secondary | ICD-10-CM

## 2017-03-17 DIAGNOSIS — Z981 Arthrodesis status: Principal | ICD-10-CM

## 2017-03-17 DIAGNOSIS — F329 Major depressive disorder, single episode, unspecified: Secondary | ICD-10-CM

## 2017-03-17 NOTE — Nursing Note (Signed)
Vital signs taken, allergies verified, screened for pain, tobacco hx verified, pharmacy verified.  Haddy Mullinax MA

## 2017-03-20 DIAGNOSIS — F329 Major depressive disorder, single episode, unspecified: Secondary | ICD-10-CM | POA: Insufficient documentation

## 2017-03-20 DIAGNOSIS — Z981 Arthrodesis status: Secondary | ICD-10-CM | POA: Insufficient documentation

## 2017-03-26 ENCOUNTER — Telehealth: Payer: Self-pay | Admitting: Family Medicine

## 2017-03-26 NOTE — Telephone Encounter (Signed)
Please inform patient.  I spoke with Dr. Maudie Mercury regarding physical therapy.  He stated that he typically waits 6 months until initiating physical therapy after surgery but will have a more precise answer after reviewing x-rays with patient at next appointment.  In the mean, patient may have physical therapy that is not spine-related i.e. Working on arms and legs.  Please let me know if she is interested now or wants to wait until seen by Dr. Maudie Mercury.

## 2017-03-27 NOTE — Telephone Encounter (Signed)
Left message on unidentifed voicemail for patient to call clinic back and ask to speak to the advise nurse to get the message from their doctor.    ATTN STAFF:  When patient calls back please transfer patient to advise nurse

## 2017-03-27 NOTE — Telephone Encounter (Signed)
3 patient identifiers used.    Relayed MD Krouse message to patient. Patient verbalized understanding states will discuss with MD Maudie Mercury at her next appointment has questions about a bone stimulator advised to call MD Maudie Mercury.      Charlotte Sanes, RN  PCN Triage

## 2017-04-03 ENCOUNTER — Ambulatory Visit
Admission: RE | Admit: 2017-04-03 | Discharge: 2017-04-03 | Disposition: A | Discharge status: Home / Self Care | Source: Ambulatory Visit

## 2017-04-03 ENCOUNTER — Ambulatory Visit (INDEPENDENT_AMBULATORY_CARE_PROVIDER_SITE_OTHER)

## 2017-04-03 VITALS — BP 124/80 | HR 97 | Temp 97.6°F | Ht 63.0 in | Wt 157.2 lb

## 2017-04-03 DIAGNOSIS — R109 Unspecified abdominal pain: Secondary | ICD-10-CM

## 2017-04-03 DIAGNOSIS — R1011 Right upper quadrant pain: Principal | ICD-10-CM

## 2017-04-03 LAB — POC URINALYSIS DIPSTICK AUTOMATED W/O MICROSCOPY
POC BILIRUBIN URINE: NEGATIVE
POC GLUCOSE URINE: NEGATIVE mg/dL
POC KETONES: NEGATIVE
POC LEUK. ESTERASE: NEGATIVE
POC NITRITE URINE: NEGATIVE
POC OCCULT BLOOD URINE: NEGATIVE
POC PH URINALYSIS: 5.5 pH (ref 4.8–7.8)
POC PROTEIN URINE: NEGATIVE
POC SP GRAVITY: 1.02 (ref 1.002–1.030)
POC UROBILINOGEN: 0.2 EU/dL

## 2017-04-03 NOTE — Nursing Note (Signed)
Vitals signs taken, screened for pain, verified allergies and verified pharmacy.  Quinto Tippy Carreau MA II

## 2017-04-03 NOTE — Progress Notes (Signed)
Chief Complaint   Patient presents with    Other     URQ pain/lungs pain wakes patient up at night       Subjective: Erica Hancock is a(n) 62yr old female who presents for a new problem of RUQ abdominal pain for the past 2 week(s).  The pain is intermittent and lasts minutes to hours  , and is described as sharp, stabbing and cramping with radiation to R back.  She has associated nausea and nonproductive cough , but denies vomiting, diarrhea, constipation, melena, hematochezia, dysuria, frequency, hematuria, fever, chills and sweats.  Pain is exacerbated by nothing  and is relieved by nothing .  Previous abdominal surgeries are vaginal hysterectomy .  Risk factors include prior history of nephrolithiasis and past history biliary colic .    ROS:  Patient denies any exertional chest pain, dyspnea, palpitations, syncope, orthopnea, edema or paroxysmal nocturnal dyspnea.     History:  I did review patient's past medical and family/social history, no changes noted.    Objective:  BP 124/80 (SITE: left arm, Orthostatic Position: sitting, Cuff Size: large)   Pulse 97   Temp 36.4 C (97.6 F) (Skin)   Ht 1.6 m (5\' 3" )   Wt 71.3 kg (157 lb 3 oz)   BMI 27.84 kg/m    General Appearance: healthy, alert, no distress, pleasant affect, cooperative.  Neck:  Neck supple. No adenopathy, thyroid symmetric, normal size.  Heart:  normal rate and regular rhythm, no murmurs, clicks, or gallops.  Lungs: clear to auscultation.  Abdomen: BS normal.  Abdomen soft, non-tender.  No masses or organomegaly.  Hydration: well hydrated.  POC UA dip normal   A Chest X-Ray was ordered. My reading of this film is normal . (No comparison films available: pending review by Radiologist.)   Assessment: (R10.11) RUQ abdominal pain  (primary encounter diagnosis)  Comment: rule out GBD  Plan: CBC NO DIFFERENTIAL, COMPREHENSIVE METABOLIC         PANEL, AMYLASE, LIPASE, US ABDOMEN, COMPLETE        If fever intractable vomiting to ED if [place] + GI surgery  eval    (R10.9) Flank pain  Comment: from RUQ   Plan: DX CHEST 2 VIEWS, POC URINALYSIS DIPSTICK         AUTOMATED W/O MICROSCOPY, CBC NO DIFFERENTIAL,         COMPREHENSIVE METABOLIC PANEL, AMYLASE, LIPASE,        US ABDOMEN, COMPLETE        See above      See Diagnoses Section.      Plan:  See above   See Orders.    Patient Instruction:  See Patient Education and Orders Sections.      Marliss Czar, MD      Barriers to Learning assessed: none. Patient verbalizes understanding of teaching and instructions.

## 2017-04-10 ENCOUNTER — Telehealth: Payer: Self-pay | Admitting: Family Medicine

## 2017-04-10 DIAGNOSIS — Z1239 Encounter for other screening for malignant neoplasm of breast: Principal | ICD-10-CM

## 2017-04-10 NOTE — Telephone Encounter (Signed)
3 identifiers were used - patient phoned back, informed below. Appointment made.

## 2017-04-10 NOTE — Telephone Encounter (Signed)
Patient will like to get a mammogram please place an order. phone number to call when order place is [phone]. Thank you. Alyssa Grove MA

## 2017-04-10 NOTE — Telephone Encounter (Signed)
Mammogram ordered.  Please advise.  Thank you.

## 2017-04-11 ENCOUNTER — Ambulatory Visit: Admission: RE | Admit: 2017-04-11 | Source: Ambulatory Visit

## 2017-04-18 ENCOUNTER — Encounter: Payer: Self-pay | Admitting: Family Medicine

## 2017-05-08 ENCOUNTER — Other Ambulatory Visit: Payer: Self-pay

## 2017-05-09 ENCOUNTER — Telehealth: Payer: Self-pay | Admitting: Family Medicine

## 2017-05-09 MED ORDER — AMITRIPTYLINE 50 MG TABLET
50.0000 mg | ORAL_TABLET | Freq: Every day | ORAL | 1 refills | Status: DC
Start: 2017-05-09 — End: 2017-07-18

## 2017-05-09 NOTE — Telephone Encounter (Signed)
Patient last seen 04/03/17

## 2017-05-09 NOTE — Telephone Encounter (Signed)
Controlled Substance:    Requesting a refill of Amitriptyline (ELAVIL) 50 mg Tablet   .  She states that the dose is 50mg  and the instructions are: Take 50mg  every day at bedtime.  The patient requests preferred mail order pharmacy.          [[facility]:  Please inform that all medication requests should be made 3 business days in advance.  Advise patient once requested to allow for 48-72 business hours for processing.  Verify that the prescription was not already called in.  Route to MA pool.  MA:  Sort medication history list by date, select the last prescribed version of the medication. Pend refill and route to Provider.  If message is URGENT, MA is to notify Provider face to face about the request].

## 2017-05-09 NOTE — Telephone Encounter (Signed)
Refill sent to mail order pharmacy.  Please advise.

## 2017-05-10 ENCOUNTER — Ambulatory Visit: Admission: RE | Admit: 2017-05-10 | Source: Ambulatory Visit

## 2017-05-10 ENCOUNTER — Other Ambulatory Visit: Payer: Self-pay | Admitting: NURSE PRACTITIONER

## 2017-05-10 DIAGNOSIS — Z981 Arthrodesis status: Principal | ICD-10-CM

## 2017-05-12 ENCOUNTER — Encounter: Payer: Self-pay | Admitting: Family Medicine

## 2017-05-12 ENCOUNTER — Telehealth: Payer: Self-pay | Admitting: Family Medicine

## 2017-05-12 ENCOUNTER — Ambulatory Visit (INDEPENDENT_AMBULATORY_CARE_PROVIDER_SITE_OTHER): Admitting: Family Medicine

## 2017-05-12 ENCOUNTER — Ambulatory Visit
Admission: RE | Admit: 2017-05-12 | Discharge: 2017-05-12 | Disposition: A | Discharge status: Home / Self Care | Source: Ambulatory Visit | Attending: Family Medicine | Admitting: Family Medicine

## 2017-05-12 VITALS — BP 119/70 | HR 80 | Temp 98.0°F | Wt 156.7 lb

## 2017-05-12 DIAGNOSIS — L710 Perioral dermatitis: Principal | ICD-10-CM

## 2017-05-12 DIAGNOSIS — F5104 Psychophysiologic insomnia: Secondary | ICD-10-CM

## 2017-05-12 DIAGNOSIS — L738 Other specified follicular disorders: Secondary | ICD-10-CM

## 2017-05-12 DIAGNOSIS — F329 Major depressive disorder, single episode, unspecified: Secondary | ICD-10-CM

## 2017-05-12 DIAGNOSIS — Z803 Family history of malignant neoplasm of breast: Secondary | ICD-10-CM

## 2017-05-12 DIAGNOSIS — Z981 Arthrodesis status: Secondary | ICD-10-CM

## 2017-05-12 DIAGNOSIS — Z1239 Encounter for other screening for malignant neoplasm of breast: Principal | ICD-10-CM

## 2017-05-12 DIAGNOSIS — Z1231 Encounter for screening mammogram for malignant neoplasm of breast: Secondary | ICD-10-CM

## 2017-05-12 MED ORDER — FLUCONAZOLE 150 MG TABLET
150.0000 mg | ORAL_TABLET | ORAL | 0 refills | Status: AC
Start: 2017-05-12 — End: 2017-06-03

## 2017-05-12 MED ORDER — DOXYCYCLINE HYCLATE 100 MG CAPSULE
100.0000 mg | ORAL_CAPSULE | Freq: Two times a day (BID) | ORAL | 0 refills | Status: AC
Start: 2017-05-12 — End: 2017-05-22

## 2017-05-12 NOTE — Progress Notes (Signed)
Chief Complaint   Patient presents with    Rash     on face x45 days itchy sometimes        Subjective:  Erica Hancock is a 62yr old female who presents with the following chief complaint:     30yr female here for follow-up facial rash.    Perioral dermatitis with follicullitis.  Last time using razor on face for peach fuzz was 6 weeks ago. Recommended to use it by estetician.  Developed significant irritation.  Not sure if orthodontist may be spreading bacteria into face.  Develops redness with pimples.  KOH negative for hyphae.  Seen by Erica Bos, NP.  Trial of anti-fungal cream without resolution.  Rx Doxycycline with slow resolution.  Exposure to mold at home.  Reports right pleural pain - normal chest x-ray.      S/P lumbar fusion.  Feels like the back pain is going well.  Using CBD oil instead of opioid medications.  Upcoming appointment with 05/15/17 with Dr. Maudie Hancock, neurosurgeon.  Wants to get the go ahead for starting physical therapy.    Reactive depression and chronic insomnia.  Stable on Amitriptyline 50 mg daily.        Social History     Tobacco Use    Smoking status: Former Smoker     Packs/day: 1.00     Years: 12.00     Pack years: 12.00     Types: Cigarettes     Last attempt to quit: 03/01/1979     Years since quitting: 38.2    Smokeless tobacco: Never Used   Substance Use Topics    Alcohol use: Yes     Alcohol/week: 3.6 oz     Types: 6 Glasses of wine per week     Comment: 1/day    Drug use: Yes     Frequency: 1.0 times per week     Types: Marijuana     Comment: once daily oil       Patient Active Problem List   Diagnosis    Migraines    Insomnia    Arthritis of carpometacarpal (CMC) joint of right thumb    Asthma    HLD (hyperlipidemia)    Spondylolisthesis of lumbar region    Foraminal stenosis of lumbar region    Lumbar radicular pain    Calcific tendinitis of left shoulder    Pain in joint of left shoulder    Basal cell carcinoma of skin of nose    Basal cell carcinoma     Osteopenia    Recurrent major depressive disorder, in partial remission ([place])    Mixed hypercholesterolemia and hypertriglyceridemia    Tear of left supraspinatus tendon    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 Indications: depression 90 tablet 1    baclofen 5 mg Tablet Take 0.5-1 tablets by mouth 3 times daily. 45 tablet 0    BECLOMETHASONE DIPROPIONATE (QVAR INHA) Take 1 puff by inhalation once daily if needed.  ESTRACE 0.01 % (0.1 mg/gram) Vaginal Cream Insert into the vagina three times a week. (Patient taking differently: Insert 1 g into the vagina As Directed. Insert into the vagina three times a week.          ) 42.5 g 3    Fluticasone (ALLER-FLO) 50 mcg/actuation nasal spray Spray 1-2 sprays into each nostril two times daily if needed.      lactase (LACTAID FAST ACT PO) Take 1 Caplet by mouth once daily if needed. With meal as needed                Sennosides (SENOKOT) 8.6 mg Tablet Take 2 tablets by mouth every day at bedtime. (Patient taking differently: Take 2 tablets by mouth every day at bedtime.           ) 60 tablet 0     No current facility-administered medications on file prior to visit.      BP 119/70 (SITE: right arm, Orthostatic Position: sitting, Cuff Size: regular)   Pulse 80   Temp 36.7 C (98 F) (Temporal)   Wt 71.1 kg (156 lb 12 oz)   BMI 27.77 kg/m     OBJECTIVE:  General Appearance: healthy, alert, no distress, pleasant affect, cooperative.  Mouth: normal.  Skin:  Skin color, texture, turgor normal. Multiple erythematous papules and pustules surrounding lips on both sides.    ASSESSMENT AND PLAN AS DISCUSSED WITH THE PATIENT:       ICD-10-CM    1. Perioral dermatitis L71.0 Fluconazole (DIFLUCAN) 150 mg Tablet     Doxycycline 100 mg Capsule   2. Bacterial folliculitis T41.9    3. S/P lumbar fusion Z98.1    4. Reactive depression F32.9    5. Chronic insomnia F51.04      Reviewed and discussed patient's medical concerns at length.    1yr female with recurrent perioral dermatitis due to suspected bacterial folliculitis.    Discussed diagnosis, course, treatment, management.  Discussed pertinent anatomy and physiology at length with patient.   Discussed supportive care and trigger avoidance. Rx Doxycycline given.  Warned.  Rx Diflucan in case of development of vaginal candidiasis.  Follow-up if not improved / worsened.  Consider dermatology consultation.    S/P lumbar fusion.  Stable.  Improvement of symptoms.  Follow-up with neurosurgery as scheduled and start physical therapy as scheduled.    Reactive depression and chronic insomnia.  Stable on Amitriptyline.        Discussed care and warning signs with Erica Hancock and all questions and concerns were fully answered. She will call or followup in the office if any problems arise.     I have reviewed the patient's medical history in detail and updated the computerized patient record.    Barriers to Learning assessed: none.   Patient verbalizes understanding of teaching and instructions.    The patient and/or caregiver was educated regarding his/her medical care.   No guarantees were made regarding his/her medical care or treatment outcome.    I reviewed past medical, family/social and medication history during the visit.     This note was created using the support of [facility] [employer] and/or Pharmacist, community. Please note any grammatical, sound-alike, or syntax errors as likely dictation errors.      Electronically signed by:    Otis Peak, DO  Board-Certified, American Board of Family Medicine  Associate Physician in [place]

## 2017-05-12 NOTE — Telephone Encounter (Signed)
3 patient identifiers used.  Per:   patient.      ClearTriage Note: Patient states she had rash around the corner of her mouth and upper lip in November last year. Patient states 7 weeks ago it started again and is red skin that is bumpy and dry. Patient states it changes throughout the day and denies any other symptoms. Patient requesting appt.    Protocol Used: Rash or Redness - Localized (Adult)  Protocol-Based Disposition: See PCP When Office is Open (within 3 days)    Positive Triage Question:  * Localized rash present > 7 days    Negative Triage Questions:  * [1] Sudden onset of rash (within last 2 hours) AND [2] difficulty with breathing or swallowing  * Sounds like a life-threatening emergency to the triager  * [1] Localized purple or blood-colored spots or dots AND [2] not from injury or friction AND [3] fever  * Patient sounds very sick or weak to the triager  * [1] Red area or streak AND [2] fever  * [1] Rash is painful to touch AND [2] fever  * [1] Looks infected (spreading redness, pus) AND [2] large red area (> 2 in. or 5 cm)  * [1] Looks infected (spreading redness, pus) AND [2] diabetes mellitus or weak immune system (e.g., HIV positive,  cancer chemotherapy, chronic steroid treatment, splenectomy)  * [1] Localized purple or blood-colored spots or dots AND [2] not from injury or friction AND [3] no fever  * [1] Looks infected (spreading redness, pus) AND [2] no fever  * Looks like a boil, infected sore, deep ulcer or other infected rash  * [1] Localized rash is very painful AND [2] no fever  * Genital area rash  * Lyme disease suspected (e.g., bull's eye rash or tick bite / exposure)  * [1] Applying cream or ointment AND [2] causes severe itch, burning or pain  * [1] Pimples (localized) AND [place].Ok ] no improvement after using CARE ADVICE  * Tender bumps in armpits  * [1] Severe localized itching AND [2] after 2 days of steroid cream    Disposition: Appointment given per protocol  Per:   patient  verbalizes agreement to plan. Agrees to callback with any increase in symptoms/concerns or questions.     Hilary Hertz, RN  PCN Triage

## 2017-05-12 NOTE — Nursing Note (Signed)
Patient vitals checked, allergies verified, pharmacy verified and patient screened for pain.  By Kohner Orlick, MA

## 2017-05-15 ENCOUNTER — Encounter: Payer: Self-pay | Admitting: Neurological Surgery

## 2017-05-15 ENCOUNTER — Ambulatory Visit (HOSPITAL_BASED_OUTPATIENT_CLINIC_OR_DEPARTMENT_OTHER): Admitting: Neurological Surgery

## 2017-05-15 ENCOUNTER — Ambulatory Visit
Admission: RE | Admit: 2017-05-15 | Discharge: 2017-05-15 | Disposition: A | Discharge status: Home / Self Care | Source: Ambulatory Visit | Attending: Neurological Surgery | Admitting: Neurological Surgery

## 2017-05-15 VITALS — BP 129/78 | HR 76 | Temp 98.4°F | Resp 12 | Ht 64.0 in | Wt 156.5 lb

## 2017-05-15 DIAGNOSIS — Z981 Arthrodesis status: Principal | ICD-10-CM | POA: Insufficient documentation

## 2017-05-15 NOTE — Nursing Note (Signed)
Identified patient using name and date of birth.  Vital signs were taken.  Screened for pain.  Allergies verified.  Pharmacy was updated.   Priti Consoli (Tina) Jacob Cicero, MA I

## 2017-05-15 NOTE — Progress Notes (Signed)
NEUROLOGICAL SPINE- SURGERY   [address] [place], [place]    [facility] Follow-up Note    Date of visit: 05/15/2017    Chief Complaint   Patient presents with    Post-op      Procedure Performed/Description:  01/26/2017  1. L4 laminectomy, bilateral L3-4 and L4-5 foraminotomies  2. L4 to L5 arthrodesis/instrumentation using pedicle screws x 4, rods x 2 with in-situ autograft and InFuse (small)      HISTORY OF PRESENT ILLNESS:   Erica Hancock is a 62yr old female 3 months s/p  Above procedure, feeling really well. Walking on a daily basis, wants to go back to the Gym. He is working and yesterday was the first day that didi some yard work and did well.    Denies any Bowel or Bladder dysfunction   Pain level: Lumbar  SPINE 0/10   Assistive devices: N/A   Medications: Currently not  Taking any  with good pain control.  Function:  able to do AODL's.    REVIEW OF SYSTEMS:  A complete review of systems is performed and is positive for:see above   The remainder of the review of systems are otherwise negative except as above.    CURRENT MEDICATIONS:   No outpatient medications have been marked as taking for the 05/15/17 encounter (Office Visit) with Terrall Laity, MD.      Allergies   Allergen Reactions    Epinephrine Tachycardia     per [facility] (Sulfonamide Antibiotics) Anaphylaxis    Suture N/A     Patient states skin "goes to mush"       PHYSICAL EXAMINATION:  BP 129/78 (SITE: left arm, Orthostatic Position: sitting, Cuff Size: regular)   Pulse 76   Temp 36.9 C (98.4 F) (Temporal)   Resp 12   Ht 1.626 m (5\' 4" )   Wt 71 kg (156 lb 8.4 oz)   BMI 26.87 kg/m   General: Pleasant, alert, well developed, NAD,oriented x 3.    Musculoskeletal/Neurological:   CN II-XII grossly intact.  Wound:  no erythema, drainage or dehiscence. Healing well.   Range of Motion: Lumbar  Spine  limited range of motion in all planes, as expected due to recent procedure.      MOTOR:      Deltoid  Biceps  Triceps  Wrist Flexion  Wrist Extension  Hand Intrinsics    Right  5/5 5/5 5/5 5/5 5/5 5/5   Left  5/5 5/5 5/5 5/5 5/5 5/5      Hip Flexion  Knee Extension  Knee Flexion  Dorsiflexion  Great Toe Extension  Plantar Flexion    Right  5/5 5/5 5/5 5/5 5/5 5/5   Left  5/5 5/5 5/5 5/5 5/5 5/5         RADIOLOGY:    New Lumbar  Spine x rays done today 05/15/2017  Good alignment of the spine, no hardware failure evident.  Dr Maudie Mercury interpreted the images independently.      IMPRESSION:   Patient was seen and evaluated by Dr. Maudie Mercury who reviewed imaging, exam and symptoms with the patient.  Erica Hancock is a 62yr old female  3 months  post Lumbar fusion L 4-5 doing really well, resolved  Leg pain  Numbness and tingling .    A total of 30 minutes were spent with patient. Greater than 50% of this time was spent in the consultation phase discussing  issues as stated above and in EMR note.     RECOMMENDATIONS:   Avoid bending, twisting, climbing, stooping and Lifting > 15 pounds    Ok to do gentle water aerobics, avoid skiing or bowling.  Avoid NSAID's for another 2 months     Discussion :   The above was discussed with the patient at today's visit.  The diagnoses, workup, treatment options, risks, and benefits of each were explained in detail, and the patient expressed sound understanding.      Per that discussion, the following consensus plan was reached:  o Diagnostic imaging:  new Lumbar X rays ordered today.  o Referral: No new referrals:  o Follow Up: Patient to return to clinic in  6 months, with Lumbar  Spine X rays, 4 views.  Sooner if needed,    No orders of the defined types were placed in this encounter.      Lincoln Maxin PA-C  Neurological Surgery Department.     Attending: Charlann Noss  MD   Neurological Surgery Department.

## 2017-05-15 NOTE — Progress Notes (Signed)
Spine Questionnaire Scores:  Review Flowsheet Displaying Spine Questionnaire Scores 05/15/2017 03/06/2017   ODI Total Score 4 19   [place] ODI Percentage 8 38   Physical functioning score 44.16 (below average) 16.5 (below average)   Role functioning score 66.5 (below average) 16.5 (poor)   Social functioning score 100 (excellent) 40 (below average)   Mental health score 72 (average) 52 (below average)   Current health perceptions score 80 (average) 60 (average)   Pain score 20 (very mild) 40 (mild)

## 2017-05-16 ENCOUNTER — Ambulatory Visit: Payer: Self-pay | Admitting: Adult Health

## 2017-05-18 NOTE — Progress Notes (Signed)
I personally saw the patient and reviewed all relevant history and pysical examination with Ary Arauz PA-C.  I went over the imaging studies with the patient.  Based on the history including any treatment already tried and examination with relevant findings on the images, I have come with the recommended treatment plan.

## 2017-05-23 ENCOUNTER — Encounter: Payer: Self-pay | Admitting: Family Medicine

## 2017-06-06 ENCOUNTER — Ambulatory Visit

## 2017-07-11 ENCOUNTER — Ambulatory Visit

## 2017-07-18 ENCOUNTER — Other Ambulatory Visit: Payer: Self-pay | Admitting: Family Medicine

## 2017-07-18 MED ORDER — AMITRIPTYLINE 50 MG TABLET
50.0000 mg | ORAL_TABLET | Freq: Every day | ORAL | 1 refills | Status: DC
Start: 2017-07-18 — End: 2017-10-12

## 2017-07-18 NOTE — Telephone Encounter (Signed)
Patient last seen 05/12/17

## 2017-07-18 NOTE — Telephone Encounter (Signed)
General Requests:    Requesting a refill of Amitriptyline (ELAVIL).  The patient states that the dose is 50 mg Tablet and the instructions are Take 1 tablet by mouth every day at bedtime. Indications: depression - ORAL.  The patient requests 90 day supply.    [[facility]:  Please inform that all medication requests should be made 3 business days in advance.  Advise patient once requested to allow for 48-72 business hours for processing.  Verify that the prescription was not already called in.  Route to MA pool.  MA:  Sort medication history list by date, select the last prescribed version of the medication. Pend refill and route to Provider.  If message is URGENT, MA is to notify Provider face to face about the request].

## 2017-08-30 ENCOUNTER — Telehealth: Payer: Self-pay | Admitting: Family Medicine

## 2017-08-30 NOTE — Telephone Encounter (Signed)
Erica Hancock is a 62yr old female  3 patient identifiers used.  Per:   patient  Clear Triage Note: Sometimes the outside of her ankles are swollen.  Some days more than others.   Was wearing low heeled sandals last weekend and noticed the swelling.    Protocol Used: Ankle Swelling (Adult)  Protocol-Based Disposition: See PCP within 3 Days  Override (Final) Disposition: See PCP within 2 Weeks  Override Reason: Other  Override Notes: No appointments available.   Will call back for same day appointment.    Positive Triage Question:  * Swollen ankle joint  (Exception: area of localized swelling which is itchy)    Care Advice Discussed:  * Reasons To Call Back      - You develop a fever      - Ankle becomes red or very painful      - Ankle becomes very swollen      - You become worse.    Negative Triage Questions:  * Difficulty breathing  * Entire foot is cool or blue in comparison to other side  * Fever  * Patient sounds very sick or weak to the triager  * [1] SEVERE pain (e.g., excruciating, unable to walk) AND [2] not improved after 2 hours of pain medicine  * [1] Can't move swollen joint at all AND [2] no fever  * [1] Redness AND [2] painful when touched AND [3] no fever  * [1] Red area or streak [2] large (> 2 in. or 5 cm)  * [1] Thigh or calf pain AND [2] only 1 side AND [3] present > 1 hour  * [1] Thigh, calf, or ankle swelling AND [2] only 1 side  * [1] Thigh, calf, or ankle swelling AND [2] bilateral AND [3] 1 side is more swollen  * [1] Very swollen joint AND [2] no fever  * Looks like a boil, infected sore, deep ulcer or other infected rash (spreading redness, pus)  * [1] MODERATE pain (e.g., interferes with normal activities, limping) AND [2] present > 3 days  * [1] Area of swelling AND [2] itchy  Disposition: Appointment given per Clear Triage protocol  Appointment made for Friday 7/12 for evaluation  1130 with PCP  Per:   patient verbalizes agreement to plan. Agrees to callback with any increase in  symptoms/concerns or questions.    Clista Bernhardt, RN  PCN Triage  [phone]

## 2017-09-01 ENCOUNTER — Telehealth: Payer: Self-pay | Admitting: Family Medicine

## 2017-09-01 NOTE — Telephone Encounter (Signed)
Erica Hancock is a 62yr old female  3 patient identifiers used.  Per:   patient.      ClearTriage Note: Symptoms started two days ago with  cough, temp 100F, ear congestion, achy, fatigued. Patient is taking Tylenol and Robitussin with little relief. patient is drinking fluids. patient is up most of the night coughing. patient is having chest pain when coughing. Patient is speaking in clear sentences with frequent coughing. Denies sob, wheezing, sore throat, ear pain. Advised patient clinic is full and can be seen at [facility]    Protocol Used: Cough - Acute Productive (Adult)  Protocol-Based Disposition: See PCP within 24 Hours    Positive Triage Question:  * [1] Continuous (nonstop) coughing interferes with work or school AND [2] no improvement using cough treatment per Care Advice    Care Advice Discussed:  * Humidifier      - If the air is dry, use a humidifier in the bedroom. (Reason: dry air makes coughs worse)  * Coughing Spells      - Drink warm fluids. Inhale warm mist. (Reason: both relax the airway and loosen up the phlegm)      - Suck on cough drops or hard candy to coat the irritated throat.  * Reasons To Call Back      - Difficulty breathing occurs      - You become worse.    Negative Triage Questions:  * Severe difficulty breathing (e.g., struggling for each breath, speaks in single words)  * Bluish (or gray) lips or face now  * [1] Difficulty breathing AND [2] exposure to flames, smoke, or fumes  * [1] Stridor AND [2] difficulty breathing  * Sounds like a life-threatening emergency to the triager  * Chest pain  (Exception: MILD central chest pain, present only when coughing)  * Difficulty breathing  * Patient sounds very sick or weak to the triager  * [1] Coughed up blood AND [2] > 1 tablespoon (15 ml) (Exception: blood-tinged sputum)  * Fever > 103 F (39.4 C)  * [1] Fever > 101 F (38.3 C) AND [2] age > 96  * [1] Fever > 100.0 F (37.8 C) AND [2] bedridden (e.g., nursing home patient, CVA, chronic illness,  recovering from surgery)  * [1] Fever > 100.0 F (37.8 C) AND [2] diabetes mellitus or weak immune system (e.g., HIV positive, cancer chemo, splenectomy, organ transplant, chronic steroids)  * Wheezing is present  * SEVERE coughing spells (e.g., whooping sound after coughing, vomiting after coughing)    Disposition: Referred to [facility] given per  protocol  Per:   patient verbalizes agreement to plan. Agrees to callback with any increase in symptoms/concerns or questions.     Idelia Salm, RN   PCN Triage

## 2017-09-05 ENCOUNTER — Ambulatory Visit: Admitting: Internal Medicine

## 2017-09-05 ENCOUNTER — Encounter: Payer: Self-pay | Admitting: Internal Medicine

## 2017-09-05 VITALS — BP 126/76 | HR 77 | Temp 97.7°F | Resp 14 | Ht 63.0 in | Wt 151.5 lb

## 2017-09-05 DIAGNOSIS — F5104 Psychophysiologic insomnia: Secondary | ICD-10-CM

## 2017-09-05 DIAGNOSIS — J111 Influenza due to unidentified influenza virus with other respiratory manifestations: Principal | ICD-10-CM

## 2017-09-05 DIAGNOSIS — F419 Anxiety disorder, unspecified: Secondary | ICD-10-CM

## 2017-09-05 LAB — POC FLU TEST: POC FLU TEST: POSITIVE — AB

## 2017-09-05 MED ORDER — BENZONATATE 100 MG CAPSULE
100.0000 mg | ORAL_CAPSULE | Freq: Three times a day (TID) | ORAL | 0 refills | Status: AC | PRN
Start: 2017-09-05 — End: 2017-10-05

## 2017-09-05 NOTE — Nursing Note (Signed)
Patient roomed, Vitals obtained with pt's consent, checked for medication updates and refills.   Flo Berroa,  MA

## 2017-09-05 NOTE — Progress Notes (Signed)
Chief Complaint   Patient presents with    Cough    Cold       Subjective:  Erica Hancock is a 62yr old female who is here for the following reason:  6 days of fever, cough, achs and pains.   Non-productive cough.  Tmax 101 last week.  She has been taking Tylenol 1000 mg twice a day.   She states it started after flying home last week from [place].  Husband with similar symptoms 2 days ago.  Still pretty tired.   Slowly improving.      Taking elavil to help with her sleep at night, however states she has been having more anxiety lately.  Not sleeping as well.  She is busy with work.     Review of Systems -   Constitutional: decreased energy.  Ears, Nose, Mouth, Throat: sore throat first 2 nights, that has since resolved.  Negative for sinus pressure.  CV: negative.  Resp: some wheezing. She states she has asthma and her symptoms exacerbate when she gets a cold.   GI: negative for nausea/vomitting, abdominal pain, diarrhea.    Social History     Tobacco Use    Smoking status: Former Smoker     Packs/day: 1.00     Years: 12.00     Pack years: 12.00     Types: Cigarettes     Last attempt to quit: 03/01/1979     Years since quitting: 38.5    Smokeless tobacco: Never Used   Substance Use Topics    Alcohol use: Yes     Alcohol/week: 3.6 oz     Types: 6 Glasses of wine per week     Comment: 1/day    Drug use: Yes     Frequency: 1.0 times per week     Types: Marijuana     Comment: once daily oil       Patient Active Problem List   Diagnosis    Migraines    Insomnia    Arthritis of carpometacarpal (CMC) joint of right thumb    Asthma    HLD (hyperlipidemia)    Spondylolisthesis of lumbar region    Foraminal stenosis of lumbar region    Lumbar radicular pain    Calcific tendinitis of left shoulder    Pain in joint of left shoulder    Basal cell carcinoma of skin of nose    Basal cell carcinoma    Osteopenia    Recurrent major depressive disorder, in partial remission ([place])    Mixed hypercholesterolemia  and hypertriglyceridemia    Tear of left supraspinatus tendon    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 Indications: depression 90 tablet 1    baclofen 5 mg Tablet Take 0.5-1 tablets by mouth 3 times daily. 45 tablet 0    BECLOMETHASONE DIPROPIONATE (QVAR INHA) Take 1 puff by inhalation once daily if needed.                 ESTRACE 0.01 % (0.1 mg/gram) Vaginal Cream Insert into the vagina three times a  week. (Patient taking differently: Insert 1 g into the vagina As Directed. Insert into the vagina three times a week.          ) 42.5 g 3    Fluticasone (ALLER-FLO) 50 mcg/actuation nasal spray Spray 1-2 sprays into each nostril two times daily if needed.      lactase (LACTAID FAST ACT PO) Take 1 Caplet by mouth once daily if needed. With meal as needed                Sennosides (SENOKOT) 8.6 mg Tablet Take 2 tablets by mouth every day at bedtime. (Patient taking differently: Take 2 tablets by mouth every day at bedtime.           ) 60 tablet 0     No current facility-administered medications on file prior to visit.        OBJECTIVE:  BP 126/76 (SITE: left arm, Orthostatic Position: sitting, Cuff Size: large)   Pulse 77   Temp 36.5 C (97.7 F) (Temporal)   Resp 14   Ht 1.6 m (5\' 3" )   Wt 68.7 kg (151 lb 7.3 oz)   SpO2 99%   BMI 26.83 kg/m   General Appearance: healthy, alert, no distress, pleasant affect, cooperative.  Eyes:  conjunctivae and corneas clear. PERRL, EOM's intact. sclerae normal.  Ears:  normal TMs and canal and external inspection of ears show no abnormality.  Mouth: normal.  Neck:  No adenopathy, trachea midline and normal to palpitation.  Heart:  normal rate  and regular rhythm, no murmurs, clicks, or gallops.  Lungs: clear to auscultation and normal respiratory rate and rhythm.    GAD7: 14  PHQ9: 5    ASSESSMENT:  (J11.1) Influenza  (primary encounter diagnosis)  Comment: positive rapid influenza A.  Symptoms started 6 days ago.  We discussed the low efficacy this far out in the illness.  She opt's to take an anti-viral.  She will have her husband call her primary care physician to be treated for known exposure.   Plan: Benzonatate (TESSALON PERLES) 100 mg Capsule        Discussed rest, fluids, well balanced diet.   Follow-up with primary care physician already scheduled for this week.    (F41.9) Anxiety  Comment: stable, however needs to be addressed  Plan: she will discuss with her primary care physician this week.     (F51.04) Chronic insomnia  Comment: elavil does not seem to be effective for her anymore  Plan: discuss with primary care physician this week.    PLAN:  See Orders. Discussed with the patient and all questions fully answered. She will call me if any problems arise.     I did review patient's past medical and family/social history, no changes noted.  Barriers to Learning assessed: none. Patient verbalizes understanding of teaching and instructions.    Electronically signed by:  Cephus Shelling Jimmye Norman, D.O.  Associate Physician  [facility]

## 2017-09-05 NOTE — Patient Instructions (Addendum)
Influenza (Flu): Care Instructions  Your Care Instructions  Influenza (flu) is an infection in the lungs and breathing passages. It is caused by the influenza virus. There are different strains, or types, of the flu virus from year to year. Unlike the common cold, the flu comes on suddenly and the symptoms, such as a cough, congestion, fever, chills, fatigue, aches, and pains, are more severe. These symptoms may last up to 10 days. Although the flu can make you feel very sick, it usually doesn't cause serious health problems.  Home treatment is usually all you need for flu symptoms. But your doctor may prescribe antiviral medicine to prevent other health problems, such as pneumonia, from developing. Older people and those who have a long-term health condition, such as lung disease, are most at risk for having pneumonia or other health problems.  Follow-up care is a key part of your treatment and safety. Be sure to make and go to all appointments, and call your doctor if you are having problems. It's also a good idea to know your test results and keep a list of the medicines you take.  How can you care for yourself at home?   Get plenty of rest.   Drink plenty of fluids, enough so that your urine is light yellow or clear like water. If you have kidney, heart, or liver disease and have to limit fluids, talk with your doctor before you increase the amount of fluids you drink.   Take an over-the-counter pain medicine if needed, such as acetaminophen (Tylenol), ibuprofen (Advil, Motrin), or naproxen (Aleve), to relieve fever, headache, and muscle aches. Read and follow all instructions on the label. No one younger than 20 should take aspirin. It has been linked to Reye syndrome, a serious illness.   Do not smoke. Smoking can make the flu worse. If you need help quitting, talk to your doctor about stop-smoking programs and medicines. These can increase your chances of quitting for good.   Breathe moist air from a  hot shower or from a sink filled with hot water to help clear a stuffy nose.   Before you use cough and cold medicines, check the label. These medicines may not be safe for young children or for people with certain health problems.   If the skin around your nose and lips becomes sore, put some petroleum jelly on the area.   To ease coughing:   Drink fluids to soothe a scratchy throat.   Suck on cough drops or plain hard candy.   Take an over-the-counter cough medicine that contains dextromethorphan to help you get some sleep. Read and follow all instructions on the label.   Raise your head at night with an extra pillow. This may help you rest if coughing keeps you awake.   Take any prescribed medicine exactly as directed. Call your doctor if you think you are having a problem with your medicine.  To avoid spreading the flu   Wash your hands regularly, and keep your hands away from your face.   Stay home from school, work, and other public places until you are feeling better and your fever has been gone for at least 24 hours. The fever needs to have gone away on its own without the help of medicine.   Ask people living with you to talk to their doctors about preventing the flu. They may get antiviral medicine to keep from getting the flu from you.   To prevent the flu in the   future, get a flu vaccine every fall. Encourage people living with you to get the vaccine.   Cover your mouth when you cough or sneeze.  When should you call for help?  Call 911 anytime you think you may need emergency care. For example, call if:   You have severe trouble breathing.  Call your doctor now or seek immediate medical care if:   You have new or worse trouble breathing.   You seem to be getting much sicker.   You feel very sleepy or confused.   You have a new or higher fever.   You get a new rash.  Watch closely for changes in your health, and be sure to contact your doctor if:   You begin to get better and then get  worse.   You are not getting better after 1 week.   Where can you learn more?   Go to http://blackburn.com/  Enter [phone] in the search box to learn more about "Influenza (Flu): Care Instructions."    2006-2015 Health[employer]. Care instructions adapted under license by [facility]. This care instruction is for use with your licensed healthcare professional. If you have questions about a medical condition or this instruction, always ask your healthcare professional. [place] any warranty or liability for your use of this information.  Content Version: 10.6.465758; Current as of: November 06, 2012              Learning About Anxiety Disorders  What are anxiety disorders?  Anxiety disorders are a type of medical problem. They cause severe anxiety. When you feel anxious, you feel that something bad is about to happen. This feeling interferes with your life.  These disorders include:   Generalized anxiety disorder. You feel worried and stressed about many everyday events and activities. This goes on for several months and disrupts your life on most days.   Panic disorder. You have repeated panic attacks. A panic attack is a sudden, intense fear or anxiety. It may make you feel short of breath. Your heart may pound.   Social anxiety disorder. You feel very anxious about what you will say or do in front of people. For example, you may be scared to talk or eat in public. This problem affects your daily life.   Phobias. You are very scared of a specific object, situation, or activity. For example, you may fear spiders, high places, or small spaces.  What are the symptoms?  Generalized anxiety disorder  Symptoms may include:   Feeling worried and stressed about many things almost every day.   Feeling tired or irritable. You may have a hard time concentrating.   Having headaches or muscle aches.   Having a hard time swallowing.   Feeling shaky, sweating,  or having hot flashes.  Panic disorder  You may have repeated panic attacks when there is no reason for feeling afraid. You may change your daily activities because you worry that you will have another attack.  Symptoms may include:   Intense fear, terror, or anxiety.   Trouble breathing or very fast breathing.   Chest pain or tightness.   A heartbeat that races or is not regular.  Social anxiety disorder  Symptoms may include:   Fear about a social situation, such as eating in front of others or speaking in public. You may worry a lot. Or you may be afraid that something bad will happen.   Anxiety that can cause you to blush,  sweat, and feel shaky.   A heartbeat that is faster than normal.   A hard time focusing.  Phobias  Symptoms may include:   More fear than most people of being around an object, being in a situation, or doing an activity. You might also be stressed about the chance of being around the thing you fear.   Worry about losing control, panicking, fainting, or having physical symptoms like a faster heartbeat when you are around the situation or object.  How are these disorders treated?  Anxiety disorders can be treated with medicines or counseling. A combination of both may be used.  Medicines may include:   Antidepressants. These may help your symptoms by keeping chemicals in your brain in balance.   Benzodiazepines. These may give you short-term relief of your symptoms.  Some people use cognitive-behavioral therapy. A therapist helps you learn to change stressful or bad thoughts into helpful thoughts.  Lead a healthy lifestyle  A healthy lifestyle may help you feel better.   Get at least 30 minutes of exercise on most days of the week. Walking is a good choice.   Eat a healthy diet. Include fruits, vegetables, lean proteins, and whole grains in your diet each day.   Try to go to bed at the same time every night. Try for 8 hours of sleep a night.   Find ways to manage stress. Try  relaxation exercises.   Avoid alcohol and illegal drugs.  Follow-up care is a key part of your treatment and safety. Be sure to make and go to all appointments, and call your doctor if you are having problems. It's also a good idea to know your test results and keep a list of the medicines you take.   Where can you learn more?   Go to http://blackburn.com/  Enter [phone] in the search box to learn more about "Learning About Anxiety Disorders."    2006-2015 Healthwise, Inc[employer]. Care instructions adapted under license by [facility]. This care instruction is for use with your licensed healthcare professional. If you have questions about a medical condition or this instruction, always ask your healthcare professional. [place] any warranty or liability for your use of this information.  Content Version: 10.6.465758; Current as of: January 11, 2013              Insomnia: Care Instructions  Your Care Instructions  Insomnia is the inability to sleep well. It is a common problem for most people at some time. Insomnia may make it hard for you to get to sleep, stay asleep, or sleep as long as you need to. This can make you tired and grouchy during the day. It can also make you forgetful, less effective at work, and unhappy.  Insomnia can be caused by conditions such as depression or anxiety. Pain can also affect your ability to sleep. When these problems are solved, the insomnia usually clears up. But sometimes bad sleep habits can cause insomnia.  If insomnia is affecting your work or your enjoyment of life, you can take steps to improve your sleep.  Follow-up care is a key part of your treatment and safety. Be sure to make and go to all appointments, and call your doctor if you are having problems. It's also a good idea to know your test results and keep a list of the medicines you take.  How can you care for yourself at home?  What to avoid    Do not  have drinks  with caffeine, such as coffee or black tea, for 8 hours before bed.   Do not smoke or use other types of tobacco near bedtime. Nicotine is a stimulant and can keep you awake.   Avoid drinking alcohol late in the evening, because it can cause you to wake in the middle of the night.   Do not eat a big meal close to bedtime. If you are hungry, eat a light snack.   Do not drink a lot of water close to bedtime, because the need to urinate may wake you up during the night.   Do not read or watch TV in bed. Use the bed only for sleeping and sexual activity.  What to try    Go to bed at the same time every night, and wake up at the same time every morning. Do not take naps during the day.   Keep your bedroom quiet, dark, and cool.   Get regular exercise, but not within 3 to 4 hours of your bedtime.   Sleep on a comfortable pillow and mattress.   If watching the clock makes you anxious, turn it facing away from you so you cannot see the time.   If you worry when you lie down, start a worry book. Well before bedtime, write down your worries, and then set the book and your concerns aside.   Try meditation or other relaxation techniques before you go to bed.   If you cannot fall asleep, get up and go to another room until you feel sleepy. Do something relaxing. Repeat your bedtime routine before you go to bed again.   Make your house quiet and calm about an hour before bedtime. Turn down the lights, turn off the TV, log off the computer, and turn down the volume on music. This can help you relax after a busy day.  When should you call for help?  Watch closely for changes in your health, and be sure to contact your doctor if:   Your efforts to improve your sleep do not work.   Your insomnia gets worse.   You have been feeling down, depressed, or hopeless or have lost interest in things that you usually enjoy.   Where can you learn more?   Go to http://blackburn.com/  Enter P513 in the search box  to learn more about "Insomnia: Care Instructions."    2006-2015 Health[employer], Inc[employer]. Care instructions adapted under license by [facility]. This care instruction is for use with your licensed healthcare professional. If you have questions about a medical condition or this instruction, always ask your healthcare professional. [place] any warranty or liability for your use of this information.  Content Version: 10.6.465758; Current as of: January 11, 2013

## 2017-09-07 NOTE — Progress Notes (Deleted)
No chief complaint on file.      Subjective:  Erica Hancock is a 62yr old female who presents with the following chief complaint: ***      ***  Sometimes the outside of her ankles are swollen.  Some days more than others.   Was wearing low heeled sandals last weekend and noticed the swelling.    Social History     Tobacco Use    Smoking status: Former Smoker     Packs/day: 1.00     Years: 12.00     Pack years: 12.00     Types: Cigarettes     Last attempt to quit: 03/01/1979     Years since quitting: 38.5    Smokeless tobacco: Never Used   Substance Use Topics    Alcohol use: Yes     Alcohol/week: 3.6 oz     Types: 6 Glasses of wine per week     Comment: 1/day    Drug use: Yes     Frequency: 1.0 times per week     Types: Marijuana     Comment: once daily oil       Patient Active Problem List   Diagnosis    Migraines    Insomnia    Arthritis of carpometacarpal (CMC) joint of right thumb    Asthma    HLD (hyperlipidemia)    Spondylolisthesis of lumbar region    Foraminal stenosis of lumbar region    Lumbar radicular pain    Calcific tendinitis of left shoulder    Pain in joint of left shoulder    Basal cell carcinoma of skin of nose    Basal cell carcinoma    Osteopenia    Recurrent major depressive disorder, in partial remission ([place])    Mixed hypercholesterolemia and hypertriglyceridemia    Tear of left supraspinatus tendon    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 Indications: depression 90 tablet 1    baclofen 5 mg Tablet Take 0.5-1 tablets by mouth 3 times daily. 45 tablet 0    BECLOMETHASONE DIPROPIONATE (QVAR INHA) Take 1 puff by inhalation once daily if  needed.                 Benzonatate (TESSALON PERLES) 100 mg Capsule Take 1-2 capsules by mouth three times daily if needed. 50 capsule 0    ESTRACE 0.01 % (0.1 mg/gram) Vaginal Cream Insert into the vagina three times a week. (Patient taking differently: Insert 1 g into the vagina As Directed. Insert into the vagina three times a week.          ) 42.5 g 3    Fluticasone (ALLER-FLO) 50 mcg/actuation nasal spray Spray 1-2 sprays into each nostril two times daily if needed.      lactase (LACTAID FAST ACT PO) Take 1 Caplet by mouth once daily if needed. With meal as needed                Sennosides (SENOKOT) 8.6 mg Tablet Take 2 tablets by mouth every day at bedtime. (Patient taking differently:  Take 2 tablets by mouth every day at bedtime.           ) 60 tablet 0     No current facility-administered medications on file prior to visit.      There were no vitals taken for this visit.    OBJECTIVE:   General Physical Assessment:10547    ASSESSMENT AND PLAN AS DISCUSSED WITH THE PATIENT:      No diagnosis found.    ***    Health Maintenance reviewed -  :62.  Health Maintenance   Topic Date Due    Pneumococcal vaccine (1 of 1 - PPSV23) 12/13/1972    DTAP/TDAP/TD (1 - Tdap) 12/13/1972    CERVICAL HPV TEST (WITH NEXT PAP)  12/14/1983    PAP-GYN CYTOLOGY  12/14/1983    Shingrix (Zoster) (1 of 2) 12/14/2003    COLONOSCOPY  12/14/2003    INFLUENZA  09/28/2017    MAMMOGRAM  05/13/2019    HEPATITIS C SCREENING  Addressed    HIV SCREENING  Addressed         Discussed care and warning signs with Shereena and all questions and concerns were fully answered. She will call or followup in the office if any problems arise.     I have reviewed the patient's medical history in detail and updated the computerized patient record.    Barriers to Learning assessed: none.   Patient verbalizes understanding of teaching and instructions.    The patient and/or caregiver was educated regarding his/her medical care.   No guarantees  were made regarding his/her medical care or treatment outcome.    I reviewed past medical, family/social and medication history during the visit.     This note was created using the support of [employer] and/or Pharmacist, community. Please note any grammatical, sound-alike, or syntax errors as likely dictation errors.      Electronically signed by:    Otis Peak, DO  Board-Certified, American Board of Family Medicine  Associate Physician in [place], [place]  ***

## 2017-09-08 ENCOUNTER — Ambulatory Visit (INDEPENDENT_AMBULATORY_CARE_PROVIDER_SITE_OTHER)

## 2017-09-08 ENCOUNTER — Ambulatory Visit: Admitting: Family Medicine

## 2017-09-08 ENCOUNTER — Encounter: Payer: Self-pay | Admitting: Family Medicine

## 2017-09-08 VITALS — BP 105/69 | HR 83 | Temp 99.3°F | Resp 16 | Wt 151.9 lb

## 2017-09-08 DIAGNOSIS — R109 Unspecified abdominal pain: Principal | ICD-10-CM | POA: Insufficient documentation

## 2017-09-08 DIAGNOSIS — J4541 Moderate persistent asthma with (acute) exacerbation: Secondary | ICD-10-CM | POA: Insufficient documentation

## 2017-09-08 DIAGNOSIS — R600 Localized edema: Secondary | ICD-10-CM | POA: Insufficient documentation

## 2017-09-08 DIAGNOSIS — J111 Influenza due to unidentified influenza virus with other respiratory manifestations: Secondary | ICD-10-CM | POA: Insufficient documentation

## 2017-09-08 DIAGNOSIS — R7989 Other specified abnormal findings of blood chemistry: Principal | ICD-10-CM

## 2017-09-08 DIAGNOSIS — F5102 Adjustment insomnia: Secondary | ICD-10-CM | POA: Insufficient documentation

## 2017-09-08 DIAGNOSIS — R1011 Right upper quadrant pain: Secondary | ICD-10-CM | POA: Insufficient documentation

## 2017-09-08 LAB — CBC NO DIFFERENTIAL
HEMATOCRIT: 44.3 % (ref 36.0–46.0)
HEMOGLOBIN: 14.9 g/dL (ref 12.0–16.0)
MCH: 32.5 pg (ref 27.0–33.0)
MCHC: 33.7 % (ref 32.0–36.0)
MCV: 96.5 fL (ref 80.0–100.0)
MPV: 7.6 fL (ref 6.8–10.0)
PLATELET COUNT: 403 10*3/uL — AB (ref 130–400)
RDW: 12.8 % (ref 0.0–14.7)
RED CELL COUNT: 4.59 10*6/uL (ref 4.00–5.20)
WHITE BLOOD CELL COUNT: 5.9 10*3/uL (ref 4.5–11.0)

## 2017-09-08 LAB — LIPASE: LIPASE: 33 U/L (ref 13–51)

## 2017-09-08 LAB — COMPREHENSIVE METABOLIC PANEL
ALANINE TRANSFERASE (ALT): 31 U/L (ref 5–54)
ALBUMIN: 4.4 g/dL (ref 3.2–4.6)
ALKALINE PHOSPHATASE (ALP): 57 U/L (ref 35–115)
ASPARTATE TRANSAMINASE (AST): 25 U/L (ref 15–43)
BILIRUBIN TOTAL: 0.5 mg/dL (ref 0.3–1.3)
CALCIUM: 9.8 mg/dL (ref 8.6–10.5)
CARBON DIOXIDE TOTAL: 27 mmol/L (ref 24–32)
CREATININE BLOOD: 0.84 mg/dL (ref 0.44–1.27)
Chloride: 101 mmol/L (ref 95–110)
Glucose: 77 mg/dL (ref 70–99)
POTASSIUM: 4.5 mmol/L (ref 3.3–5.0)
PROTEIN: 7 g/dL (ref 6.3–8.3)
SODIUM: 140 mmol/L (ref 135–145)
UREA NITROGEN, BLOOD (BUN): 14 mg/dL (ref 8–22)

## 2017-09-08 LAB — B-TYPE NATRIURETIC PEPTIDE: B-TYPE NATRIURETIC PEPTIDE: 30 pg/mL (ref 1–100)

## 2017-09-08 LAB — TSH WITH FREE T4 REFLEX: THYROID STIMULATING HORMONE: 1.8 u[IU]/mL (ref 0.35–3.30)

## 2017-09-08 LAB — AMYLASE: AMYLASE: 69 U/L (ref 33–130)

## 2017-09-08 MED ORDER — TRAZODONE 50 MG TABLET
50.0000 mg | ORAL_TABLET | Freq: Every day | ORAL | 0 refills | Status: AC
Start: 2017-09-08 — End: 2018-09-22

## 2017-09-08 MED ORDER — FLUTICASONE 110 MCG/ACTUATION AEROSOL ORAL INHALER
1.0000 | INHALATION_SPRAY | Freq: Two times a day (BID) | RESPIRATORY_TRACT | 0 refills | Status: AC
Start: 2017-09-08 — End: 2018-09-22

## 2017-09-08 NOTE — Progress Notes (Signed)
[place]     Chief Complaint   Patient presents with    Swelling     swelling in ankles       HISTORY     Erica Hancock is a 62yr old female who presents with the following chief complaint:    1. Recently treated for influenza.  Still has a low-grade temperature, mild.  Fatigue.  Nonspecific malaise.  Has a residual cough that is nonproductive.  Mild airway inflammation.  Minimal wheezing.  Mostly in the evenings and early morning.  Has not been using her inhaler for several days.  Ran out of steroid inhaler.  Seeks advice.  2. Concerned about mild swelling in both feet.  Going on for about a month.  Prior to the influenza.  She drinks fluids throughout the day but not excessively.  She does not have excessive salt intake.  No orthopnea.  Wants to make sure her heart and organs are okay.  3. Insomnia.  She has her own business.  Recently married.  Lots of transitions in life.  History of depression in the past.  Has done well on amitriptyline for the past 20 years but feels that it is not working as well for her.  Recently taking Benadryl for sleep.  Seeks advice.    REVIEW OF SYSTEMS     Constitutional: fatigue.  CV: negative.  Resp: No frank wheezing, no hemoptysis.    SOCIAL HISTORY     Social History     Socioeconomic History    Marital status: MARRIED     Spouse name: Not on file    Number of children: Not on file    Years of education: Not on file    Highest education level: Not on file   Occupational History    Occupation: self employed     Comment: Aeronautical engineer   Social Needs    Financial resource strain: Not on file    Food insecurity:     Worry: Not on file     Inability: Not on file    Transportation needs:     Medical: Not on file     Non-medical: Not on file   Tobacco Use    Smoking status: Former Smoker     Packs/day: 1.00     Years: 12.00     Pack years: 12.00     Types: Cigarettes     Last attempt to quit: 03/01/1979     Years since  quitting: 38.5    Smokeless tobacco: Never Used   Substance and Sexual Activity    Alcohol use: Yes     Alcohol/week: 3.6 oz     Types: 6 Glasses of wine per week     Comment: 1/day    Drug use: Yes     Frequency: 1.0 times per week     Types: Marijuana     Comment: once daily oil    Sexual activity: Not on file   Lifestyle    Physical activity:     Days per week: Not on file     Minutes per session: Not on file    Stress: Not on file   Relationships    Social connections:     Talks on phone: Not on file     Gets together: Not on file     Attends religious service: Not on file     Active member of club or organization: Not on file  Attends meetings of clubs or organizations: Not on file     Relationship status: Not on file    Intimate partner violence:     Fear of current or ex partner: Not on file     Emotionally abused: Not on file     Physically abused: Not on file     Forced sexual activity: Not on file   Other Topics Concern    Not on file   Social History Narrative    Not on file       PROBLEM LIST     Patient Active Problem List   Diagnosis    Migraines    Insomnia    Arthritis of carpometacarpal (CMC) joint of right thumb    Asthma    HLD (hyperlipidemia)    Spondylolisthesis of lumbar region    Foraminal stenosis of lumbar region    Lumbar radicular pain    Calcific tendinitis of left shoulder    Pain in joint of left shoulder    Basal cell carcinoma of skin of nose    Basal cell carcinoma    Osteopenia    Recurrent major depressive disorder, in partial remission ([place])    Mixed hypercholesterolemia and hypertriglyceridemia    Tear of left supraspinatus tendon    Severe osteoarthritis of left AC (acromioclavicular) joint    Elevated TSH    Tarlov cyst    Left leg pain    Pain and swelling of left lower leg    S/P lumbar fusion    Reactive depression       MEDICATIONS     Outpatient Encounter Medications as of 09/08/2017   Medication Sig Dispense Refill    Amitriptyline  (ELAVIL) 50 mg Tablet Take 1 tablet by mouth every day at bedtime. Indications: depression 90 tablet 1    BECLOMETHASONE DIPROPIONATE (QVAR INHA) Take 1 puff by inhalation once daily if needed.                 Benzonatate (TESSALON PERLES) 100 mg Capsule Take 1-2 capsules by mouth three times daily if needed. 50 capsule 0    ESTRACE 0.01 % (0.1 mg/gram) Vaginal Cream Insert into the vagina three times a week. (Patient taking differently: Insert 1 g into the vagina As Directed. Insert into the vagina three times a week.          ) 42.5 g 3    Fluticasone (FLOVENT HFA) 110 mcg/actuation Inhaler Take 1-2 puffs by inhalation every 12 hours. 17 g 0    lactase (LACTAID FAST ACT PO) Take 1 Caplet by mouth once daily if needed. With meal as needed                Sennosides (SENOKOT) 8.6 mg Tablet Take 2 tablets by mouth every day at bedtime. (Patient taking differently: Take 2 tablets by mouth every day at bedtime.           ) 60 tablet 0    Trazodone (DESYREL) 50 mg Tablet Take 1 tablet by mouth every day at bedtime. 30 tablet 0    [DISCONTINUED] baclofen 5 mg Tablet Take 0.5-1 tablets by mouth 3 times daily. 45 tablet 0    [DISCONTINUED] Fluticasone (ALLER-FLO) 50 mcg/actuation nasal spray Spray 1-2 sprays into each nostril two times daily if needed.       No facility-administered encounter medications on file as of 09/08/2017.        VITAL SIGNS    weight is 68.9 kg (151  lb 14.4 oz). Her temporal temperature is 37.4 C (99.3 F). Her blood pressure is 105/69 and her pulse is 83. Her respiration is 16.   PHYSICAL     General Appearance: healthy, alert, no distress, pleasant affect, cooperative.  Eyes:  conjunctivae and corneas clear. PERRL, EOM's intact. sclerae normal.  Mouth: normal.  Neck:  Neck supple. No adenopathy, thyroid symmetric, normal size.  Heart:  normal rate and regular rhythm, no murmurs, clicks, or gallops.  Lungs: No rhonchi or crackles.  End inspiratory cough.  Clear otherwise.Marland Kitchen  Extremities:   no cyanosis, clubbing, or edema.  Skin:  Scattered benign pigmented homogenous nevi on sun exposed areas.   Neuro: normal .  Mental Status: euthymic.  Logical and goal-directed.  Normal thought process and concentration.    ASSESSMENT AND PLAN     (J45.41) Moderate persistent reactive airway disease with acute exacerbation  (primary encounter diagnosis)  Comment: Discussed the pathophysiology of reactive airway disease, compliance with medical regimen, various types of asthma medications, and trigger factors.  Discussed prophylactic or 'controller' medications, and episodic or 'rescue' medications.   Plan: Resume Flovent twice daily.    (J11.1) Influenza  Comment: Resolving.  Rest, fluids, proper nutrition, contact precautions.  Basic labs ordered.    (R60.0) Pedal edema  Comment: Suspect due to hydration but will rule out any constitutional issues.  Plan: COMPREHENSIVE METABOLIC PANEL, B-TYPE         NATRIURETIC PEPTIDE          (F51.02) Adjustment insomnia  Comment: Discussed general treatment options of insomnia and principles of good sleep hygiene. Addressed potential underlying cause and reviewed patient's habits. Discussed hypnotic medication including benefits and risks.    Plan: trial of trazodone at bedtime. Risks and benefits discussed in detail.      Orders Placed This Encounter    COMPREHENSIVE METABOLIC PANEL     Standing Status:   Future     Standing Expiration Date:   09/08/2018    B-TYPE NATRIURETIC PEPTIDE     Standing Status:   Future     Standing Expiration Date:   09/08/2018    Fluticasone (FLOVENT HFA) 110 mcg/actuation Inhaler     Sig: Take 1-2 puffs by inhalation every 12 hours.     Dispense:  17 g     Refill:  0    Trazodone (DESYREL) 50 mg Tablet     Sig: Take 1 tablet by mouth every day at bedtime.     Dispense:  30 tablet     Refill:  0     Discussed care and warning signs with Trinaty and all questions and concerns were fully answered. She will call or followup in the office if any  problems arise.   Barriers to Learning assessed: none. Patient verbalizes understanding of teaching and instructions.  The patient and/or caregiver was educated regarding his/her medical care. No guarantees were made regarding his medical care or treatment outcome.  I reviewed past medical, family/social and medication history during the visit.     This note was created using the support of [employer] 10.1. Please note any grammatical, sound-alike, or syntax errors as likely dictation errors.    Electronically signed by:        Jenness Corner, MD  Associate Physician in [place]

## 2017-09-08 NOTE — Nursing Note (Signed)
Patient roomed, chief complaint noted, allergies verified, blood pressure, pulse, respiration, and weight obtained, screened for pain, and pharmacy verified.  Hayden Mabin, M.A.

## 2017-09-27 ENCOUNTER — Encounter: Payer: Self-pay | Admitting: Family

## 2017-09-27 ENCOUNTER — Ambulatory Visit: Admitting: Family

## 2017-09-27 VITALS — BP 120/80 | HR 94 | Temp 98.4°F | Resp 16 | Wt 147.7 lb

## 2017-09-27 DIAGNOSIS — L710 Perioral dermatitis: Principal | ICD-10-CM

## 2017-09-27 DIAGNOSIS — J45909 Unspecified asthma, uncomplicated: Secondary | ICD-10-CM

## 2017-09-27 MED ORDER — DOXYCYCLINE HYCLATE 100 MG CAPSULE
ORAL_CAPSULE | ORAL | 0 refills | Status: AC
Start: 2017-09-27 — End: 2017-12-26

## 2017-09-27 MED ORDER — ALBUTEROL SULFATE HFA 90 MCG/ACTUATION AEROSOL INHALER
2.0000 | INHALATION_SPRAY | RESPIRATORY_TRACT | 0 refills | Status: DC | PRN
Start: 2017-09-27 — End: 2018-03-23

## 2017-09-27 NOTE — Progress Notes (Signed)
Subjective:   History of perioral dermatitis with similar symptoms today.   Papules around left side of mouth.   Improved in the past with 10 day course of doxycycline but then problem returns.     ROS:  Pulm: at end of visit asks re asthma inhalers.   She has q var at home about 3 inhalers.   She does not have rescue inhaler.   She has a peak flow meter and knows her normal range   Objective:  Ga: in no apparent distress. BP 120/80 (SITE: right arm, Orthostatic Position: sitting, Cuff Size: regular)   Pulse 94   Temp 36.9 C (98.4 F) (Temporal)   Resp 16   Wt 67 kg (147 lb 11.3 oz)   BMI 26.17 kg/m   Skin:   Closed comedones around left side of mouth.   Other skin areas normal.   Assessment/Plan:  (L71.0) Perioral dermatitis  (primary encounter diagnosis)  Comment: will need to extend course over a couple months to prevent recurrence.   Bid x 10 days then once a day for the rest of the month continuing for 2 more months   Plan: Doxycycline 100 mg Capsule            (J45.909) Asthma, unspecified asthma severity, unspecified whether complicated, unspecified whether persistent  Comment:   Plan: reviewed asthma action plan.   She has q var (not using flovent)   Use albuteral inhaler 2 inhalations every  4 hours for the next few days and then when needed.  Use with aerochamber        Plan: see patient instructions also.  Advised to call back directly if there are further questions, or if these symptoms fail to improve as anticipated or worsen.  I did review patient's medical history.  Barriers to Learning assessed: none. Patient verbalizes understanding of teaching and instructions.  Overseeing provider Dr Drue Stager      Electronically Signed By:  Darnelle Bos, [place]   Family Nurse Practitioner  PI# [phone]

## 2017-09-27 NOTE — Patient Instructions (Addendum)
Doxycycline twice a day for 10 days and then once a day for the rest of the month and for 2 additional months.     Wait 30 minutes after taking it before you lie down.     1.expose your skin to the sun as least as possible;  2.use sunscreen with a sun protection factor (SPF) of at least 30 (higher protection may be required);  3.wear light, long clothing;  4.avoid tanning salons;  5.wear a broad-rimmed hat;  6.seek shade when you are outdoors  7.try and limit outdoor activities between 10 am and 4 pm.     If you need to use albuteral inhaler more than twice a week or peak flow is less than normal twice a week, start your steroid inhaler and use through that season or at least for a month.  Call with any questions.      Use albuteral inhaler 2 inhalations every  4 hours for the next few days and then when needed.  Use with aerochamber    steroid inhaler, 2 inhalation twice a day for a month.  Rinse mouth after use. Use with aerochamber    Honor Junes are:   March through June  June through august  August through November  November through February.     Good Asthma control:   *Using albuteral quick relief medication no more than twice a week  *Not waking more than twice a month with asthma symptoms  *Not needing to refill albuteral more than twice a year

## 2017-10-02 ENCOUNTER — Telehealth: Payer: Self-pay | Admitting: Family Medicine

## 2017-10-03 ENCOUNTER — Telehealth: Payer: Self-pay | Admitting: Adult Health

## 2017-10-03 ENCOUNTER — Telehealth: Payer: Self-pay | Admitting: Family Medicine

## 2017-10-03 DIAGNOSIS — R110 Nausea: Principal | ICD-10-CM

## 2017-10-03 MED ORDER — ONDANSETRON HCL 4 MG TABLET
4.0000 mg | ORAL_TABLET | Freq: Three times a day (TID) | ORAL | 0 refills | Status: AC | PRN
Start: 2017-10-03 — End: 2018-09-28

## 2017-10-03 NOTE — Telephone Encounter (Signed)
3 patient identifiers used  Reviewed message from MD with patient. Questions were answered and no further questions or concerns at this time. Advised to please callback to the advice line at any time if questions arise.  Amybeth Sieg, RN  PCN Triage

## 2017-10-03 NOTE — Telephone Encounter (Signed)
Erica Hancock is a 62yr old female  3 patient identifiers used.  Per:   patient.      ClearTriage Note:  Patient reports 5 days experiencing lots of nausea, dizzy on and off initially, dizziness better now.  Patient report staking new medication, Trazodone (DESYREL) 50 mg Tablet earlier in July.  Patient reports symptoms are "hit or miss," but most of the time feeling nausea.  Patient reports also stool is light color, light brown and history of constipation.  Patient denies white, gray or blood in stools.  Patient denies fever, chest pain or difficulty breathing.  Patient reports constantly working outside and inside house.  Patient reports drinking fluids well.  Patient reports not sleeping well for 4-5 months, trying trazodone without relief, no improvement.    Protocol Used: Nausea (Adult)  Protocol-Based Disposition: Call PCP within 24 Hours    Positive Triage Question:  * Taking prescription medication that could cause nausea (e.g., narcotics/opiates, antibiotics, OCPs, many others)    Negative Triage Questions:  * Shock suspected (e.g., cold/pale/clammy skin, too weak to stand, low BP, rapid pulse)  * Sounds like a life-threatening emergency to the triager  * Unable to walk, or can only walk with assistance (e.g., requires support)  * Difficulty breathing  * [1] Drinking very little AND [2] dehydration suspected (e.g., no urine > 12 hours, very dry mouth, very lightheaded)  * Patient sounds very sick or weak to the triager  * Fever > 104 F (40 C)  * [1] Fever > 101 F (38.3 C) AND [2] age > 76  * [1] Fever > 100.0 F (37.8 C) AND [2] bedridden (e.g., nursing home patient, CVA, chronic illness, recovering from surgery)  * [1] Fever > 100.0 F (37.8 C) AND [2] diabetes mellitus or weak immune system (e.g., HIV positive, cancer chemo, splenectomy, organ transplant, chronic steroids)  * Taking any of the following medications: digoxin (Lanoxin), lithium, theophylline, phenytoin (Dilantin)  * Fever present > 3 days  (72 hours)  * Receiving cancer chemotherapy medication    Disposition:  Appointment scheduled with PCP on Thursday at 1 pm, 30 minutes scheduled.  No appointments available today.  Consult with MD  Please advise.  Patient asking if PCP believes nausea is related to Trazodone?  Patient states trazodone is not helping with sleep.  Patient requesting Rx or advice for nausea until appointment 8/8?  Allergies and Pharmacy verified.  Thank you.     Per:   patient verbalizes agreement to plan. Agrees to callback with any increase in symptoms/concerns or questions.     Adriana Simas, RN  PCN

## 2017-10-03 NOTE — Telephone Encounter (Signed)
Rx Zofran sent to pharmacy.  Please advise.

## 2017-10-03 NOTE — Telephone Encounter (Signed)
Erica Hancock is a 62yr old female  3 patient identifiers used    Spoke with patient, advised of Dr. Roda Shutters message. Patient verbalizes understanding and agrees with plan.  Reviewed nausea home care with patient per nausea, adult protocol.  Patient states she has been trying most of this.  Patient denies any further questions or concerns at this time.    Protocol Used: Nausea (Adult)  Protocol-Based Disposition: Home Care    Positive Triage Question:  * Unexplained nausea    Care Advice Discussed:  * Clear Fluids      - Take clear fluids in small amounts until the nausea is resolved for 8 hours.      - Sip water or rehydration liquid (Gatorade or Powerade).      - Other options: 1/2 strength flat lemon-lime soda or ginger ale.  * Return to Normal Diet      - Gradually return to a normal diet. Start with saltine crackers, white bread, rice, mashed potatoes, cereal, apple sauce etc.  * Avoid Meds      - Stop taking all non-prescription medicines. (Reason: may make nausea worse.)      - Avoid NSAIDs, which can cause gastritis        Thank you  Adriana Simas, RN  PCN Triage

## 2017-10-03 NOTE — Telephone Encounter (Signed)
Please advise patient to stop trazodone at this time.  Please recommend symptomatic relief for nausea.  Otherwise, we will discuss her sleep and nausea further on Thursday.  Thank

## 2017-10-03 NOTE — Telephone Encounter (Signed)
Patient called in, HIPAA/3x identifiers verified (Name, DOB, address, and/or last 4 of SSN)    Patient wanting to add to this stating that she is requesting nausea medication for this reason.   Please advise.

## 2017-10-03 NOTE — Telephone Encounter (Signed)
Left message for patient to return call to confirm colonoscopy appt and discuss bowel prep instructions.  When patient returns call, please relay the following information:    This is [facility] calling about your appointment. Please read your instructions 5 days before your appointment. Please follow those instructions and not what it says on the container. Starting 5 days prior to procedure avoid high fiber foods, nuts and seeds. Also avoid Motrin, Ibuprofen, Aleve, and Naproxen. You will need to be on a clear liquid diet the day before procedure (please avoid anything red in color), black coffee, and tea with no dairy or creamer is allowed. It is important to split your prep (half the prep at 6 pm the night before your procedure and the other half 4 hours before your arrival time). It is recommended to drink more water than indicated to help clean out your colon for the procedure especially if you were prescribed Suprep. Please have nothing to drink 3 hours prior to appointment time. If you take medication for blood pressure, heart problems, or seizures, please take these with just a sip of water at least 3 hours prior to appointment time. If you plan on receiving sedation for your procedure, you must have a driver who will either wait at the clinic or be available to return within 15 minutes of a phone call. Public transportation (bus, taxi, Lyft, Uber etc.) is not allowed. If you have any questions or need to cancel or reschedule, please call [phone].    **The prescription for your colon preparation medication is usually sent to your pharmacy a couple months in advance (when your referral is placed). If you are calling your pharmacy regarding the prescription, be sure to mention the above statement

## 2017-10-03 NOTE — Addendum Note (Signed)
Addended byRonette Deter, Marvalene Barrett on: 10/03/2017 04:52 PM     Modules accepted: Orders

## 2017-10-04 NOTE — Progress Notes (Deleted)
No chief complaint on file.      Subjective:  Erica Hancock is a 62yr old female who presents with the following chief complaint: ***      ***    Review of Systems:   Review of Systems:10510  REVIEW OF SYSTEMS  General:  no new tiredness, weight loss or gain, or changes in appetite.   Head: no headaches, dizziness or syncope.  Eyes: no visual changes.  Ears: no hearing loss.  Oropharynx: no dysphagia.  Lungs: no prolonged cough or dyspnea.  CV: no chest pain or palpitations.  GI: no abdominal pain, change in bowel habits, black or bloody stools.   Urinary: no dysuria, urgency or hematuria.  ***GYN ROS: She  denies abnormal vaginal bleeding, discharge or unusual pelvic pain.   Musculoskeletal: no new symptoms, ongoing arthritis.  Neurological ROS:  Denies confusion, vertigo or dizziness, diplopia, aphasia, seizure activity, new incontinence, unilateral disturbance of motor or sensory function, or other symptoms of neurological impairment. No TIA's or amaurosis fugax.    Social History     Tobacco Use    Smoking status: Former Smoker     Packs/day: 1.00     Years: 12.00     Pack years: 12.00     Types: Cigarettes     Last attempt to quit: 03/01/1979     Years since quitting: 38.6    Smokeless tobacco: Never Used   Substance Use Topics    Alcohol use: Yes     Alcohol/week: 3.6 oz     Types: 6 Glasses of wine per week     Comment: 1/day    Drug use: Yes     Frequency: 1.0 times per week     Types: Marijuana     Comment: once daily oil       Patient Active Problem List   Diagnosis    Migraines    Insomnia    Arthritis of carpometacarpal (CMC) joint of right thumb    Asthma    HLD (hyperlipidemia)    Spondylolisthesis of lumbar region    Foraminal stenosis of lumbar region    Lumbar radicular pain    Calcific tendinitis of left shoulder    Pain in joint of left shoulder    Basal cell carcinoma of skin of nose    Basal cell carcinoma    Osteopenia    Recurrent major depressive disorder, in partial  remission ([place])    Mixed hypercholesterolemia and hypertriglyceridemia    Tear of left supraspinatus tendon    Severe osteoarthritis of left AC (acromioclavicular) joint    Elevated TSH    Tarlov cyst    Left leg pain    Pain and swelling of left lower leg    S/P lumbar fusion    Reactive depression     Current Outpatient Medications on File Prior to Visit   Medication Sig Dispense Refill    Albuterol (PROAIR HFA, PROVENTIL HFA, VENTOLIN HFA) 90 mcg/actuation inhaler Take 2 puffs by inhalation every 4 hours if needed. 1 inhaler 0    Amitriptyline (ELAVIL) 50 mg Tablet Take 1 tablet by mouth every day at bedtime. Indications: depression 90 tablet 1    BECLOMETHASONE DIPROPIONATE (QVAR INHA) Take 1 puff by inhalation once daily if needed.                 Benzonatate (TESSALON PERLES) 100 mg Capsule Take 1-2 capsules by mouth three times daily if needed. 50 capsule 0    Doxycycline  100 mg Capsule Take twice a day for 10 days and then once a day for 2 months.   take with 8 oz of water 130 capsule 0    ESTRACE 0.01 % (0.1 mg/gram) Vaginal Cream Insert into the vagina three times a week. (Patient taking differently: Insert 1 g into the vagina As Directed. Insert into the vagina three times a week.          ) 42.5 g 3    Fluticasone (FLOVENT HFA) 110 mcg/actuation Inhaler Take 1-2 puffs by inhalation every 12 hours. 17 g 0    lactase (LACTAID FAST ACT PO) Take 1 Caplet by mouth once daily if needed. With meal as needed                Ondansetron (ZOFRAN) 4 mg Tablet Take 1 tablet by mouth every 8 hours if needed. 9 tablet 0    Sennosides (SENOKOT) 8.6 mg Tablet Take 2 tablets by mouth every day at bedtime. (Patient taking differently: Take 2 tablets by mouth every day at bedtime.           ) 60 tablet 0    Trazodone (DESYREL) 50 mg Tablet Take 1 tablet by mouth every day at bedtime. 30 tablet 0     No current facility-administered medications on file prior to visit.      There were no vitals taken for  this visit.    OBJECTIVE:   General Physical Assessment:10547    ASSESSMENT AND PLAN AS DISCUSSED WITH THE PATIENT:      No diagnosis found.    ***    Health Maintenance reviewed -  :53.  Health Maintenance   Topic Date Due    Pneumococcal vaccine (1 of 1 - PPSV23) 12/13/1972    DTAP/TDAP/TD (1 - Tdap) 12/13/1972    CERVICAL HPV TEST (WITH NEXT PAP)  12/14/1983    PAP-GYN CYTOLOGY  12/14/1983    Shingrix (Zoster) (1 of 2) 12/14/2003    COLONOSCOPY  12/14/2003    INFLUENZA  09/28/2017    MAMMOGRAM  05/13/2019    HEPATITIS C SCREENING  Addressed    HIV SCREENING  Addressed         Discussed care and warning signs with Lynnett and all questions and concerns were fully answered. She will call or followup in the office if any problems arise.     I have reviewed the patient's medical history in detail and updated the computerized patient record.    Barriers to Learning assessed: none.   Patient verbalizes understanding of teaching and instructions.    The patient and/or caregiver was educated regarding his/her medical care.   No guarantees were made regarding his/her medical care or treatment outcome.    I reviewed past medical, family/social and medication history during the visit.     This note was created using the support of [employer] and/or Pharmacist, community. Please note any grammatical, sound-alike, or syntax errors as likely dictation errors.      Electronically signed by:    Otis Peak, DO  Board-Certified, American Board of Family Medicine  Associate Physician in [place], [place]  ***

## 2017-10-05 ENCOUNTER — Ambulatory Visit: Admitting: Family Medicine

## 2017-10-09 NOTE — Telephone Encounter (Signed)
Patient called in, HIPAA verified, patient was advised verbatim of RN's message.    Patient states she ate aprox 2-4 oz of peanut M&M's.     She also had granola with oatmeal in in several days before.     Patient also states she has been constipated but is drinking a lot of water.     Please advise if patient is ok to proceeded with procedure.     Please call patient at [phone]

## 2017-10-09 NOTE — Telephone Encounter (Signed)
Telephone call to patient--verified name, DOB, MRN and/or address at initiation of call.  Patient is concerned because she ate peanut m&m's yesterday.    Patient started clear liquids this morning and will start bowel prep by 6pm.  Patient advised to continue with bowel prep.  She also reports she has constipation.  Patient will call in the morning by 8am if stool is not clear after drinking second half of the prep.  She understands she may need to drink more prep tomorrow and reschedule to weds if needed.

## 2017-10-10 ENCOUNTER — Encounter: Payer: Self-pay | Admitting: Adult Health

## 2017-10-10 ENCOUNTER — Ambulatory Visit: Admitting: Adult Health

## 2017-10-10 MED ORDER — PEG3350 100 GRAM-SOD SULF 7.5 GRAM-NACL-KCL-ASCORBATE-C ORAL PWDR PACK
ORAL | 0 refills | Status: DC
Start: 2017-10-10 — End: 2017-10-11

## 2017-10-10 NOTE — H&P (Addendum)
GASTROENTEROLOGY PRE-PROCEDURE HISTORY & PHYSICAL    Date of Service: 10/10/2017    Referring Provider:  PCP: Harley Alto, DO    Procedure:  Colonoscopy    Attending: Leonides Sake, MD    HPI: Erica Hancock is a 81yr year old with chronic constipation who presents for colon cancer screening.  Due to her history of constipation, thus 2 day prep was ordered. Her stools are currently clear yellow. She denies abdominal pain, hematochezia.    The patient's past medical, family, and social history was reviewed.    Personal history of colon cancer/polyps: denies    Family history of colon cancer/polyps: denies    Prior colonoscopy or colon cancer screening: 2006 - no polyps.    Physical Exam:   General Appearance: Healthy, alert, no distress, pleasant affect, cooperative.   Eyes: sclera anicteric.   Heart: normal rate and regular rhythm, no murmurs, clicks, or gallops.   Lungs: normal respiratory rate and rhythm.   Abdomen: no masses palpable, no organomegaly, soft, non-tender.   Extremities: no cyanosis, clubbing, or edema.     IMMEDIATE PRE-SEDATION ASSESSMENT  Pre-Sedation/Anesthesia Assessment:    2 - Mild, controlled systemic disease and no functional limitations    Airway Assessment:    Mallampati class 1  ROM normal    Impression/Plan:  Colonoscopy for colon cancer screening/surveillance.     Patient is a candidate for moderate sedation.    The procedure, risks, benefits, and alternatives were explained.  All patient questions were answered.  The informed consent was signed.    Patient barriers to learning:  none    Patient/family understanding:  verbalizes    Electronically Signed: Mylinda Latina, MD    This patient was seen, evaluated, and care plan was developed with the resident.  I agree with the assessment and plan as outlined in the resident's note.  Report electronically signed by Melida Quitter, MD. Attending

## 2017-10-10 NOTE — Telephone Encounter (Signed)
Telephone call to patient--verified name, DOB, MRN and/or address at initiation of call.  Patient advised of colonoscopy appt tomorrow at 1230pm at the [facility] location.

## 2017-10-10 NOTE — Telephone Encounter (Signed)
Telephone call to patient--verified name, DOB, MRN and/or address at initiation of call.  Patient reports last bm a few minutes ago was not clear.  She did not do a two day prep.  After drinking the prep yesterday she only had a few bm's.  Patient will stay on clear liquids and this RN will try to find her an appt at a different location tomorrow.

## 2017-10-10 NOTE — Addendum Note (Signed)
Addended by: Helane Rima on: 10/10/2017 07:55 AM     Modules accepted: Orders

## 2017-10-10 NOTE — Progress Notes (Deleted)
Pt called and reported bowels not adequately prepped with 1 day purgative/clear liquid. She will reschedule procedure and follow a 2 day clear liquid diet + purgative.     Assunta Gambles, NP  Division of Gastroenterology  PI# [phone]

## 2017-10-10 NOTE — Addendum Note (Signed)
Addended byJeral Pinch on: 10/10/2017 10:32 AM     Modules accepted: Orders

## 2017-10-11 ENCOUNTER — Encounter: Payer: Self-pay | Admitting: GASTROENTEROLOGY

## 2017-10-11 ENCOUNTER — Ambulatory Visit
Admission: RE | Admit: 2017-10-11 | Discharge: 2017-10-11 | Disposition: A | Discharge status: Home / Self Care | Source: Ambulatory Visit | Attending: GASTROENTEROLOGY | Admitting: GASTROENTEROLOGY

## 2017-10-11 DIAGNOSIS — D125 Benign neoplasm of sigmoid colon: Secondary | ICD-10-CM | POA: Insufficient documentation

## 2017-10-11 DIAGNOSIS — K5900 Constipation, unspecified: Secondary | ICD-10-CM | POA: Insufficient documentation

## 2017-10-11 DIAGNOSIS — Z1211 Encounter for screening for malignant neoplasm of colon: Secondary | ICD-10-CM | POA: Insufficient documentation

## 2017-10-11 DIAGNOSIS — D123 Benign neoplasm of transverse colon: Secondary | ICD-10-CM | POA: Insufficient documentation

## 2017-10-11 DIAGNOSIS — K635 Polyp of colon: Principal | ICD-10-CM | POA: Insufficient documentation

## 2017-10-11 MED ORDER — EPINEPHRINE 0.1 MG/ML INJECTION SYRINGE
1.0000 mg | INJECTION | INTRAMUSCULAR | Status: DC | PRN
Start: 2017-10-11 — End: 2017-10-17

## 2017-10-11 MED ORDER — FLUMAZENIL 0.1 MG/ML INTRAVENOUS SOLUTION
0.2000 mg | INTRAVENOUS | Status: DC | PRN
Start: 2017-10-11 — End: 2017-10-17

## 2017-10-11 MED ORDER — ATROPINE 0.1 MG/ML INJECTION SYRINGE
0.5000 mg | INJECTION | INTRAMUSCULAR | Status: DC | PRN
Start: 2017-10-11 — End: 2017-10-17

## 2017-10-11 MED ORDER — LACTATED RINGERS IV INFUSION
INTRAVENOUS | Status: DC
Start: 2017-10-11 — End: 2017-10-17
  Administered 2017-10-11 (×2): via INTRAVENOUS

## 2017-10-11 MED ORDER — NALOXONE 0.4 MG/ML INJECTION SOLUTION
0.4000 mg | INTRAMUSCULAR | Status: DC | PRN
Start: 2017-10-11 — End: 2017-10-17

## 2017-10-11 MED ORDER — DIPHENHYDRAMINE 50 MG/ML INJECTION SOLUTION
12.5000 mg | INTRAMUSCULAR | Status: DC | PRN
Start: 2017-10-11 — End: 2017-10-17
  Filled 2017-10-11: qty 1, fill #0

## 2017-10-11 MED ORDER — EPINEPHRINE 0.1 MG/ML INJECTION SYRINGE
0.3000 mg | INJECTION | INTRAMUSCULAR | Status: DC | PRN
Start: 2017-10-11 — End: 2017-10-17

## 2017-10-11 MED ORDER — MEPERIDINE (PF) 100 MG/ML INJECTION SOLUTION
12.5000 mg | INTRAMUSCULAR | Status: DC | PRN
Start: 2017-10-11 — End: 2017-10-17
  Administered 2017-10-11: 75 mg via INTRAVENOUS
  Filled 2017-10-11: qty 2, fill #0

## 2017-10-11 MED ORDER — EPINEPHRINE 0.1 MG/ML INJECTION SYRINGE
0.3000 mg | INJECTION | INTRAMUSCULAR | Status: DC | PRN
Start: 2017-10-11 — End: 2017-10-17
  Filled 2017-10-11: qty 10, fill #0

## 2017-10-11 MED ORDER — SIMETHICONE 40 MG/0.6 ML ORAL DROPS,SUSPENSION
40.0000 mg | Freq: Once | ORAL | Status: DC
Start: 2017-10-11 — End: 2017-10-17
  Filled 2017-10-11: qty 0.6, fill #0

## 2017-10-11 MED ORDER — MIDAZOLAM (PF) 1 MG/ML INJECTION SOLUTION
0.5000 mg | INTRAMUSCULAR | Status: DC | PRN
Start: 2017-10-11 — End: 2017-10-17
  Administered 2017-10-11: 4 mg via INTRAVENOUS
  Filled 2017-10-11: qty 10, fill #0

## 2017-10-11 NOTE — Nurse Focus (Signed)
WOUND CLEANUP NOTE    Note Started: 10/11/2017, 12:58     To maintain record integrity and accuracy, 1 active wound was removed from the EMR database.    Leo Grosser, RN

## 2017-10-11 NOTE — Nurse Focus (Signed)
NURSING PRE-SEDATION ASSESSMENT    Erica Hancock arrived by ambulating into clinic today at 1220 from home.      Patient has arranged for a ride home from Jenny Reichmann (husband) who can be contacted at [phone]. Instructed patient that post sedation, they are not to drive or drink alcohol, sign any important/legal documents or make important decisions  for the remainder of the day and patient verbalized understanding.      Identification band placed on and two forms of identification information verified: yes    Verified procedure with the patient. yes    NPO since: 10/11/2017 at 0930, finished 2nd half of Moviprep  No solid food for 2 days    Patient completed bowel prep: Yes      Past Surgical History:   Procedure Laterality Date    INJECTION, STEROID      Right thumb    LAMINECTOMY, LUMBAR, POSTERIOR APPROACH  01/26/2017    L4 laminectomy, bilateral L3-4 and L4-5 foraminotomies    PR ARTHRODESIS POSTERIOR/POSTEROLATERAL LUMBAR  01/26/2017    L4-5 arthrodesis/instrumentation using pedicle screws x 4, rods x 2 with in-situ autograft and InFuse (small)    PR COLONOSCOPY FLX DX W/COLLJ SPEC WHEN PFRMD  2006    no polyps per pt    PR HAND/FINGER SURG PROC UNLISTED Right     thumb    PR VAGINAL HYSTERECTOMY,UTERUS 250 GMS/<      one ovary removed, couldn't find other    TONSILLECTOMY         Social History     Tobacco Use    Smoking status: Former Smoker     Packs/day: 1.00     Years: 12.00     Pack years: 12.00     Types: Cigarettes     Last attempt to quit: 03/01/1979     Years since quitting: 38.6    Smokeless tobacco: Never Used   Substance Use Topics    Alcohol use: Yes     Alcohol/week: 6.0 standard drinks     Types: 6 Glasses of wine per week     Comment: 1/day       Social History     Substance and Sexual Activity   Drug Use Yes    Frequency: 1.0 times per week    Types: Marijuana    Comment: once daily oil        Past anesthesia responses:  No problem.      Current Outpatient Medications   Medication  Sig    Albuterol (PROAIR HFA, PROVENTIL HFA, VENTOLIN HFA) 90 mcg/actuation inhaler Take 2 puffs by inhalation every 4 hours if needed.    Amitriptyline (ELAVIL) 50 mg Tablet Take 1 tablet by mouth every day at bedtime. Indications: depression    BECLOMETHASONE DIPROPIONATE (QVAR INHA) Take 1 puff by inhalation once daily if needed.               Doxycycline 100 mg Capsule Take twice a day for 10 days and then once a day for 2 months.   take with 8 oz of water    ESTRACE 0.01 % (0.1 mg/gram) Vaginal Cream Insert into the vagina three times a week. (Patient taking differently: Insert 1 g into the vagina As Directed. Insert into the vagina three times a week.          )    Fluticasone (FLOVENT HFA) 110 mcg/actuation Inhaler Take 1-2 puffs by inhalation every 12 hours.    lactase (LACTAID FAST  ACT PO) Take 1 Caplet by mouth once daily if needed. With meal as needed              Ondansetron (ZOFRAN) 4 mg Tablet Take 1 tablet by mouth every 8 hours if needed.    PEG 3350-Electrolytes-Vit C (MOVIPREP) 100-7.5-2.691 gram Powder in Packet Take as directed on gi lab instruction sheet (may substitute with gavilyte-C)    Sennosides (SENOKOT) 8.6 mg Tablet Take 2 tablets by mouth every day at bedtime. (Patient taking differently: Take 2 tablets by mouth every day at bedtime.           )    Trazodone (DESYREL) 50 mg Tablet Take 1 tablet by mouth every day at bedtime.     No current facility-administered medications for this encounter.        Anticoagulants: Patient denies taking Aspirin, NSAIDs, or other anticoagulants for the past 5 days.    OTC/Herbal preparations: None    Reviewed health status with the patient in regards to allergies (including latex), medications, pregnancy, hypertension, atrial fibrilation, liver disease, bleeding disorders, diabetes,  CVA's, seizures, sleep apnea, glaucoma, cancer, prosthesis or implants, infectious diseases, surgical history; and disease of heart, lungs, liver, or kidneys.  Positive history reviewed and updated in Health History section of the EMR.    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    Elevated TSH    Tarlov cyst    Left leg pain    Pain and swelling of left lower leg    S/P lumbar fusion    Reactive depression       Allergies   Allergen Reactions    Epinephrine Tachycardia     per [place] (Sulfonamide Antibiotics) Anaphylaxis    Suture N/A     Patient states skin "goes to mush"       Past Medical History:   Diagnosis Date    Asthma 02/11/2016    Basal cell carcinoma of skin of nose 04/14/2016    Depression     Elevated TSH 10/20/2016    Mixed hypercholesterolemia and hypertriglyceridemia     Osteopenia 04/14/2016    Recurrent major depressive disorder, in partial remission ([place]) 04/14/2016    Severe osteoarthritis of left AC (acromioclavicular) joint 09/14/2016    Tear of left supraspinatus tendon 09/14/2016       Nursing Assessment Data Base:  Ventilation/Respirations: normal, unlabored.  Circulation/Perfusion/Skin: warm,normal color,dry.  Cognition/Communication -- Behavior: alert and oriented,cooperative.  Gastro-Intestinal: constipation, nausea.  Abdomen: soft,non-tender.  Level of Ambulation: self.    Belongings are with patient in garment bag under gurney.  Patient has: Glasses.  Barriers to Learning assessed: none. Patient verbalizes  understanding of teaching and instructions.    Nursing assessment completed by:  Leo Grosser, RN  10/11/2017 12:35

## 2017-10-11 NOTE — Progress Notes (Signed)
Chief Complaint   Patient presents with    Medication Problem     nausea       Subjective:  Erica Hancock is a 62yr old female who presents with the following chief complaint:     89yr female here for medication problem.    Reports nausea and dizziness over 2 week trial of Trazodone.  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  Restarted Amitriptyline 50 mg for depression and insomnia.  Feels like better but sometimes hit-or-miss with symptoms.      S/P lumbar fusion.  Established with Dr. Maudie Mercury, neurosurgery.  Reports continued right-sided sciatica pain with recent flare-up.  Rx Tramadol refill requested.  Needs to get a massage.    Screening for colon cancer.  Had colonoscopy yesterday.  Reports continued constipation.  Currently on Citrucel and Milk of Magnesia. Drinking Lactaid milk for bone health.  Stopped vitamins due to nausea.  Overall high fiber vegetarian diet.  Requesting GI referral for consultation.    Mild persistent asthma.  Restarted using Flovent with poor air quality.  Not improved with Qvar.        PATHOLOGY RESULTS ADDENDUM 10/16/2017    Colon cancer surveillance should be repeated in 5 years. and is reflected in the Health Maintainence section of the EMR.    Doralee Albino Bowlus, MD    FINAL DIAGNOSIS     A.COLON, SIGMOID, PEDUNCULATED POLYP (BIOPSY):  - Tubular adenoma    B.COLON, TRANSVERSE, SESSILE POLYP (BIOPSY):  - Sessile serrated adenoma/polyp         Social History     Tobacco Use    Smoking status: Former Smoker     Packs/day: 1.00     Years: 12.00     Pack years: 12.00     Types: Cigarettes     Last attempt to quit: 03/01/1979     Years since quitting: 38.6    Smokeless tobacco: Never Used   Substance Use Topics    Alcohol use: Yes     Alcohol/week: 6.0 standard drinks     Types: 6 Glasses of wine per week    Drug use:  Yes     Frequency: 2.0 times per week     Types: Marijuana     Comment: once daily oil       Patient Active Problem List   Diagnosis    Migraines    Insomnia    Arthritis of carpometacarpal (CMC) joint of right thumb    Asthma    HLD (hyperlipidemia)    Spondylolisthesis of lumbar region    Foraminal stenosis of lumbar region    Lumbar radicular pain    Calcific tendinitis of left shoulder    Pain in joint of left shoulder    Basal cell carcinoma of skin of nose    Basal cell carcinoma    Osteopenia    Recurrent major depressive disorder, in partial remission ([facility])    Mixed hypercholesterolemia and hypertriglyceridemia    Tear of left supraspinatus tendon    Severe osteoarthritis of left AC (acromioclavicular) joint    Elevated TSH    Tarlov cyst    Left leg pain    Pain and swelling of left lower leg    S/P lumbar fusion    Reactive depression     Current Outpatient Medications on File Prior to Visit   Medication Sig Dispense Refill  Albuterol (PROAIR HFA, PROVENTIL HFA, VENTOLIN HFA) 90 mcg/actuation inhaler Take 2 puffs by inhalation every 4 hours if needed. 1 inhaler 0    BECLOMETHASONE DIPROPIONATE (QVAR INHA) Take 1 puff by inhalation once daily if needed.                 Doxycycline 100 mg Capsule Take twice a day for 10 days and then once a day for 2 months.   take with 8 oz of water 130 capsule 0    Fluticasone (FLOVENT HFA) 110 mcg/actuation Inhaler Take 1-2 puffs by inhalation every 12 hours. 17 g 0    lactase (LACTAID FAST ACT PO) Take 1 Caplet by mouth once daily if needed. With meal as needed                Ondansetron (ZOFRAN) 4 mg Tablet Take 1 tablet by mouth every 8 hours if needed. 9 tablet 0    Sennosides (SENOKOT) 8.6 mg Tablet Take 2 tablets by mouth every day at bedtime. (Patient taking differently: Take 2 tablets by mouth every day at bedtime.           ) 60 tablet 0    Trazodone (DESYREL) 50 mg Tablet Take 1 tablet by mouth every day at bedtime. 30 tablet 0      No current facility-administered medications on file prior to visit.      BP 117/70 (SITE: right arm, Orthostatic Position: sitting, Cuff Size: regular)   Pulse 86   Temp 37.4 C (99.3 F) (Tympanic)   Wt 65.9 kg (145 lb 4.5 oz)   BMI 24.94 kg/m     OBJECTIVE:  General Appearance: healthy, alert, no distress, pleasant affect, cooperative.    ASSESSMENT AND PLAN AS DISCUSSED WITH THE PATIENT:      ICD-10-CM    1. Nausea R11.0    2. Dizziness R42    3. Anxiety F41.9    4. Chronic insomnia F51.04 Amitriptyline (ELAVIL) 50 mg Tablet   5. S/P lumbar fusion Z98.1 Tramadol (ULTRAM) 50 mg Tablet   6. Tubular adenoma D36.9 GASTROENTEROLOGY REFERRAL   7. Chronic constipation K59.09 GASTROENTEROLOGY REFERRAL   8. Mild persistent asthma without complication S34.19      Reviewed and discussed patient's medical concerns at length.    # Nausea / dizziness associated with Trazodone trial.  Improved with resuming Elavil for anxiety and chronic insomnia.  Discussed sleep hygiene and supportive care.  Recommended counseling.  Discussed stress relief, anxiety management, exercise, healthy diet and exercise, relaxation techniques, deep breathing exercises.    # S/P lumbar fusion per neurosurgery.  Tramadol Rx given - warned.    # Tubular adenoma with chronic constipation.  Colonoscopy report reviewed and discussed.  Referral to GI for further evaluation.    # Mild persistent asthma.  Stable without exacerbation.      More than 25 minutes were spent face to face with the patient during this visit. More than 50% of the visit was spent providing education and/or counseling to the patient (and the family if present) regarding the issues documented in this note.    Discussed care and warning signs with [name] and all questions and concerns were fully answered. She will call or followup in the office if any problems arise.     I have reviewed the patient's medical history in detail and updated the computerized patient  record.    Barriers to Learning assessed: none.   Patient verbalizes understanding of teaching and instructions.  The patient and/or caregiver was educated regarding his/her medical care.   No guarantees were made regarding his/her medical care or treatment outcome.    I reviewed past medical, family/social and medication history during the visit.     This note was created using the support of [employer] and/or Pharmacist, community. Please note any grammatical, sound-alike, or syntax errors as likely dictation errors.      Electronically signed by:    Otis Peak, DO  Board-Certified, American Board of Family Medicine  Associate Physician in [place]

## 2017-10-11 NOTE — Procedures (Addendum)
Pre-Procedure Time Out Checklist  (do not remove)     Attestation:  ID verified by two sources (select any two from list): DOB and Name  Was this an emergency procedure?  no     I attest that I verified the following information prior to performing the procedure: Patient ID, Site and Procedure     Procedure: Colonoscopy  Subprocedure: cold snare polypectomy and cold forceps polypectomy.    Date of Service: 10/11/2017    Time of Procedure  Sedations Start Time:  12:58  Scope In:   13:15  Cecum:   13:47  Scope Out:   14:10     I was the responsible provider supervising the administration of moderate sedation to this patient by a trained independent provider (RN). I was present during the interservice time from initial administration of sedatives until the end of the procedure at the time noted above.    Referring Physician: PCP: Harley Alto, DO    PERFORMING SURGEONS: Leonides Sake, Nyoka Lint    Indications: Screening for colon cancer    Details of the Procedure:    Informed consent was obtained prior to the procedure, with discussion of the risk, benefits and alternative options.  Risks of perforation, hemorrhage, adverse drug reaction, pain, infection, missed lesion, incomplete examination, and aspiration were discussed.  A timeout was performed by nursing and medical staff to identify the patient (using two patient identifiers) and appropriate procedure. The patient was placed in the left lateral decubitus position. The patient was monitored continuously with EKG tracing, pulse oximetry, blood pressure monitoring, and direct observations.      A rectal examination was performed.  The Olympus colonoscope was inserted into the rectum and advanced under direct visualization to the cecum, which was identified by the ileocecal valve.  The quality of the colonic preparation was good.  A careful inspection was made as the colonscope was withdrawn, including a retroflexed view of the rectum; findings and  interventions are described below.  Photo documentation was obtained.  The patient tolerated the procedure well, and there were no immediate complications.  The patient was taken to the recovery area in stable condition.      Sedation:  Demerol 100 mg IV and Versed 5.5 mg IV    Findings and Interventions:    Terminal Ileum:  Not examined   Cecum:   Normal   Ascending Colon:  Normal   Transverse Colon: 33mm sessile polyp removed by cold forceps   Descending Colon: Normal   Sigmoid Colon:  7-48mm pedunculated polyp removed with cold snare polypectomy   Rectum:   Normal    Impression:  1. Transverse colon with 57mm sessile polyp  2. Sigmoid colon with 7-22mm pedunculated polyp  3. Redundant colon    Recommendation/Plan:  1. Repeat colon cancer screening according to polyp histology and other risk factors for colon cancer.    Report Electronically Signed By:    Nyoka Lint, M.D.  Gastroenterology Fellow, PGY-4  [facility]  PI# [id number], Pager# [phone]       I was physically present during the entire procedure(s) indicated herein. I agree with the above procedure note, impression and recommendations that were formulated together with the GI fellow.    Report electronically signed by Leonides Sake, M.D., Professor, Division of Gastroenterology and Hepatology     ------------------------------------------------------------------------------------------------------------------  PATHOLOGY RESULTS ADDENDUM 10/16/2017    Colon cancer surveillance should be repeated in 5 years. and is reflected in the Health Maintainence  section of the EMR.    Melida Quitter, MD    FINAL DIAGNOSIS     A.        COLON, SIGMOID, PEDUNCULATED POLYP (BIOPSY):  -   Tubular adenoma             B.        COLON, TRANSVERSE, SESSILE POLYP (BIOPSY):  -   Sessile serrated adenoma/polyp

## 2017-10-11 NOTE — Nurse Focus (Signed)
Amount of time spent educating patient: 30 minutes    Education                 Title: GI Endoscopy (Adult) (Resolved)     Provider References:   CPGGI Endo [email or web address]: [email or web address]     Topic: When to Seek Medical Attention (Resolved)     Point: Sleepiness, Persistent (Resolved)     Description:   Provider (specify) should be called if:       continued sedation effects the next day    symptoms do not resolve    Immediate medical care should be sought if:    difficult to arouse/change in level of consciousness    symptoms become worse           Patient Friendly Description:   Call the doctor if:           you have a hard time waking up the next day        symptoms do not get better    Learning Progress Summary           Patient Acceptance, E, VU by CB at 10/11/2017 1237                   Point: Nausea and Vomiting (Resolved)     Description:   Provider (specify) should be called if:       unable to tolerate food, drink or medication    loss of appetite    symptoms do not resolve    Immediate medical care should be sought if:    forceful vomiting    blood in stool or vomit    symptoms become worse           Patient Friendly Description:   Do not force yourself to eat, but try to sip some clear liquids.           Call the doctor if:        you can't keep food or drink down without getting sick        you don't have an appetite        you do not feel better        Get medical care right away if:        you see blood in your poop (stool) or vomit        you feel worse    Learning Progress Summary           Patient Acceptance, E, VU by CB at 10/11/2017 1237                         Topic: Gastrointestinal Endoscopy Overview (Resolved)     Point: Procedure Review (Resolved)     Description:   Gastrointestinal (GI) endoscopy is a procedure to examine the lining of the esophagus, stomach, upper small intestine, colon or rectum.       A flexible, lighted  fiber optic scope is used.    Review anticipated anesthesia type.    Post-procedure care will include:    monitoring during sedation recovery    IV fluid    gradual diet advancement           Patient Friendly Description:   A gastrointestinal (GI) endoscopy is a long, flexible tube.           It looks at the lining of parts of your digestive system.  It is put down the throat or in your bottom.        A light and a tiny camera is at the end of the scope. Other special tools can be used through a hole in the scope.        Medicine will help make you sleepy and comfortable.    Learning Progress Summary           Patient Acceptance, E, VU by CB at 10/11/2017 1237                   Point: Indications (Resolved)     Description:   Indications for a GI endoscopy may include:       biopsy    difficulty swallowing    gastric or esophageal ulcer    GI bleeding    unexplained anemia    persistent heartburn    persistent vomiting    persistent reflux symptoms despite therapy    cancer screening    abnormal radiologic finding    foreign body removal    diagnostic for inflammatory bowel disease    Focus education on patient-specific indication.           Patient Friendly Description:   There are many reasons to have this procedure.           The doctor will talk to you about why it is important for you.        Please ask questions if you do not understand.    Learning Progress Summary           Patient Acceptance, E, VU by CB at 10/11/2017 1237                         Topic: Self-Management (Resolved)     Point: Fluid/Food Intake (Resolved)     Description:   Encourage advancing diet gradually once swallowing has returned to normal and patient is alert.              Learning Progress Summary           Patient Acceptance, E, VU by CB at 10/11/2017 1237                   Point: Activity (Resolved)     Description:   Review post-discharge activity restrictions which may include:       driving    work/school    Someone should be  with the patient to make sure he/she is safe until fully recovered from sedation.           Patient Friendly Description:   Follow the doctor's instructions for activity.           Rest for today.  Do not try to do too much.        There may be limits set for:        work, school        playing sports, exercise        driving as applicable        use of tools or appliances        signing of legal papers        Someone should stay with you until you are safe or have completely recovered from sedation.    Learning Progress Summary           Patient Acceptance, E, VU by CB at 10/11/2017 1237  Point: Resuming Home Medication (Resolved)     Description:   Resume regular medications as directed by provider after procedure.       Patient may need to hold anticoagulants, sedatives and narcotics for a specified amount of time.           Learning Progress Summary           Patient Acceptance, E, VU by CB at 10/11/2017 1237                   Point: Promote Flatus Passing (Resolved)     Description:   Encourage gas expulsion and alternating positions to facilitate.              Patient Friendly Description:   Air was used to help see better during the procedure.           Extra air can give your child/you stomach pain after the procedure.        It will be important to pass gas and burp the extra air out. Walking and changing position helps move air up or down.        Right side-lying and fetal position (knees to the chest) might help with comfort.    Learning Progress Summary           Patient Acceptance, E, VU by CB at 10/11/2017 1237                               User [place] Effective Dates Name Provider Type Discipline    CB 07/06/17 Vernard Gambles, RN .NURSE: (RN or LVN) Nursing

## 2017-10-11 NOTE — Discharge Instructions (Signed)
Discharge Instructions for colonoscopy    Findings:   colonoscopy polyp(s)    Activity:    Rest today, resume usual activitiy tomorrow     Diet:    Resume usual diet:     Medication:    Resume all usual medications    Follow up:   We will notify you of your biopsy results via phone call or letter.If you do not receive notice of your results by three weeks, please call us at the phone number provided below.    During your procedure, air was pumped into your GI tract so your doctor could see clearly to make a diagnosis and/or treat your problem.     Some possible side effects you may experience are:   - Discomfort due to a distended (bloated) abdomen which will subside after a few hours to two days.   - Nausea may be a side effect of the medication and will subside.   - The medications you received may make you dizzy and sleepy, it is important that you do not drive, operate machinery or drink alcohol for at least one day.   - Severe pain is not expected and should be reported    Other side effects may include:  Flex. Sig. Or Colonoscopy: A small amount of diarrhea may follow the exam.    If problems, call the [facility] at [phone] during business hours Monday through Friday 7:30am to 4:30 pm.  After hours call [phone] and ask to speak to the GI Fellow on call.

## 2017-10-12 ENCOUNTER — Ambulatory Visit: Admitting: Family Medicine

## 2017-10-12 ENCOUNTER — Encounter: Payer: Self-pay | Admitting: Family Medicine

## 2017-10-12 VITALS — BP 117/70 | HR 86 | Temp 99.3°F | Wt 145.3 lb

## 2017-10-12 DIAGNOSIS — Z981 Arthrodesis status: Secondary | ICD-10-CM

## 2017-10-12 DIAGNOSIS — D369 Benign neoplasm, unspecified site: Secondary | ICD-10-CM

## 2017-10-12 DIAGNOSIS — J4530 Mild persistent asthma, uncomplicated: Secondary | ICD-10-CM

## 2017-10-12 DIAGNOSIS — F5104 Psychophysiologic insomnia: Secondary | ICD-10-CM

## 2017-10-12 DIAGNOSIS — F419 Anxiety disorder, unspecified: Secondary | ICD-10-CM

## 2017-10-12 DIAGNOSIS — R110 Nausea: Principal | ICD-10-CM

## 2017-10-12 DIAGNOSIS — K5909 Other constipation: Secondary | ICD-10-CM

## 2017-10-12 DIAGNOSIS — R42 Dizziness and giddiness: Secondary | ICD-10-CM

## 2017-10-12 MED ORDER — TRAMADOL 50 MG TABLET
50.0000 mg | ORAL_TABLET | Freq: Four times a day (QID) | ORAL | 0 refills | Status: AC | PRN
Start: 2017-10-12 — End: 2017-11-12

## 2017-10-12 MED ORDER — ESTRACE 0.01% (0.1 MG/GRAM) VAGINAL CREAM
TOPICAL_CREAM | VAGINAL | 3 refills | Status: DC
Start: 2017-10-12 — End: 2018-12-07

## 2017-10-12 MED ORDER — AMITRIPTYLINE 50 MG TABLET
50.0000 mg | ORAL_TABLET | Freq: Every day | ORAL | 1 refills | Status: DC
Start: 2017-10-12 — End: 2018-03-23

## 2017-10-12 NOTE — Nursing Note (Signed)
Vital signs taken, allergies verified, screened for pain, tobacco hx verified, pharmacy verified.  Savalas Monje MA

## 2017-10-13 LAB — SURGICAL PATHOLOGY

## 2017-11-01 ENCOUNTER — Encounter: Payer: Self-pay | Admitting: Family Medicine

## 2017-11-17 ENCOUNTER — Other Ambulatory Visit: Payer: Self-pay | Admitting: Physician Assistant

## 2017-11-17 DIAGNOSIS — M48061 Spinal stenosis, lumbar region without neurogenic claudication: Principal | ICD-10-CM

## 2017-11-20 ENCOUNTER — Ambulatory Visit (HOSPITAL_BASED_OUTPATIENT_CLINIC_OR_DEPARTMENT_OTHER): Admitting: Neurological Surgery

## 2017-11-20 ENCOUNTER — Ambulatory Visit
Admission: RE | Admit: 2017-11-20 | Discharge: 2017-11-20 | Disposition: A | Discharge status: Home / Self Care | Source: Ambulatory Visit | Attending: Neurological Surgery | Admitting: Neurological Surgery

## 2017-11-20 ENCOUNTER — Encounter: Payer: Self-pay | Admitting: Neurological Surgery

## 2017-11-20 VITALS — BP 112/73 | HR 74 | Temp 97.9°F | Resp 18 | Ht 63.0 in | Wt 145.7 lb

## 2017-11-20 DIAGNOSIS — M79651 Pain in right thigh: Secondary | ICD-10-CM

## 2017-11-20 DIAGNOSIS — Z9889 Other specified postprocedural states: Secondary | ICD-10-CM

## 2017-11-20 DIAGNOSIS — Z981 Arthrodesis status: Principal | ICD-10-CM

## 2017-11-20 DIAGNOSIS — M48061 Spinal stenosis, lumbar region without neurogenic claudication: Secondary | ICD-10-CM

## 2017-11-20 DIAGNOSIS — M9983 Other biomechanical lesions of lumbar region: Principal | ICD-10-CM | POA: Insufficient documentation

## 2017-11-20 DIAGNOSIS — M7918 Myalgia, other site: Secondary | ICD-10-CM

## 2017-11-20 NOTE — Nursing Note (Signed)
Patient was verified with 2 identifiers, vital signs taken, allergies verified, and patient was screened for pain.  Patient is roomed in Rm.2.    Shanda Bumps, [facility]  Pulmonary Specialty Clinic  [phone]

## 2017-11-20 NOTE — Patient Instructions (Addendum)
Recommend starting some physical therapy for core strengthening, increasing CV endurance and some piriformis stretching. Direct to [facility], attn Ileene Rubens    Follow up in 1 year

## 2017-11-20 NOTE — Progress Notes (Signed)
SPINE CENTER  [address], [address] [place], [place]    Post-op Check: 10 months  Date of service: 11/20/2017    Erica Hancock is a 62yr-old female seen in the Neurosurgery Spine Clinic, status post the following sugery on 01/26/17: L4 laminectomy, bilateral L3-4 and L4-5 foraminotomies, L4 to L5 arthrodesis/instrumentation performed for a diagnosis of L4-5 degenerative spondylolisthesis and spinal stenosis with radiculopathy.    Pre-operative symptoms include the following: low back pain radiating down her legs bilaterally to the ankles that is also occasionally associated with numbness.       She states improvement in back pain, really doesn't notice much at all. Resolution of bilateral leg numbness. She has resolution of left anterolateral ankle pain. Does still have some right buttock and posterior thigh pain with prolonged sitting that has been bothering her over the past few weeks.  Does use heating pad and is scheduled for a massage.  Does still have some trouble sleeping. Takes Tylenol PRN.  Patient denies bowel/bladder dysfunction.    Function:  She does not have any limitations in activities of daily living.  She does not use adaptive equipment.    Review of systems  All other systems were reviewed and are negative, except as listed above in the HPI.     CURRENT MEDICATIONS:   Outpatient Medications Marked as Taking for the 11/20/17 encounter (Office Visit) with Terrall Laity, MD   Medication Sig Dispense Refill    Acetaminophen (TYLENOL) 325 mg Tablet Take 325 mg by mouth if needed.         Allergies   Allergen Reactions    Epinephrine Tachycardia     per [place] (Sulfonamide Antibiotics) Anaphylaxis    Suture N/A     Patient states skin "goes to mush"       PHYSICAL EXAMINATION:  BP 112/73 (SITE: left arm, Orthostatic Position: sitting, Cuff Size: regular)   Pulse 74   Temp 36.6 C (97.9 F) (Oral)   Resp 18   Ht 1.6 m (5\' 3" )   Wt 66.1 kg (145 lb 11.6 oz)    BMI 25.81 kg/m       Musculoskeletal/Neurological:   GCS 15(E 4 V 5 M 6).   Gait: Steady    Neuro Exam:  PERL 56mm bilaterally.  Mental Status: Awake, alert, oriented x 3.  Answering appropriately in complete sentences.  Use of language:  Fluent    Motor:   UPPER LIMB  Sensation intact throughout the bilateral upper limbs.    LOWER LIMB  Sensation intact throughout the bilateral lower limbs.  DTR 2+ at the bilateral patellae and Achilles.   Bilateral plantar flexor response is downgoing.     Strength Movement Root Nerve Muscle   Bilateral     5/5   Hip flexion L1/2/3 Femoral/spinal Iliopsoas   Bilateral     5/5   Knee flexion L5, S1/2 Sciatic hamstrings   Bilateral     5/5   Knee extension L2/3/4 Femoral Quadriceps   Bilateral    5/5   Dorsiflexion L4/5 Deep peroneal Tibialis anterior   Bilateral    5/5   Big toe extension L5/S1 Deep peroneal EHL   Bilateral     5/5   Plantarflexion S1/S2 Tibial Gastrocnemius  soleus     Incision: well approximated, no erythema or drainage.    RADIOLOGY:  XR lumbar 11/20/17 - No change in hardware position or alignment status post posterior  spinal fusion L4-L5. No pathologic motion.      IMPRESSION/RECOMMENDATIONS:   Patient was seen and evaluated by Dr. Charlann Noss who reviewed imaging, exam and symptoms with the patient. All questions were answered. Briggette Najarian is a 62yr -old female who is 10 months s/p L4 laminectomy, bilateral L3-4 and L4-5 foraminotomies, L4 to L5 arthrodesis/instrumentation performed for a diagnosis of L4-5 degenerative spondylolisthesis and spinal stenosis with radiculopathy. She has had resolution of pre-operative back pain and leg numbness. She has had significant improvement of leg pain with only recent flare of right gluteal and posterior thigh pain for which she has not tried much conservative therapies for. Patient has stable neurological exam and has done extremely well post-operatively.    Plan:  o Referrals -  Physical therapy, referral to Dr. Ileene Rubens at [facility] physical therapy   o Follow up -  1 year with XR 4 view    We spent 15 minutes and more than 50% of the time was spent on counseling and discussion of our findings and proposed treatments. Patient verbalized understanding of the above and are in agreement of the plan.  All questions and concerns were addressed.  She was advised to call clinic should there be any worsening of symptoms, further questions or concerns.    Ivin Poot, FNP-C  [place]    Attending Physician: Charlann Noss, MD  Neurological Surgery Department

## 2017-11-30 ENCOUNTER — Other Ambulatory Visit: Payer: Self-pay | Admitting: Family Medicine

## 2017-11-30 DIAGNOSIS — M7918 Myalgia, other site: Secondary | ICD-10-CM

## 2017-11-30 DIAGNOSIS — Z981 Arthrodesis status: Principal | ICD-10-CM

## 2017-11-30 NOTE — Progress Notes (Signed)
Dear Dr. Ronette Deter,    After her surgery, leg pain has resolved but has some residual back pain.  We have referred her for PT.  Thank you for allowing me to participate in her care.  I personally saw the patient and reviewed all relevant history and pysical examination with Ivin Poot, FNP-C.  I went over the imaging studies with the patient.  Based on the history including any treatment already tried and examination with relevant findings on the images, I have come with the recommended treatment plan.

## 2017-12-12 ENCOUNTER — Ambulatory Visit

## 2017-12-12 DIAGNOSIS — Z23 Encounter for immunization: Secondary | ICD-10-CM

## 2017-12-12 NOTE — Progress Notes (Signed)
The Influenza Vaccine VIS document for the flu injection was given to patient to review. Patient or person named in permission has answered no to any history of egg allergy, previous serious reaction to a influenza vaccine or current illness which would preclude them receiving an immunization. Any questions were referred to the physician. The Influenza Vaccine was then administered per protocol or by Policy 2041 using the standing order from Dr. James Kirk (Academic) or Dr. Kurt Slapnik (Network).   The patient was observed for immediate reactions to the vaccine per protocol. None were observed.   Karlye Ihrig, LVN

## 2018-01-17 ENCOUNTER — Ambulatory Visit: Admitting: Internal Medicine

## 2018-01-17 ENCOUNTER — Ambulatory Visit
Admission: RE | Admit: 2018-01-17 | Discharge: 2018-01-17 | Disposition: A | Discharge status: Home / Self Care | Source: Ambulatory Visit | Attending: Internal Medicine | Admitting: Internal Medicine

## 2018-01-17 ENCOUNTER — Encounter: Payer: Self-pay | Admitting: Internal Medicine

## 2018-01-17 ENCOUNTER — Other Ambulatory Visit: Payer: Self-pay | Admitting: Internal Medicine

## 2018-01-17 VITALS — BP 128/81 | HR 94 | Temp 98.1°F | Resp 16 | Ht 64.0 in | Wt 148.8 lb

## 2018-01-17 DIAGNOSIS — M25551 Pain in right hip: Principal | ICD-10-CM

## 2018-01-17 DIAGNOSIS — M6790 Unspecified disorder of synovium and tendon, unspecified site: Principal | ICD-10-CM

## 2018-01-17 DIAGNOSIS — M67959 Unspecified disorder of synovium and tendon, unspecified thigh: Secondary | ICD-10-CM

## 2018-01-17 NOTE — Nursing Note (Signed)
Vitals signs were taken, screened for pain, pharmacy and allergies verified.      Tyland Klemens, MA

## 2018-01-17 NOTE — Patient Instructions (Signed)
Piriformis Syndrome: Exercises  Your Care Instructions  Here are some examples of typical rehabilitation exercises for your condition. Start each exercise slowly. Ease off the exercise if you start to have pain.  Your doctor or physical therapist will tell you when you can start these exercises and which ones will work best for you.  How to do the exercises  Hip rotator stretch    1. Lie on your back with both knees bent and your feet flat on the floor.  2. Put the ankle of your affected leg on your opposite thigh near your knee.  3. Use your hand to gently push your knee (on your affected leg) away from your body until you feel a gentle stretch around your hip.  4. Hold the stretch for 15 to 30 seconds.  5. Repeat 2 to 4 times.  6. Switch legs and repeat steps 1 through 5.  Piriformis stretch    1. Lie on your back with your legs straight.  2. Lift your affected leg and bend your knee. With your opposite hand, reach across your body, and then gently pull your knee toward your opposite shoulder.  3. Hold the stretch for 15 to 30 seconds.  4. Repeat with your other leg.  5. Repeat 2 to 4 times on each side.  Lower abdominal strengthening    1. Lie on your back with your knees bent and your feet flat on the floor.  2. Tighten your belly muscles by pulling your belly button in toward your spine.  3. Lift one foot off the floor and bring your knee toward your chest, so that your knee is straight above your hip and your leg is bent like the letter "L."  4. Lift the other knee up to the same position.  5. Lower one leg at a time to the starting position.  6. Keep alternating legs until you have lifted each leg 8 to 12 times.  7. Be sure to keep your belly muscles tight and your back still as you are moving your legs. Be sure to breathe normally.  Follow-up care is a key part of your treatment and safety. Be sure to make and go to all appointments, and call your doctor if you are having problems. It's also a good idea to  know your test results and keep a list of the medicines you take.   Where can you learn more?   Go to https://www.healthwise.net/patiented  Enter W928 in the search box to learn more about "Piriformis Syndrome: Exercises."    2006-2015 [employer]. Care instructions adapted under license by [facility]. This care instruction is for use with your licensed healthcare professional. If you have questions about a medical condition or this instruction, always ask your healthcare professional. Healthwise, Incorporated disclaims any warranty or liability for your use of this information.  Content Version: 10.6.465758; Current as of: Jul 19, 2013

## 2018-01-17 NOTE — Progress Notes (Signed)
[place] COMPLAINT     Chief Complaint   Patient presents with    Hip Pain       HISTORY     Erica Hancock is a 62yr old female who presents to clinic for the following:    1. Right hip pain.  Moved into new house about 1 week ago and did a lot more walking/lifting than she usually does.  Has barely been able to walk.  Has been taking tramadol 5 days in a row, and that is usually not like her.  Now taking motrin as well.  Has history of osteopenia.  Hx of spinal stenosis s/p laminectomy in lumbar spine last year.    REVIEW OF SYSTEMS     Review of Systems   Genitourinary: Negative for dysuria.   Musculoskeletal: Positive for joint swelling (says R hip is always swollen).   Neurological: Negative for numbness.       SOCIAL HISTORY     Social History     Tobacco Use    Smoking status: Former Smoker     Packs/day: 1.00     Years: 12.00     Pack years: 12.00     Types: Cigarettes     Last attempt to quit: 03/01/1979     Years since quitting: 38.9    Smokeless tobacco: Never Used   Substance Use Topics    Alcohol use: Yes     Alcohol/week: 6.0 standard drinks     Types: 6 Glasses of wine per week    Drug use: Yes     Frequency: 2.0 times per week     Types: Marijuana     Comment: once daily oil       PROBLEM LIST     Patient Active Problem List   Diagnosis    Migraines    Insomnia    Arthritis of carpometacarpal (CMC) joint of right thumb    Asthma    HLD (hyperlipidemia)    Spondylolisthesis of lumbar region    Foraminal stenosis of lumbar region    Lumbar radicular pain    Calcific tendinitis of left shoulder    Pain in joint of left shoulder    Basal cell carcinoma of skin of nose    Basal cell carcinoma    Osteopenia    Recurrent major depressive disorder, in partial remission ([place])    Mixed hypercholesterolemia and hypertriglyceridemia    Tear of left supraspinatus tendon    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      Albuterol (PROAIR HFA, PROVENTIL HFA, VENTOLIN HFA) 90 mcg/actuation inhaler Take 2 puffs by inhalation every 4 hours if needed. 1 inhaler 0    Amitriptyline (ELAVIL) 50 mg Tablet Take 1 tablet by mouth every day at bedtime. Indications: depression 90 tablet 1    BECLOMETHASONE DIPROPIONATE (QVAR INHA) Take 1 puff by inhalation once daily if needed.                 ESTRACE 0.01 % (  0.1 mg/gram) Vaginal Cream Insert 1 g into the vagina three times a week. 42.5 g 3    Fluticasone (FLOVENT HFA) 110 mcg/actuation Inhaler Take 1-2 puffs by inhalation every 12 hours. 17 g 0    lactase (LACTAID FAST ACT PO) Take 1 Caplet by mouth once daily if needed. With meal as needed                Ondansetron (ZOFRAN) 4 mg Tablet Take 1 tablet by mouth every 8 hours if needed. 9 tablet 0    Sennosides (SENOKOT) 8.6 mg Tablet Take 2 tablets by mouth every day at bedtime. (Patient taking differently: Take 2 tablets by mouth every day at bedtime.           ) 60 tablet 0    Trazodone (DESYREL) 50 mg Tablet Take 1 tablet by mouth every day at bedtime. 30 tablet 0     No current facility-administered medications on file prior to visit.        VITAL SIGNS    height is 1.626 m (5\' 4" ) and weight is 67.5 kg (148 lb 13 oz). Her temporal temperature is 36.7 C (98.1 F). Her blood pressure is 128/81 and her pulse is 94. Her respiration is 16 and oxygen saturation is 98%.     PHYSICAL     Physical Exam  Constitutional:       Appearance: Normal appearance. She is not ill-appearing.   Musculoskeletal:      Comments: Right hip: No tenderness to palpation over greater trochanter or IT band.  Full passive ROM without significant pain.  No tenderness to  palpation in anterior/pubic region.  +notable tenderness near piriformis   Skin:     General: Skin is warm and dry.   Neurological:      Mental Status: She is alert.          ASSESSMENT AND PLAN     1. Tendinopathy involving hip  2. Right hip pain  - possible piriformis syndrome component  - hx of osteopenia--patient worried about underlying fracture--will get hip x-ray  - PHYSICAL THERAPY REFERRAL  - provided patient with stretches to perform at home for piriformis  - ibuprofen OTC 800mg  TID prn (try consistently for 1 week)  - heat therapy  - says she will f/u with PCP in 1 week (already has appt scheduled)    Orders Placed This Encounter    PHYSICAL THERAPY REFERRAL     Clinical Indication(s):  Right hip tendinopathy    Service requested:  Physical Therapy  Functional Goals: maximize functional status and decrease pain  Frequency of Therapy: TBD by therapist  Precautions and complicating illnesses: None    I certify that I have examined the patient and therapy is necessary and that service will be furnished while the patient is under my care, and that the plan will be established and will be reviewed every 30 days or more often if the patient's condition requires.           ------------------  Discussed care and warning signs with Erica Hancock and all questions and concerns were fully answered. She will call or followup in the office if any problems arise.    Barriers to Learning assessed: none. Patient verbalizes understanding of teaching and instructions.    The patient and/or caregiver was educated regarding his/her medical care. No guarantees were made regarding medical care or treatment outcome.    Electronically signed by:        Suezanne Jacquet  Norlene Duel, DO  Associate Physician  Internal Medicine  [facility], [place]

## 2018-01-18 NOTE — Progress Notes (Signed)
Chief Complaint   Patient presents with    Hip Pain     f/u right hip x-ray    Back Pain     f/u       Subjective:  Erica Hancock is a 62yr old female who presents with the following chief complaint:     30yr female with right hip pain follow-up.    Unable to sleep on right hip due to deep pain - improved with use of two mattress toppers now.  Developed acute pain in past 2 weeks.  Recent move from home to RV on November 15th.  Took week off physical therapy to move.  Spent 8 hours daily on her feet without doing home stretches.  Leaving on November 30th for 3 month RV travel.  Plans to perform yoga, use exercise ball and therabands.  Pain worse with walking downstairs.  Currently on Ibuprofen 800 mg consistently for next week.  Recently received a hot stone massage too.  Of significance, had lumbar spinal fusion surgery 1 year ago.  Cleared for physical therapy and return to activities.  Requesting DMV extension for handicapped placard.    Tests including laboratory and/or radiographic studies independently reviewed by me during the visit with Erica Hancock:    Hip 1 View, Right + AP Pelvis (01/17/2018):    FINDINGS:  Hip joints are preserved. Mild degenerative changes of the sacroiliac joints noted. There are calcifications adjacent and superior to the right greater trochanter in 2 areas. This likely represents calcific tendinitis, likely within the gluteus minimus and medius tendons a smaller similar calcifications noted adjacent to the greater trochanter on the left. Spinal fixation rods with pedicle screws noted at L4-5.    IMPRESSION:  1. No abnormalities of the hips. Probable calcific tendinitis right greater than left greater trochanters.    Social History     Tobacco Use    Smoking status: Former Smoker     Packs/day: 1.00     Years: 12.00     Pack years: 12.00     Types: Cigarettes     Last attempt to quit: 03/01/1979     Years since quitting: 38.9    Smokeless tobacco: Never Used   Substance Use  Topics    Alcohol use: Yes     Alcohol/week: 6.0 standard drinks     Types: 6 Glasses of wine per week    Drug use: Yes     Frequency: 2.0 times per week     Types: Marijuana     Comment: once daily oil       Patient Active Problem List   Diagnosis    Migraines    Insomnia    Arthritis of carpometacarpal (CMC) joint of right thumb    Asthma    HLD (hyperlipidemia)    Spondylolisthesis of lumbar region    Foraminal stenosis of lumbar region    Lumbar radicular pain    Calcific tendinitis of left shoulder    Pain in joint of left shoulder    Basal cell carcinoma of skin of nose    Basal cell carcinoma    Osteopenia    Recurrent major depressive disorder, in partial remission ([place])    Mixed hypercholesterolemia and hypertriglyceridemia    Tear of left supraspinatus tendon    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      Albuterol (PROAIR HFA, PROVENTIL HFA, VENTOLIN HFA) 90 mcg/actuation inhaler Take 2 puffs by inhalation every 4 hours if needed. 1 inhaler 0    Amitriptyline (ELAVIL) 50 mg Tablet Take 1 tablet by mouth every day at bedtime. Indications: depression 90 tablet 1    BECLOMETHASONE DIPROPIONATE (QVAR INHA) Take 1 puff by inhalation once daily if needed.                 ESTRACE 0.01 % (0.1 mg/gram) Vaginal Cream Insert 1 g into the vagina three times a week. 42.5 g 3    Fluticasone (FLOVENT HFA) 110 mcg/actuation Inhaler Take 1-2 puffs by inhalation every 12 hours. 17 g 0    lactase (LACTAID FAST ACT PO) Take 1 Caplet by mouth once daily if needed. With meal as needed                Ondansetron (ZOFRAN) 4 mg Tablet Take 1 tablet by mouth every 8 hours if needed. 9  tablet 0    Sennosides (SENOKOT) 8.6 mg Tablet Take 2 tablets by mouth every day at bedtime. (Patient taking differently: Take 2 tablets by mouth every day at bedtime.           ) 60 tablet 0    Trazodone (DESYREL) 50 mg Tablet Take 1 tablet by mouth every day at bedtime. 30 tablet 0     No current facility-administered medications on file prior to visit.      Wt 67 kg (147 lb 11.3 oz)   BMI 25.35 kg/m     OBJECTIVE:  General Appearance: healthy, alert, no distress, pleasant affect, cooperative.  Musculoskeletal: right hip: tenderness to palpation near piriformis muscle.  No tenderness to palpation over greater trochanter or IT band.  FROM without significant pain.      ASSESSMENT AND PLAN AS DISCUSSED WITH THE PATIENT:      ICD-10-CM    1. Tendinopathy involving hip M67.90 Ibuprofen (MOTRIN) 800 mg Tablet   2. Right hip pain M25.551 Ibuprofen (MOTRIN) 800 mg Tablet     Discussed diagnosis, course, treatment, management.  Discussed pertinent anatomy and physiology at length with patient.  Reviewed imaging results at length.  Recommended completion of physical therapy.  Rx Ibuprofen 800 mg TID given.  Warned.  Discussed supportive care, ice / heat, home exercises, analgesia as needed.  Discussed care and warning signs with Erica Hancock and all questions and concerns were fully answered. She will call or followup in the office if any problems arise.  DMV form completed.    I have reviewed the patient's medical history in detail and updated the computerized patient record.    Barriers to Learning assessed: none.   Patient verbalizes understanding of teaching and instructions.    The patient and/or caregiver was educated regarding his/her medical care.   No guarantees were made regarding his/her medical care or treatment outcome.    I reviewed past medical, family/social and medication history during the visit.     This note was created using the support of [employer] and/or Pharmacist, community. Please note any  grammatical, sound-alike, or syntax errors as likely dictation errors.      Electronically signed by:    Otis Peak,  DO  Board-Certified, American Board of Family Medicine  Associate Physician in [place]

## 2018-01-19 ENCOUNTER — Ambulatory Visit: Admitting: Family Medicine

## 2018-01-19 ENCOUNTER — Encounter: Payer: Self-pay | Admitting: Family Medicine

## 2018-01-19 VITALS — Wt 147.7 lb

## 2018-01-19 DIAGNOSIS — M25551 Pain in right hip: Secondary | ICD-10-CM

## 2018-01-19 DIAGNOSIS — M6790 Unspecified disorder of synovium and tendon, unspecified site: Principal | ICD-10-CM

## 2018-01-19 DIAGNOSIS — M67959 Unspecified disorder of synovium and tendon, unspecified thigh: Secondary | ICD-10-CM

## 2018-01-19 MED ORDER — IBUPROFEN 800 MG TABLET
800.0000 mg | ORAL_TABLET | Freq: Three times a day (TID) | ORAL | 0 refills | Status: AC | PRN
Start: 2018-01-19 — End: 2019-01-19

## 2018-01-19 NOTE — Nursing Note (Signed)
Patient roomed, chief complaint noted, allergies verified, blood pressure, pulse, respiration, and weight obtained, screened for pain, and pharmacy verified.  Jenni Thew, M.A.

## 2018-03-23 ENCOUNTER — Telehealth: Payer: Self-pay | Admitting: Internal Medicine

## 2018-03-23 DIAGNOSIS — F5104 Psychophysiologic insomnia: Principal | ICD-10-CM

## 2018-03-23 DIAGNOSIS — J45909 Unspecified asthma, uncomplicated: Secondary | ICD-10-CM

## 2018-03-23 MED ORDER — ALBUTEROL SULFATE HFA 90 MCG/ACTUATION AEROSOL INHALER
2.0000 | INHALATION_SPRAY | RESPIRATORY_TRACT | 0 refills | Status: AC | PRN
Start: 2018-03-23 — End: 2018-09-22

## 2018-03-23 MED ORDER — AMITRIPTYLINE 50 MG TABLET
50.0000 mg | ORAL_TABLET | Freq: Every day | ORAL | 0 refills | Status: DC
Start: 2018-03-23 — End: 2018-04-17

## 2018-03-23 NOTE — Telephone Encounter (Signed)
Referral Request:    Patient is calling to request new referral to physical therapy in [place] regarding a problem of Tendinopathy involving hip . She requests a referral her insurance is [employer].     [MOSC:  Review if referral already exists. If so, let the patient know the status of the referral. MA:  Has patient been seen for this problem?  If yes, route to provider.  If not, request they make an appointment.]    General Requests:    Requesting a refill of Amitriptyline (ELAVIL).  The patient states that the dose is 50mg   and the instructions are take 1 tablet by mouth daily.  The patient requests Mail order Rx- mail to patient.    Requesting a refill of Albuterol (PROAIR HFA, PROVENTIL HFA, VENTOLIN HFA).  The patient states that the dose is 54mcg  and the instructions are take 2 puffs by inhalation.  The patient requests Mail order Rx- mail to patient.    [MOSC:  Please inform that all medication requests should be made 3 business days in advance.  Advise patient once requested to allow for 48-72 business hours for processing.  Verify that the prescription was not already called in.  Route to MA pool.  MA:  Sort medication history list by date, select the last prescribed version of the medication. Pend refill and route to Provider.  If message is URGENT, MA is to notify Provider face to face about the request].

## 2018-03-26 NOTE — Telephone Encounter (Signed)
Called, Spoke to patient and used 3 patient identifiers. Relayed messaged below from Dr. Norlene Duel. Pt will call back with information on her PT referral.

## 2018-04-17 ENCOUNTER — Other Ambulatory Visit: Payer: Self-pay | Admitting: Internal Medicine

## 2018-04-17 DIAGNOSIS — F5104 Psychophysiologic insomnia: Principal | ICD-10-CM

## 2018-04-17 MED ORDER — AMITRIPTYLINE 50 MG TABLET
50.0000 mg | ORAL_TABLET | Freq: Every day | ORAL | 0 refills | Status: DC
Start: 2018-04-17 — End: 2018-08-16

## 2018-08-03 ENCOUNTER — Telehealth: Payer: Self-pay | Admitting: Internal Medicine

## 2018-08-03 NOTE — Telephone Encounter (Addendum)
Erica Hancock is a 63yr old female  3 patient identifiers used.  Per:   patient.      ClearTriage Note: About 2 weeks ago Left foot pain on the instep.  Went to urgent care last week, x-rays shows no evidence of fracture,  patient can still walk and wear shoes.  She did get a walking shoe from urgent.  Patient with pain and swelling in Left foot and ankle.  Patient is currently driving in an RV from [place] to [place].    Protocol Used: Foot Pain (Adult)  Protocol-Based Disposition: See PCP within 3 Days  Override (Final) Disposition: See PCP within 2 Weeks  Override Reason: Caller refused suggested disposition    Positive Triage Questions:  * [1] MODERATE pain (e.g., interferes with normal activities, limping) AND [2] present > 3 days  * [1] Numbness or tingling in feet AND [2] new or increased    Negative Triage Questions:  * Entire foot is cool or blue in comparison to other foot  * Purple or black skin on foot or toe  * [1] Red area or streak AND [2] fever  * [1] Swollen foot AND [2] fever  * Patient sounds very sick or weak to the triager  * [1] SEVERE pain (e.g., excruciating, unable to do any normal activities) AND [2] not improved after 2 hours of pain medicine  * [1] Looks infected (spreading redness, pus) AND [2] large red area (> 2 in. or 5 cm)  * Looks like a boil, infected sore, or deep ulcer  * [1] Redness of the skin AND [2] no fever  * [1] Swollen foot AND [2] no fever  (Exceptions: localized bump from bunions, calluses, insect bite, sting)  * Numbness in one foot (i.e., loss of sensation)  * Pain in the big toe joint  * [1] MILD pain (e.g., does not interfere with normal activities) AND [2] present > 7 days    Disposition: Appointment given per [facility] protocol.    Patient given ER precautions and S/S of DVT as she is on a long road trip.   Per:   patient verbalizes agreement to plan. Agrees to callback with any increase in symptoms/concerns or questions.     Val Riles, RN  PCN  Triage

## 2018-08-03 NOTE — Telephone Encounter (Signed)
3 patient identifiers used  Reviewed message from MD with patient. Questions were answered and no further questions or concerns at this time. Advised to please callback to the advice line at any time if questions arise.   Yaneisy Wenz, RN  PCN Triage

## 2018-08-03 NOTE — Telephone Encounter (Signed)
I recommend that the patient be evaluated in an urgent care facility or emergency room on her way rather than wait until an appointment in our clinic

## 2018-08-10 ENCOUNTER — Ambulatory Visit: Admitting: Internal Medicine

## 2018-08-14 ENCOUNTER — Ambulatory Visit: Admitting: INTERNAL MEDICINE

## 2018-08-14 ENCOUNTER — Encounter: Payer: Self-pay | Admitting: INTERNAL MEDICINE

## 2018-08-14 VITALS — BP 126/77 | HR 82 | Temp 98.0°F | Ht 64.0 in | Wt 157.4 lb

## 2018-08-14 DIAGNOSIS — M7662 Achilles tendinitis, left leg: Secondary | ICD-10-CM

## 2018-08-14 DIAGNOSIS — M76812 Anterior tibial syndrome, left leg: Secondary | ICD-10-CM

## 2018-08-14 DIAGNOSIS — M7752 Other enthesopathy of left foot: Secondary | ICD-10-CM

## 2018-08-14 MED ORDER — CELECOXIB 200 MG CAPSULE
200.0000 mg | ORAL_CAPSULE | Freq: Every day | ORAL | 1 refills | Status: DC
Start: 2018-08-14 — End: 2018-12-07

## 2018-08-14 NOTE — Nursing Note (Signed)
Patient identifiers obtained, vitals documented, allergies, and pharmacy verified.    Wanona Stare C Adrean Heitz MA

## 2018-08-14 NOTE — Progress Notes (Addendum)
Erica Hancock is a 63yr old female who is here complaining of:        CHIEF COMPLAINT:     Chief Complaint   Patient presents with    Foot Problem     pain and swelling x 5 wks       Patient is coming in 30 minutes late to her appointment    She is complaining of left ankle And foot pain    The condition started 5 weeks ago after [place]    Tried Advil once  which hurt her stomach    There is no history of any falls or trauma        Past Medical History:     Past Medical History:   Diagnosis Date    Asthma 02/11/2016    Basal cell carcinoma of skin of nose 04/14/2016    Depression     Elevated TSH 10/20/2016    Mixed hypercholesterolemia and hypertriglyceridemia     Osteopenia 04/14/2016    Recurrent major depressive disorder, in partial remission ([place]) 04/14/2016    Severe osteoarthritis of left AC (acromioclavicular) joint 09/14/2016    Tear of left supraspinatus tendon 09/14/2016           Past Surgical History:     Past Surgical History:   Procedure Laterality Date    INJECTION, STEROID      Right thumb    LAMINECTOMY, LUMBAR, POSTERIOR APPROACH  01/26/2017    L4 laminectomy, bilateral L3-4 and L4-5 foraminotomies    PR COLONOSCOPY FLX DX W/COLLJ SPEC WHEN PFRMD  2006    no polyps per pt    PR HAND/FINGER SURG PROC UNLISTED Right     thumb    PR VAGINAL HYSTERECTOMY,UTERUS 250 GMS/<      one ovary removed, couldn't find other    TONSILLECTOMY           Family History:     Family History   Problem Relation Name Age of Onset    Hypertension Mother      Heart Disease Mother      Blood Disease Father      Breast Cancer Sister          dx age 38         Social History:     Social History     Socioeconomic History    Marital status: MARRIED     Spouse name: Not on file    Number of children: Not on file    Years of education: Not on file    Highest education level: Not on file    Occupational History    Occupation: self employed     Comment: Aeronautical engineer   Social Needs    Financial resource strain: Not on file    Food insecurity     Worry: Not on file     Inability: Not on file    Transportation needs     Medical: Not on file     Non-medical: Not on file   Tobacco Use    Smoking status: Former Smoker     Packs/day: 1.00     Years: 12.00     Pack years: 12.00     Types: Cigarettes     Last attempt to quit: 03/01/1979     Years since quitting: 39.4    Smokeless tobacco: Never Used   Substance and Sexual Activity    Alcohol use: Yes  Alcohol/week: 6.0 standard drinks     Types: 6 Glasses of wine per week    Drug use: Yes     Frequency: 2.0 times per week     Types: Marijuana     Comment: once daily oil    Sexual activity: Not on file   Lifestyle    Physical activity     Days per week: Not on file     Minutes per session: Not on file    Stress: Not on file   Relationships    Social connections     Talks on phone: Not on file     Gets together: Not on file     Attends religious service: Not on file     Active member of club or organization: Not on file     Attends meetings of clubs or organizations: Not on file     Relationship status: Not on file    Intimate partner violence     Fear of current or ex partner: Not on file     Emotionally abused: Not on file     Physically abused: Not on file     Forced sexual activity: Not on file   Other Topics Concern    Not on file   Social History Narrative    Not on file         Allergies:     Allergies   Allergen Reactions    Epinephrine Tachycardia     per [place] (Sulfonamide Antibiotics) Anaphylaxis    Suture N/A     Patient states skin "goes to mush"         Medications:       Current Outpatient Medications:     Acetaminophen (TYLENOL) 325 mg Tablet, Take 325 mg by mouth if needed., Disp: , Rfl:     Albuterol (PROAIR HFA, PROVENTIL HFA, VENTOLIN HFA) 90 mcg/actuation inhaler, Take 2 puffs by  inhalation every 4 hours if needed., Disp: 1 inhaler, Rfl: 0    Amitriptyline (ELAVIL) 50 mg Tablet, Take 1 tablet by mouth every day at bedtime. Indications: depression, Disp: 90 tablet, Rfl: 0    BECLOMETHASONE DIPROPIONATE (QVAR INHA), Take 1 puff by inhalation once daily if needed.       , Disp: , Rfl:     Celecoxib (CELEBREX) 200 mg Capsule, Take 1 capsule by mouth every day., Disp: 30 capsule, Rfl: 1    ESTRACE 0.01 % (0.1 mg/gram) Vaginal Cream, Insert 1 g into the vagina three times a week., Disp: 42.5 g, Rfl: 3    Fluticasone (FLOVENT HFA) 110 mcg/actuation Inhaler, Take 1-2 puffs by inhalation every 12 hours., Disp: 17 g, Rfl: 0    Ibuprofen (MOTRIN) 800 mg Tablet, Take 1 tablet by mouth three times daily if needed for pain. (take with food), Disp: 270 tablet, Rfl: 0    lactase (LACTAID FAST ACT PO), Take 1 Caplet by mouth once daily if needed. With meal as needed      , Disp: , Rfl:     Ondansetron (ZOFRAN) 4 mg Tablet, Take 1 tablet by mouth every 8 hours if needed., Disp: 9 tablet, Rfl: 0    Trazodone (DESYREL) 50 mg Tablet, Take 1 tablet by mouth every day at bedtime., Disp: 30 tablet, Rfl: 0      Labs:     No visits with results within 6 Month(s) from this visit.   Latest known visit with results is:   [facility]  Outpatient Visit on 10/11/2017   Component Date Value Ref Range Status    CASE REPORT 10/11/2017    Final                    Value:SURGICAL PATHOLOGY                                Case: [id number]                                  Authorizing Provider:  Leonides Sake        Collected:           10/11/2017 [place], MD                                                                   Ordering Location:     [facility]       Received:            10/11/2017 1610              Pathologist:           Almeta Monas, MD                                                    Specimens:   A) - COLON, SIGMOID, Pedunculated polyp                                                              B) - COLON, TRANSVERSE, Sessile polyp                                                      FINAL DIAGNOSIS 10/11/2017    Final                    Value:This result contains rich text formatting which cannot be displayed here.    MICROSCOPIC DESCRIPTION 10/11/2017    Final                    Value:This result contains rich text formatting which cannot be displayed here.    GROSS DESCRIPTION 10/11/2017    Final                    Value:This result contains rich text formatting which cannot be displayed here.    CLINICAL INFORMATION 10/11/2017  Final                    Value:This result contains rich text formatting which cannot be displayed here.    DISCLAIMER 10/11/2017    Final                    Value:This result contains rich text formatting which cannot be displayed here.         Immunizations:     Immunization History   Administered Date(s) Administered    Influenza Vaccine, Quadrivalent (Fluarix/Flulaval/Fluzone Prefill Syringe) 12/12/2017    Influenza Vaccine, Quadrivalent (Flulaval) 12/02/2016         Review of Systems:       Review of Systems   Musculoskeletal:        Left ankle and foot pain   All other systems reviewed and are negative.         Physical Examination:         BP 126/77 (SITE: right arm, Orthostatic Position: sitting, Cuff Size: regular)   Pulse 82   Temp 36.7 C (98 F) (Temporal)   Ht 1.626 m (5\' 4" )   Wt 71.4 kg (157 lb 6.5 oz)   BMI 27.02 kg/m     Physical Exam  Vitals signs and nursing note reviewed.   Constitutional:       Appearance: Normal appearance.   Musculoskeletal:      Comments: Left ankle is slightly swollen  Tender over the Achilles tendon  Tender over anterior tibialis tendon  Tender over the calcaneal bursa   Neurological:      Mental Status: She is alert.            Assessment and Plan:         Wyatt was seen today for foot problem.    Diagnoses and all orders for this visit:    Achilles tendinitis of left  lower extremity  -     Celecoxib (CELEBREX) 200 mg Capsule; Take 1 capsule by mouth every day.    Anterior tibialis tendonitis of left leg  -     Celecoxib (CELEBREX) 200 mg Capsule; Take 1 capsule by mouth every day.    Calcaneal bursitis (heel), left  -     Celecoxib (CELEBREX) 200 mg Capsule; Take 1 capsule by mouth every day.       Care instructions  Rest  Stretch  Heat  Take above medication    Follow-up          I have reviewed with [name] and appropriately updated, if needed, all the included elements of the patient's history.      Discussed with patient/caregiver possible alarm signs and symptoms and to call or return to clinic prn if these symptoms occur or current symptoms worsen or fail to improve as anticipated. No guarantees were made regarding medical care or treatment outcome.      Barriers to Learning: none.   Patient verbalizes understanding of teaching and instructions.    This note was created using Systems analyst. Some minor dictation errors may be present despite editing. Grammatical, sound-alike, or syntax errors are likely dictation errors.         Karie Georges, M.D.    Internal Medicine/ Pediatrics    [place], [place] [facility]

## 2018-08-16 ENCOUNTER — Ambulatory Visit: Admitting: INTERNAL MEDICINE

## 2018-08-16 VITALS — BP 115/62 | HR 96 | Temp 98.0°F | Resp 10 | Wt 157.0 lb

## 2018-08-16 DIAGNOSIS — F5104 Psychophysiologic insomnia: Secondary | ICD-10-CM

## 2018-08-16 DIAGNOSIS — Z7689 Persons encountering health services in other specified circumstances: Secondary | ICD-10-CM

## 2018-08-16 DIAGNOSIS — F32A Depression, unspecified: Secondary | ICD-10-CM

## 2018-08-16 DIAGNOSIS — Z803 Family history of malignant neoplasm of breast: Secondary | ICD-10-CM | POA: Insufficient documentation

## 2018-08-16 DIAGNOSIS — N816 Rectocele: Secondary | ICD-10-CM | POA: Insufficient documentation

## 2018-08-16 DIAGNOSIS — F329 Major depressive disorder, single episode, unspecified: Secondary | ICD-10-CM

## 2018-08-16 MED ORDER — AMITRIPTYLINE 50 MG TABLET
50.0000 mg | ORAL_TABLET | Freq: Every day | ORAL | 2 refills | Status: AC
Start: 2018-08-16 — End: 2019-02-12

## 2018-08-16 NOTE — Progress Notes (Signed)
Erica Hancock is a 63yr old female who is here complaining of:        CHIEF COMPLAINT:     Chief Complaint   Patient presents with    New Medication Request    Leg Problem    Rectal Problem    Referral       Patient is here to establish care    Patient was diagnosed in [place] recently with rectocele    She has been having a protrusion into her vagina recently, but no stools    Condition started 8 weeks ago    Patient had a hysterectomy at the age of 63    The condition is very bothersome and stressful to the patient    Patient started evaluation in [place] but never completed it as she had to come back to [place]    Patient also is known to have chronic insomnia, for which she takes and feel comfortable with amitriptyline nightly at 50 mg    She would like to refill her medications    Patient states she has a family history of breast cancer.  Of which the sister was diagnosed    She was never offered any genetic testing    Patient had a mammogram last month in [place] as well as blood work for which she was told she has a high cholesterol of 270      Past Medical History:     Past Medical History:   Diagnosis Date    Asthma 02/11/2016    Basal cell carcinoma of skin of nose 04/14/2016    Depression     Elevated TSH 10/20/2016    Mixed hypercholesterolemia and hypertriglyceridemia     Osteopenia 04/14/2016    Recurrent major depressive disorder, in partial remission ([place]) 04/14/2016    Severe osteoarthritis of left AC (acromioclavicular) joint 09/14/2016    Tear of left supraspinatus tendon 09/14/2016           Past Surgical History:     Past Surgical History:   Procedure Laterality Date    INJECTION, STEROID      Right thumb    LAMINECTOMY, LUMBAR, POSTERIOR APPROACH  01/26/2017    L4 laminectomy, bilateral L3-4 and L4-5 foraminotomies    PR COLONOSCOPY FLX DX W/COLLJ SPEC WHEN PFRMD  2006    no polyps  per pt    PR HAND/FINGER SURG PROC UNLISTED Right     thumb    PR VAGINAL HYSTERECTOMY,UTERUS 250 GMS/<      one ovary removed, couldn't find other    TONSILLECTOMY           Family History:     Family History   Problem Relation Name Age of Onset    Hypertension Mother      Heart Disease Mother      Blood Disease Father      Breast Cancer Sister          dx age 52         Social History:     Social History     Socioeconomic History    Marital status: MARRIED     Spouse name: Not on file    Number of children: Not on file    Years of education: Not on file    Highest education level: Not on file   Occupational History    Occupation: self employed     Comment: Aeronautical engineer   Social Transport planner  strain: Not on file    Food insecurity     Worry: Not on file     Inability: Not on file    Transportation needs     Medical: Not on file     Non-medical: Not on file   Tobacco Use    Smoking status: Former Smoker     Packs/day: 1.00     Years: 12.00     Pack years: 12.00     Types: Cigarettes     Last attempt to quit: 03/01/1979     Years since quitting: 39.4    Smokeless tobacco: Never Used   Substance and Sexual Activity    Alcohol use: Yes     Alcohol/week: 6.0 standard drinks     Types: 6 Glasses of wine per week    Drug use: Yes     Frequency: 2.0 times per week     Types: Marijuana     Comment: once daily oil    Sexual activity: Not on file   Lifestyle    Physical activity     Days per week: Not on file     Minutes per session: Not on file    Stress: Not on file   Relationships    Social connections     Talks on phone: Not on file     Gets together: Not on file     Attends religious service: Not on file     Active member of club or organization: Not on file     Attends meetings of clubs or organizations: Not on file     Relationship status: Not on file    Intimate partner violence     Fear of current or ex partner: Not on file     Emotionally abused: Not on file      Physically abused: Not on file     Forced sexual activity: Not on file   Other Topics Concern    Not on file   Social History Narrative    Not on file         Allergies:     Allergies   Allergen Reactions    Epinephrine Tachycardia     per [place] (Sulfonamide Antibiotics) Anaphylaxis    Suture N/A     Patient states skin "goes to mush"         Medications:       Current Outpatient Medications:     Acetaminophen (TYLENOL) 325 mg Tablet, Take 325 mg by mouth if needed., Disp: , Rfl:     Albuterol (PROAIR HFA, PROVENTIL HFA, VENTOLIN HFA) 90 mcg/actuation inhaler, Take 2 puffs by inhalation every 4 hours if needed., Disp: 1 inhaler, Rfl: 0    Amitriptyline (ELAVIL) 50 mg Tablet, Take 1 tablet by mouth every day at bedtime. Indications: depression, Disp: 90 tablet, Rfl: 2    BECLOMETHASONE DIPROPIONATE (QVAR INHA), Take 1 puff by inhalation once daily if needed.       , Disp: , Rfl:     Celecoxib (CELEBREX) 200 mg Capsule, Take 1 capsule by mouth every day., Disp: 30 capsule, Rfl: 1    ESTRACE 0.01 % (0.1 mg/gram) Vaginal Cream, Insert 1 g into the vagina three times a week., Disp: 42.5 g, Rfl: 3    Fluticasone (FLOVENT HFA) 110 mcg/actuation Inhaler, Take 1-2 puffs by inhalation every 12 hours., Disp: 17 g, Rfl: 0    Ibuprofen (MOTRIN) 800 mg Tablet, Take 1 tablet by mouth  three times daily if needed for pain. (take with food), Disp: 270 tablet, Rfl: 0    lactase (LACTAID FAST ACT PO), Take 1 Caplet by mouth once daily if needed. With meal as needed      , Disp: , Rfl:     Ondansetron (ZOFRAN) 4 mg Tablet, Take 1 tablet by mouth every 8 hours if needed., Disp: 9 tablet, Rfl: 0    Trazodone (DESYREL) 50 mg Tablet, Take 1 tablet by mouth every day at bedtime., Disp: 30 tablet, Rfl: 0      Labs:     No visits with results within 6 Month(s) from this visit.   Latest known visit with results is:   Hospital Outpatient Visit on 10/11/2017   Component Date Value Ref Range Status    CASE REPORT  10/11/2017    Final                    Value:SURGICAL PATHOLOGY                                Case: [id number]                                  Authorizing Provider:  Leonides Sake        Collected:           10/11/2017 1447                                     Kellie Simmering, MD                                                                   Ordering Location:     [facility]       Received:            10/11/2017 1610              Pathologist:           Almeta Monas, MD                                                    Specimens:   A) - COLON, SIGMOID, Pedunculated polyp                                                             B) - COLON, TRANSVERSE, Sessile polyp                                                      FINAL DIAGNOSIS 10/11/2017  Final                    Value:This result contains rich text formatting which cannot be displayed here.    MICROSCOPIC DESCRIPTION 10/11/2017    Final                    Value:This result contains rich text formatting which cannot be displayed here.    GROSS DESCRIPTION 10/11/2017    Final                    Value:This result contains rich text formatting which cannot be displayed here.    CLINICAL INFORMATION 10/11/2017    Final                    Value:This result contains rich text formatting which cannot be displayed here.    DISCLAIMER 10/11/2017    Final                    Value:This result contains rich text formatting which cannot be displayed here.         Immunizations:     Immunization History   Administered Date(s) Administered    Influenza Vaccine, Quadrivalent (Fluarix/Flulaval/Fluzone Prefill Syringe) 12/12/2017    Influenza Vaccine, Quadrivalent (Flulaval) 12/02/2016         Review of Systems:       Review of Systems   Gastrointestinal: Positive for constipation.        Rectocele diagnosed recently   All other systems reviewed and are negative.         Physical Examination:         BP 115/62 (SITE: right arm, Orthostatic  Position: sitting, Cuff Size: regular)   Pulse 96   Temp 36.7 C (98 F) (Tympanic)   Resp 10   Wt 71.2 kg (156 lb 15.5 oz)   SpO2 98%   BMI 26.94 kg/m     Physical Exam  Vitals signs and nursing note reviewed.   Constitutional:       Appearance: Normal appearance.   HENT:      Mouth/Throat:      Mouth: Mucous membranes are moist.      Pharynx: Oropharynx is clear.   Eyes:      Conjunctiva/sclera: Conjunctivae normal.   Cardiovascular:      Rate and Rhythm: Normal rate and regular rhythm.      Heart sounds: Normal heart sounds.   Pulmonary:      Effort: Pulmonary effort is normal.      Breath sounds: Normal breath sounds.   Abdominal:      General: Abdomen is flat.      Palpations: Abdomen is soft.   Musculoskeletal:      Right lower leg: No edema.      Left lower leg: Edema present.   Neurological:      Mental Status: She is alert.            Assessment and Plan:         Reis was seen today for new medication request, leg problem, rectal problem and referral.    Diagnoses and all orders for this visit:    Rectocele  -     [place]  -     OB/GYN - General Referral    Encounter to establish care    Chronic insomnia  -     Amitriptyline (ELAVIL) 50 mg Tablet; Take  1 tablet by mouth every day at bedtime. Indications: depression    Depression, unspecified depression type    Family history of breast cancer  -     OB/GYN - Genetic Counseling Referral    Care instructions    Obtain records from [place] for her recent mammograms as well as labs    Referrals initiated    Follow-up closely            I have reviewed with [name] and appropriately updated, if needed, all the included elements of the patient's history.      Discussed with patient/caregiver possible alarm signs and symptoms and to call or return to clinic prn if these symptoms occur or current symptoms worsen or fail to improve as anticipated. No guarantees were made regarding medical care or treatment outcome.      Barriers to  Learning: none.   Patient verbalizes understanding of teaching and instructions.    This note was created using Systems analyst. Some minor dictation errors may be present despite editing. Grammatical, sound-alike, or syntax errors are likely dictation errors.         Karie Georges, M.D.    Internal Medicine/ Pediatrics    [place], [place] [facility]

## 2018-08-16 NOTE — Nursing Note (Signed)
vital signs taken,allergies and pharmacy verified,screened for pain.

## 2018-09-04 ENCOUNTER — Telehealth: Payer: Self-pay | Admitting: INTERNAL MEDICINE

## 2018-09-04 NOTE — Telephone Encounter (Signed)
Regarding the patient inquiry for labs; pt was supposed to provide her records for labs and Mammo from [place] before we order any.    Kindly follow up on that    Thanks

## 2018-09-04 NOTE — Telephone Encounter (Signed)
Patient checking status on the outside records regarding her labs and mammograms.  She is needing to know if she is to do labs.  She knows she has high cholesterol and is wondering if she needs to repeat any labs.

## 2018-09-04 NOTE — Telephone Encounter (Signed)
Called patient and it looks like we received a couple records from [facility] that has some labs that she completed in March and April (Please see Ambulatory Summary for [facility], there you can find the test results) . We do not have any record of the DEXA scan or Mammogram, she states that we are supposed to be receiving a fax with her DEXA Scan results sometime today. And that she is having her Mammogram results on CD mailed to her home.Once she receives it she will bring it to the office.    Patient expresses concern because her DEXA scan states that she has osteoporosis and she had back surgery done 3 years ago. She also is concerned that her cholesterol is high.    Patient states that she is also wanting to request an extension on her temporary handy cap placard, I informed that she would need an appointment to discuss that.

## 2018-09-05 ENCOUNTER — Ambulatory Visit: Admitting: Internal Medicine

## 2018-09-05 DIAGNOSIS — M545 Low back pain, unspecified: Secondary | ICD-10-CM

## 2018-09-05 MED ORDER — GABAPENTIN 100 MG CAPSULE
100.0000 mg | ORAL_CAPSULE | Freq: Every day | ORAL | 3 refills | Status: DC
Start: 2018-09-05 — End: 2018-11-27

## 2018-09-05 NOTE — Progress Notes (Signed)
Video Visit Note     I performed this clinical encounter by utilizing a real time telehealth video connection between my location and the patient's location. The patient's location was confirmed during this visit. I obtained verbal consent from the patient to perform this clinical encounter utilizing video and prepared the patient by answering any questions they had about the telehealth interaction.      Subjective:  Erica Hancock is a 63yr old female who is here for the following reason:  Primary care physician Dr. Norlene Duel.   Acute on chronic back pain.   She states she had back surgery about 2 years ago with Dr. Maudie Mercury.   She has some mild pain off and on.  It got worse over past 10 days after driving cross country in a RV and then really bad about 4-5 days when she pulled a box.   Started getting tingling pain in her low back at first, then by the end of the day the pain worsened.   She restarted Celebrex 9-10 days ago.  Does not to seem to be touching the pain.   She recently tried taking some motrin but not really helping.   She is having difficulty getting comfortable to sleep despite taking her usual amitriptyline 50 mg at bedtime, which she states for longtime depression.  No new lower extremity weakness.   Still doing things around the house.  Getting ready to go on another extended RV trip the end of the month.     Review of Systems -   Constitutional: negative.  Musculoskeletal: joint swelling, joint redness.  Neuro:negative for motor strength deficit.     Social History     Tobacco Use    Smoking status: Former Smoker     Packs/day: 1.00     Years: 12.00     Pack years: 12.00     Types: Cigarettes     Last attempt to quit: 03/01/1979     Years since quitting: 39.5    Smokeless tobacco: Never Used   Substance Use Topics    Alcohol use: Yes     Alcohol/week: 6.0 standard drinks     Types: 6 Glasses of wine per week    Drug use: Yes     Frequency: 2.0 times per week     Types: Marijuana     Comment:  once daily oil       Patient Active Problem List   Diagnosis    Migraines    Insomnia    Arthritis of carpometacarpal (CMC) joint of right thumb    Asthma    HLD (hyperlipidemia)    Spondylolisthesis of lumbar region    Foraminal stenosis of lumbar region    Lumbar radicular pain    Calcific tendinitis of left shoulder    Pain in joint of left shoulder    Basal cell carcinoma of skin of nose    Basal cell carcinoma    Osteopenia    Recurrent major depressive disorder, in partial remission (HCC)    Mixed hypercholesterolemia and hypertriglyceridemia    Tear of left supraspinatus tendon    Severe osteoarthritis of left AC (acromioclavicular) joint    Elevated TSH    Tarlov cyst    Left leg pain    Pain and swelling of left lower leg    S/P lumbar fusion    Reactive depression    Rectocele    Family history of breast cancer     Current Outpatient Medications on  File Prior to Visit   Medication Sig Dispense Refill    Acetaminophen (TYLENOL) 325 mg Tablet Take 325 mg by mouth if needed.      Albuterol (PROAIR HFA, PROVENTIL HFA, VENTOLIN HFA) 90 mcg/actuation inhaler Take 2 puffs by inhalation every 4 hours if needed. 1 inhaler 0    Amitriptyline (ELAVIL) 50 mg Tablet Take 1 tablet by mouth every day at bedtime. Indications: depression 90 tablet 2    BECLOMETHASONE DIPROPIONATE (QVAR INHA) Take 1 puff by inhalation once daily if needed.                 Celecoxib (CELEBREX) 200 mg Capsule Take 1 capsule by mouth every day. 30 capsule 1    ESTRACE 0.01 % (0.1 mg/gram) Vaginal Cream Insert 1 g into the vagina three times a week. 42.5 g 3    Fluticasone (FLOVENT HFA) 110 mcg/actuation Inhaler Take 1-2 puffs by inhalation every 12 hours. 17 g 0    Ibuprofen (MOTRIN) 800 mg Tablet Take 1 tablet by mouth three times daily if needed for pain. (take with food) 270 tablet 0    lactase (LACTAID FAST ACT PO) Take 1 Caplet by mouth once daily if needed. With meal as needed                Ondansetron  (ZOFRAN) 4 mg Tablet Take 1 tablet by mouth every 8 hours if needed. 9 tablet 0    Trazodone (DESYREL) 50 mg Tablet Take 1 tablet by mouth every day at bedtime. 30 tablet 0     No current facility-administered medications on file prior to visit.      OBJECTIVE:  General Appearance: healthy, alert, no distress, pleasant affect, cooperative.    ASSESSMENT:  (M54.5) Acute midline low back pain without sciatica  (primary encounter diagnosis)  Plan: Gabapentin (NEURONTIN) 100 mg Capsule        Trial of gabapentin 100-300 mg at bedtime.   Discussed caution with amitriptyline given side effects of sedation.   Also recommend to start acetaminophen 1000 mg three times a day.   Recommend close follow-up with primary care physician.     PLAN:  See Orders. Discussed with the patient and all questions fully answered. She will call me if any problems arise.     I did review patient's past medical and family/social history, no changes noted.  Barriers to Learning assessed: none. Patient verbalizes understanding of teaching and instructions.    Electronically signed by:  Cephus Shelling Jimmye Norman, D.O.  Associate Physician  [place]    I spent a total of 25 minutes today on this patient's visit. This included 20 minutes of face to face time, more than 50% of which was spent in counseling or coordinating care, for the following medical problem(s) back pain. The remainder of the time was spent on review of the patient record (including but not limited to clinical notes, outside records, laboratory and radiographic studies), medical decision-making, and documentation of the visit.

## 2018-09-05 NOTE — Telephone Encounter (Signed)
Patient calling back with more information for Dr. Norlene Duel.    She states she was seen by Blase Mess a Specialist [phone]) for Colorectal Surgery in [place] phone # [phone]  [phone]    Blase Mess Referred her to have Dynamic Evaluation Defecogram for Pelvic Floor Disorder done however it was not competed.

## 2018-09-18 NOTE — Progress Notes (Addendum)
[facility] - Consult H&P   Attending: Dr. Almira Bar [phone], [phone])   Date of Appointment: 09/19/2018         CHIEF COMPLAINT / REASON FOR CONSULT     Rectocele    SUBJECTIVE     Erica Hancock is a 63yr female presenting to the Colon and Rectal Surgery clinic today for rectocele evaluation. She was referred to Dr. Andee Lineman by Dr. Rise Paganini (internal medicine). Patient visited Dr. Rise Paganini on 08/16/18 with complaints of protrusions into her vagina, but denies stools. She reported that she was diagnosed in [place].    She stated she was having issues w/ uterine prolapse and underwent a vaginal hysterectomy when she was 63. She stated she has also dealt with chronic constipation for the majority of her life but never really had it addressed nor took anything for it. Over the last several months, she noticed progressive bulging into her vagina and a pulling sensation when she had to strain to have a BM. She would at times have to use a finger to push her rectum from inside her vagina to pass stool. Denied incontinence or blood. Started taking stool softeners and had a gradual improvement in her symptoms. She now doesn't have to strain to have a BM and feels mostly symptom free, but still has occasional tugging sensations w/ BMs.          COLORECTAL SURGICAL HISTORY     None at this time.     PAST MEDICAL HISTORY     Active Ambulatory Problems     Diagnosis Date Noted    Migraines 02/11/2016    Insomnia 02/11/2016    Arthritis of carpometacarpal (CMC) joint of right thumb 02/11/2016    Asthma 02/11/2016    HLD (hyperlipidemia) 02/11/2016    Spondylolisthesis of lumbar region 02/11/2016    Foraminal stenosis of lumbar region 02/11/2016    Lumbar radicular pain 02/11/2016    Calcific tendinitis of left shoulder 03/27/2016    Pain in joint of left shoulder 03/27/2016    Basal cell carcinoma of skin of nose 04/14/2016    Basal cell carcinoma 04/14/2016    Osteopenia 04/14/2016     Recurrent major depressive disorder, in partial remission ([place]) 04/14/2016    Mixed hypercholesterolemia and hypertriglyceridemia     Tear of left supraspinatus tendon 09/14/2016    Severe osteoarthritis of left AC (acromioclavicular) joint 09/14/2016    Elevated TSH 10/20/2016    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    Left leg pain 02/16/2017    Pain and swelling of left lower leg 02/16/2017    S/P lumbar fusion 03/20/2017    Reactive depression 03/20/2017    Rectocele 08/16/2018    Family history of breast cancer 08/16/2018     Resolved Ambulatory Problems     Diagnosis Date Noted    No Resolved Ambulatory Problems     Past Medical History:   Diagnosis Date    Depression         PAST SURGICAL HISTORY     Past Surgical History:   Procedure Laterality Date    INJECTION, STEROID      Right thumb    LAMINECTOMY, LUMBAR, POSTERIOR APPROACH  01/26/2017    L4 laminectomy, bilateral L3-4 and L4-5 foraminotomies    PR COLONOSCOPY FLX DX W/COLLJ SPEC WHEN PFRMD  2006    no polyps per pt    PR HAND/FINGER SURG PROC UNLISTED Right     thumb  PR VAGINAL HYSTERECTOMY,UTERUS 250 GMS/<      one ovary removed, couldn't find other    TONSILLECTOMY          MEDICATIONS     Current Outpatient Medications on File Prior to Visit   Medication    Acetaminophen (TYLENOL) 325 mg Tablet    Albuterol (PROAIR HFA, PROVENTIL HFA, VENTOLIN HFA) 90 mcg/actuation inhaler    Amitriptyline (ELAVIL) 50 mg Tablet    BECLOMETHASONE DIPROPIONATE (QVAR INHA)    Celecoxib (CELEBREX) 200 mg Capsule    ESTRACE 0.01 % (0.1 mg/gram) Vaginal Cream    Fluticasone (FLOVENT HFA) 110 mcg/actuation Inhaler    Gabapentin (NEURONTIN) 100 mg Capsule    Ibuprofen (MOTRIN) 800 mg Tablet    lactase (LACTAID FAST ACT PO)    Ondansetron (ZOFRAN) 4 mg Tablet    Trazodone (DESYREL) 50 mg Tablet     No current facility-administered medications on file prior to visit.         ALLERGIES     Allergies   Allergen Reactions    Epinephrine Tachycardia     per  [place] (Sulfonamide Antibiotics) Anaphylaxis    Suture N/A     Patient states skin "goes to mush"        SOCIAL HISTORY     Social History     Tobacco Use    Smoking status: Former Smoker     Packs/day: 1.00     Years: 12.00     Pack years: 12.00     Types: Cigarettes     Last attempt to quit: 03/01/1979     Years since quitting: 39.5    Smokeless tobacco: Never Used   Substance Use Topics    Alcohol use: Yes     Alcohol/week: 6.0 standard drinks     Types: 6 Glasses of wine per week    Drug use: Yes     Frequency: 2.0 times per week     Types: Marijuana     Comment: once daily oil            FAMILY HISTORY     Family History   Problem Relation Name Age of Onset    Hypertension Mother      Heart Disease Mother      Blood Disease Father      Breast Cancer Sister          dx age 71       No history of colon cancer, ulcerative colitis, or Crohn's disease.   No history of inflammatory bowel disease.     REVIEW OF SYMPTOMS     Review of Systems   CONST: denies fever, chills.  NEURO: denies loss of sensation, paresthesias.  PSYCH: denies hallucinations, delusions. Denies depression, anxiety.   SKIN: denies rashes or lesions.  GI: denies nausea, emesis. Denies diarrhea. Denies constipation.   URO: denies dysuria. Denies fecaluria and pneumaturia.   CV: denies chest pain, irregular heartbeats.  PULM: denies shortness of breath. Denies wheezing.   MUSC: denies loss of strength. Denies difficulty with upper and lower extremity use.   ENDO: denies hot/cold flashes. Denies lethargy. Denies excessive fatigue.        PHYSICAL EXAM     Recent Vital Signs:  Temp: 36.6 C (97.8 F) (07/22 1422)  Temp src: --  Pulse: 55 (07/22 1422)  BP: 119/75 (07/22 1422)  Resp: 18 (07/22 1422)  SpO2: --  Height: --  Weight: 71 kg (156 lb 9.1  oz) (07/22 1422)    Physical Exam  HEAD: Normocephalic.   NECK: supple, normal thyroid. No palpable lymphadenopathy.  MOUTH: moist mucosa. No thrush noted. Good dentition.   LUNGS:  clear to auscultation. No wheezes, crackles, or stridor.   HEART: regular rate and rhythm.  ABD: soft, nontender. Nondistended. No palpable masses.   ANO/RECT: Moderate sized rectocele   LE: warm, well-perfused. No edema noted.  PSY: Normal affect and mentation.        RECENT IMAGING     No recent imaging on file.     RECENT LABS     White Blood Cell Count   Date Value Ref Range Status   09/08/2017 5.9 4.5 - 11.0 K/MM3 Final     Hemoglobin   Date Value Ref Range Status   09/08/2017 14.9 12.0 - 16.0 g/dL Final     Platelet Count   Date Value Ref Range Status   09/08/2017 403 (H) 130 - 400 K/MM3 Final     Sodium   Date Value Ref Range Status   09/08/2017 140 135 - 145 mmol/L Final     Potassium   Date Value Ref Range Status   09/08/2017 4.5 3.3 - 5.0 mmol/L Final     Creatinine Serum   Date Value Ref Range Status   09/08/2017 0.84 0.44 - 1.27 mg/dL Final     Albumin   Date Value Ref Range Status   09/08/2017 4.4 3.2 - 4.6 g/dL Final        ASSESSMENT AND PLAN     Saryiah Bencosme is a 11yr female with a moderate sized rectocele, appears symptomatically controlled at this time with current bowel regimen.     - Ordered MR defecography  - Referral to urogynocology for surgical evaluation   - No acute intervention at this time per CRS     Patient was seen with Dr. Melida Gimenez MD  General Surgery PGY 3  Colorectal Surgery        Attending attestation: I have seen and examined the patient in the clinic with the resident in attendance. I have also reviewed and edited the clinic note for accuracy.     Ellis Parents, MD FACS FASCRS  Associate Professor of Surgery  Interim Chief of Division of Colon and Rectal Surgery  Director of Robotic Surgical Education

## 2018-09-19 ENCOUNTER — Ambulatory Visit

## 2018-09-19 VITALS — BP 119/75 | HR 55 | Temp 97.8°F | Resp 18 | Wt 156.6 lb

## 2018-09-19 DIAGNOSIS — N816 Rectocele: Secondary | ICD-10-CM | POA: Insufficient documentation

## 2018-09-19 NOTE — Nursing Note (Signed)
Blood pressure 119/75, pulse 55, temperature 36.6 C (97.8 F), resp. rate 18, weight 71 kg (156 lb 9.1 oz).  Patient's name and DOB verified. Vital signs taken, screened for pain, allergies and pharmacy verified.     Loucille Takach, MAII

## 2018-09-20 ENCOUNTER — Encounter: Payer: Self-pay | Admitting: Internal Medicine

## 2018-09-20 ENCOUNTER — Telehealth: Payer: Self-pay | Admitting: Internal Medicine

## 2018-09-20 ENCOUNTER — Ambulatory Visit: Admitting: Internal Medicine

## 2018-09-20 VITALS — BP 117/78 | HR 85 | Temp 97.6°F | Resp 14 | Ht 64.0 in | Wt 157.2 lb

## 2018-09-20 DIAGNOSIS — E782 Mixed hyperlipidemia: Secondary | ICD-10-CM

## 2018-09-20 DIAGNOSIS — M8580 Other specified disorders of bone density and structure, unspecified site: Secondary | ICD-10-CM

## 2018-09-20 DIAGNOSIS — G8929 Other chronic pain: Secondary | ICD-10-CM

## 2018-09-20 DIAGNOSIS — N816 Rectocele: Secondary | ICD-10-CM

## 2018-09-20 DIAGNOSIS — M5441 Lumbago with sciatica, right side: Secondary | ICD-10-CM

## 2018-09-20 DIAGNOSIS — M5442 Lumbago with sciatica, left side: Secondary | ICD-10-CM

## 2018-09-20 MED ORDER — ATORVASTATIN 10 MG TABLET
10.0000 mg | ORAL_TABLET | Freq: Every day | ORAL | 11 refills | Status: DC
Start: 2018-09-20 — End: 2018-11-06

## 2018-09-20 NOTE — Nursing Note (Signed)
Patient roomed, Vitals obtained with pt's consent, checked for medication updates and refills.   Okie Bogacz,  MA  PPE Used During the Visit:    Eye and Face Protection:    Body Protection:   [x]Glasses/Goggles      []Gown   [x]Face Shield   [x]Surgical Mask     Hand Protection:    []N-95 Mask       []Gloves   []Particulate Respirator   []Half/Full Face Elastomeric Respirator   []PAPR

## 2018-09-20 NOTE — Progress Notes (Signed)
[place] COMPLAINT     Chief Complaint   Patient presents with    Medication Follow Up    Form Request    Other     records        HISTORY     Erica Hancock is a 63yr old female who presents to clinic for the following:    1. Hx of osteopenia.  Says she had a repeat DEXA scan earlier this year in [place] and has been trying to get results sent to [place].  Taking Vitamin D, Calcium, glucosamine.  Hx of osteoporosis in family.  2. Chronic pain. Hx of lumbar fusion.  Now having some sciatica b/l.  Has b/l hip pain.  Moving to [place] within next few months and will be travelling between now and then, so repeating PT or pain management visits at this time will be difficult.  Taking gabapentin but only 100mg  qhs--tolerating it well  3. HLD.  Outside labs brought in by patient from 06/14/18 which shows Tchol 272, Triglycerides 290, LDL 162, HDL 58    REVIEW OF SYSTEMS     Review of Systems   Genitourinary: Negative.    See HPI    SOCIAL HISTORY     Social History     Tobacco Use    Smoking status: Former Smoker     Packs/day: 1.00     Years: 12.00     Pack years: 12.00     Types: Cigarettes     Last attempt to quit: 03/01/1979     Years since quitting: 39.5    Smokeless tobacco: Never Used   Substance Use Topics    Alcohol use: Yes     Alcohol/week: 6.0 standard drinks     Types: 6 Glasses of wine per week    Drug use: Yes     Frequency: 2.0 times per week     Types: Marijuana     Comment: once daily oil       PROBLEM LIST     Patient Active Problem List   Diagnosis    Migraines    Insomnia    Arthritis of carpometacarpal (CMC) joint of right thumb    Asthma    HLD (hyperlipidemia)    Spondylolisthesis of lumbar region    Foraminal stenosis of lumbar region    Lumbar radicular pain    Calcific tendinitis of left shoulder    Pain in joint of left shoulder    Basal cell carcinoma of skin of nose    Basal cell carcinoma    Osteopenia    Recurrent major depressive disorder, in  partial remission ([place])    Mixed hypercholesterolemia and hypertriglyceridemia    Tear of left supraspinatus tendon    Severe osteoarthritis of left AC (acromioclavicular) joint    Elevated TSH    Tarlov cyst    Left leg pain    Pain and swelling of left lower leg    S/P lumbar fusion    Reactive depression    Rectocele    Family history of breast cancer       MEDICATIONS     Current Outpatient Medications on File Prior to Visit   Medication Sig Dispense Refill    Acetaminophen (TYLENOL) 325 mg Tablet Take 325 mg by mouth if needed.      Albuterol (PROAIR HFA, PROVENTIL HFA, VENTOLIN HFA) 90 mcg/actuation inhaler Take 2 puffs by inhalation every 4 hours if needed. 1 inhaler 0  Amitriptyline (ELAVIL) 50 mg Tablet Take 1 tablet by mouth every day at bedtime. Indications: depression 90 tablet 2    BECLOMETHASONE DIPROPIONATE (QVAR INHA) Take 1 puff by inhalation once daily if needed.                 Celecoxib (CELEBREX) 200 mg Capsule Take 1 capsule by mouth every day. 30 capsule 1    ESTRACE 0.01 % (0.1 mg/gram) Vaginal Cream Insert 1 g into the vagina three times a week. 42.5 g 3    Fluticasone (FLOVENT HFA) 110 mcg/actuation Inhaler Take 1-2 puffs by inhalation every 12 hours. 17 g 0    Gabapentin (NEURONTIN) 100 mg Capsule Take 1-3 capsules by mouth every day at bedtime. 270 each 3    Ibuprofen (MOTRIN) 800 mg Tablet Take 1 tablet by mouth three times daily if needed for pain. (take with food) 270 tablet 0    lactase (LACTAID FAST ACT PO) Take 1 Caplet by mouth once daily if needed. With meal as needed                Ondansetron (ZOFRAN) 4 mg Tablet Take 1 tablet by mouth every 8 hours if needed. 9 tablet 0    Trazodone (DESYREL) 50 mg Tablet Take 1 tablet by mouth every day at bedtime. 30 tablet 0     No current facility-administered medications on file prior to visit.        VITAL SIGNS    height is 1.626 m (5\' 4" ) and weight is 71.3 kg (157 lb 3 oz). Her temporal temperature is 36.4 C  (97.6 F). Her blood pressure is 117/78 and her pulse is 85. Her respiration is 14 and oxygen saturation is 97%.     PHYSICAL     Physical Exam  Constitutional:       General: She is not in acute distress.     Appearance: Normal appearance. She is well-developed.   HENT:      Head: Normocephalic and atraumatic.      Nose: No congestion.   Eyes:      Extraocular Movements: Extraocular movements intact.      Conjunctiva/sclera: Conjunctivae normal.   Pulmonary:      Effort: Pulmonary effort is normal. No respiratory distress.   Musculoskeletal:      Right lower leg: No edema.      Left lower leg: No edema.   Neurological:      General: No focal deficit present.      Mental Status: She is alert and oriented to person, place, and time. She is not disoriented.   Psychiatric:         Mood and Affect: Mood normal.         Behavior: Behavior normal.          ASSESSMENT AND PLAN     1. Osteopenia, unspecified location  - patient to continue vitamin D3 at least 2000 units and Calcium 600-800mg  daily  - patient will continue to try and have outside records sent (DEXA)    2. Chronic midline low back pain with bilateral sciatica  - instructed patient to increase gabapentin (has only been taking 100mg  qhs--gave instructions on titrating up to 300mg  qhs)  - if she ends up not moving to [place], will consider increasing gabapentin further with her vs further therapies (acupuncture, chiro, etc.)    3. Mixed hyperlipidemia  - based on results from outside lipid panel, ASCVD 4.9%  - patient very worried about  numbers and would like to start a statin for secondary prevention  - shared decision making--agree to start statin, low dose  - Atorvastatin (LIPITOR) 10 mg Tablet; Take 1 tablet by mouth every day. (cholesterol)  Dispense: 30 tablet; Refill: 11      Orders Placed This Encounter    Atorvastatin (LIPITOR) 10 mg Tablet     Sig: Take 1 tablet by mouth every day. (cholesterol)     Dispense:  30 tablet     Refill:  11            ------------------  Discussed care and warning signs with patient/caregiver and all questions and concerns were fully answered.    The patient and/or caregiver was educated regarding his/her medical care. No guarantees were made regarding medical care or treatment outcome.    Electronically signed by:        Corlis Hove, DO  Associate Physician  Internal Medicine  [facility], [facility]

## 2018-09-20 NOTE — Patient Instructions (Addendum)
For calcium, make sure you are taking 600-800mg  daily  For vitamin D, make sure you are taking at least 2000 units daily    Increase gabapentin up to 300mg  at night

## 2018-09-20 NOTE — Telephone Encounter (Signed)
Referral Request:    [facility] called to request referral from dr. Needed due to type of insurance      Patient is calling to request new referral to OB/GYN URO/GYNOCOLOGY regarding a problem of [employer] insurance requires auth for patient to be seen at main ob/gyn department. She requests a phone call back and a referral her insurance is [employer].       Please advise      Encarnacion Chu   Thank You      [facility]:  Review if referral already exists. If so, let the patient know the status of the referral. MA:  Has patient been seen for this problem?  If yes, route to provider.  If not, request they make an appointment.]

## 2018-09-21 NOTE — Addendum Note (Signed)
Addended by: Corlis Hove on: 09/21/2018 06:49 AM     Modules accepted: Orders

## 2018-11-06 ENCOUNTER — Telehealth: Payer: Self-pay | Admitting: Internal Medicine

## 2018-11-06 ENCOUNTER — Other Ambulatory Visit: Payer: Self-pay | Admitting: Internal Medicine

## 2018-11-06 DIAGNOSIS — E782 Mixed hyperlipidemia: Secondary | ICD-10-CM

## 2018-11-06 DIAGNOSIS — Z1283 Encounter for screening for malignant neoplasm of skin: Secondary | ICD-10-CM

## 2018-11-06 MED ORDER — ATORVASTATIN 10 MG TABLET
10.0000 mg | ORAL_TABLET | Freq: Every day | ORAL | 1 refills | Status: DC
Start: 2018-11-06 — End: 2019-04-22

## 2018-11-06 NOTE — Telephone Encounter (Signed)
Patient was last seen with PCP on 09/20/18  Last filled 09/20/18  Keenya Matera, MA

## 2018-11-06 NOTE — Telephone Encounter (Signed)
Referral Request:    Patient is calling to request new referral to Dermatology regarding a problem of skin check for cancer. She requests a phone call back and a referral her insurance is [employer].     [[facility]:  Review if referral already exists. If so, let the patient know the status of the referral. MA:  Has patient been seen for this problem?  If yes, route to provider.  If not, request they make an appointment.]

## 2018-11-06 NOTE — Telephone Encounter (Signed)
Referral placed.

## 2018-11-08 NOTE — Telephone Encounter (Signed)
Called and left message for patient to call back regarding message below.    Ilda Laskin,  SR LVN

## 2018-11-16 ENCOUNTER — Ambulatory Visit

## 2018-11-16 DIAGNOSIS — Z23 Encounter for immunization: Secondary | ICD-10-CM

## 2018-11-16 NOTE — Progress Notes (Signed)
The patient has received a flu vaccine previously: yes  The Influenza Vaccine VIS document for the flu injection was given to patient to review. Patient or person named in permission has answered no to any history of egg allergy, previous serious reaction to a influenza vaccine or current illness which would preclude them receiving an immunization via protocol and policy 2041 without additional physician input. Any questions were referred to the physician. The Influenza Vaccine was then administered per protocol or by Policy 2041 using the standing order from Dr. James Kirk (Academic) or Dr. Kurt Slapnik (Network). The patient was observed for 5 minutes for immediate reactions to the vaccine per protocol. No reaction was observed.   Hadley Detloff, LVN

## 2018-11-27 ENCOUNTER — Other Ambulatory Visit: Payer: Self-pay | Admitting: Internal Medicine

## 2018-11-27 DIAGNOSIS — M545 Low back pain, unspecified: Secondary | ICD-10-CM

## 2018-11-27 MED ORDER — GABAPENTIN 100 MG CAPSULE
100.0000 mg | ORAL_CAPSULE | Freq: Every day | ORAL | 1 refills | Status: AC
Start: 2018-11-27 — End: 2019-11-22

## 2018-12-06 ENCOUNTER — Other Ambulatory Visit: Payer: Self-pay | Admitting: Internal Medicine

## 2018-12-06 NOTE — Telephone Encounter (Signed)
General Requests:    Requesting a refill of ESTRACE 0.01 % (0.1 mg/gram) Vaginal Cream [id number] .  The patient states that the As Directed dose is  and the instructions are Insert 1 g into the vagina three times a week..  The patient requests Send to preferred pharmacy.    [[facility]:  Please inform that all medication requests should be made 3 business days in advance.  Advise patient once requested to allow for 48-72 business hours for processing.  Verify that the prescription was not already called in.  Route to MA pool.  MA:  Sort medication history list by date, select the last prescribed version of the medication. Pend refill and route to Provider.  If message is URGENT, MA is to notify Provider face to face about the request].

## 2018-12-07 ENCOUNTER — Other Ambulatory Visit: Payer: Self-pay | Admitting: Internal Medicine

## 2018-12-07 DIAGNOSIS — M7662 Achilles tendinitis, left leg: Secondary | ICD-10-CM

## 2018-12-07 DIAGNOSIS — M7752 Other enthesopathy of left foot: Secondary | ICD-10-CM

## 2018-12-07 DIAGNOSIS — M76812 Anterior tibial syndrome, left leg: Secondary | ICD-10-CM

## 2018-12-07 NOTE — Telephone Encounter (Signed)
Received refill request from pharmacy.    Last office visit with Primary Care Physician Gladis Riffle, DO 09/20/18.    Prescription for Celecoxib (CELEBREX) 200 mg Capsule  last filled 08/14/18 #30 refills 1.    Charlane Ferretti, MA

## 2018-12-07 NOTE — Telephone Encounter (Signed)
Last seen in clinic 09/20/18  Last refill of medication 10/12/17

## 2018-12-08 MED ORDER — ESTRACE 0.01% (0.1 MG/GRAM) VAGINAL CREAM
TOPICAL_CREAM | VAGINAL | 3 refills | Status: AC
Start: 2018-12-08 — End: 2019-12-07

## 2018-12-10 MED ORDER — CELECOXIB 200 MG CAPSULE
200.0000 mg | ORAL_CAPSULE | Freq: Every day | ORAL | 1 refills | Status: AC
Start: 2018-12-10 — End: 2019-06-08

## 2018-12-24 ENCOUNTER — Ambulatory Visit: Admitting: OBSTETRICS/GYN

## 2019-03-26 ENCOUNTER — Other Ambulatory Visit: Payer: Self-pay | Admitting: Family Medicine

## 2019-04-19 ENCOUNTER — Other Ambulatory Visit: Payer: Self-pay | Admitting: Internal Medicine

## 2019-04-19 DIAGNOSIS — E782 Mixed hyperlipidemia: Secondary | ICD-10-CM

## 2019-05-14 ENCOUNTER — Telehealth: Payer: Self-pay

## 2019-05-14 NOTE — Telephone Encounter (Signed)
Patient declined Covid Vaccine due to already vaccinated.

## 2019-10-17 ENCOUNTER — Encounter (HOSPITAL_BASED_OUTPATIENT_CLINIC_OR_DEPARTMENT_OTHER): Payer: Self-pay | Admitting: Internal Medicine

## 2019-10-19 ENCOUNTER — Other Ambulatory Visit: Payer: Self-pay | Admitting: Internal Medicine

## 2019-10-19 DIAGNOSIS — E782 Mixed hyperlipidemia: Secondary | ICD-10-CM

## 2019-10-21 NOTE — Telephone Encounter (Signed)
Last seen by  primary care physician 09/20/2018

## 2019-10-21 NOTE — Telephone Encounter (Signed)
Due for yearly exam.  Please call and schedule.

## 2019-10-22 NOTE — Telephone Encounter (Signed)
Three identifiers used. Relayed the message below.   Patient states she moved to [place]. Dr Norlene Duel taken off as patients PCP.

## 2019-11-10 ENCOUNTER — Other Ambulatory Visit: Payer: Self-pay | Admitting: Internal Medicine

## 2020-01-16 ENCOUNTER — Other Ambulatory Visit: Payer: Self-pay | Admitting: Internal Medicine

## 2020-01-16 DIAGNOSIS — E782 Mixed hyperlipidemia: Secondary | ICD-10-CM

## 2021-06-24 IMAGING — MR MRI ABDOMEN W/WO CONTRAST
15 of 21 series · 29 of 48 positions shown · IV contrast (gadolinium)
Comparison: None available.

________________________________________________________________________________________________ 
MRI ABDOMEN W/WO CONTRAST, 06/24/2021 [DATE]: 
CLINICAL INDICATION: Liver lesion reportedly found on sonography.
TECHNIQUE: Multiplanar, multiecho position MR images of the abdomen attention 
liver were performed without and with intravenous gadolinium enhancement.
mL of Gadavist were injected intravenously. 0 mL of Gadavist was discarded.  
Patient was scanned on a 1.5T magnet. .

[Series 101: survey-head 1st · axial · 15.0mm · 1.76mm/px · 1 of 15 slices shown]
[im 1/15]
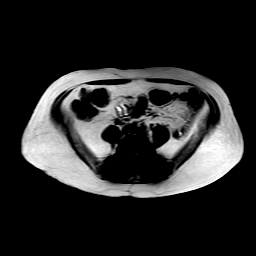

[Series 301: T2 · coronal · 5.0mm · 0.88mm/px · 1 of 32 slices shown]
[im 1/32]
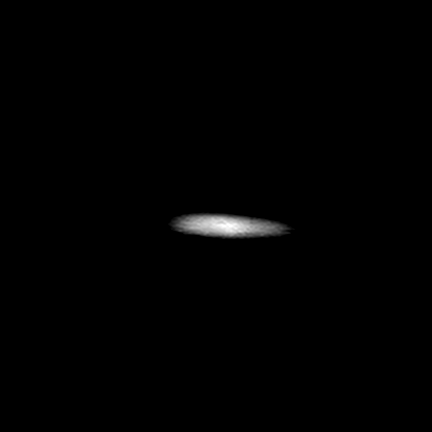

[Series 402: sout of phase · axial · 6.0mm · 1.14mm/px · 1 of 36 slices shown]
[im 1/36]
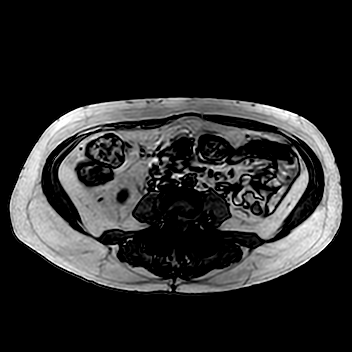

[Series 403: sin phase · axial · 6.0mm · 1.14mm/px · 1 of 36 slices shown]
[im 1/36]
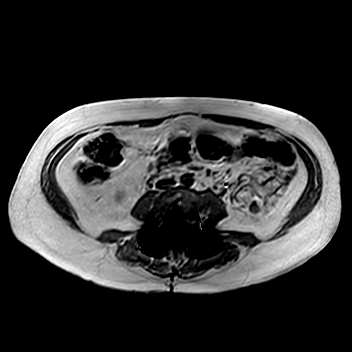

[Series 501: t2_ax_mvxd_hr_rt · axial · 5.0mm · 0.77mm/px · 1 of 40 slices shown]
[im 1/40]
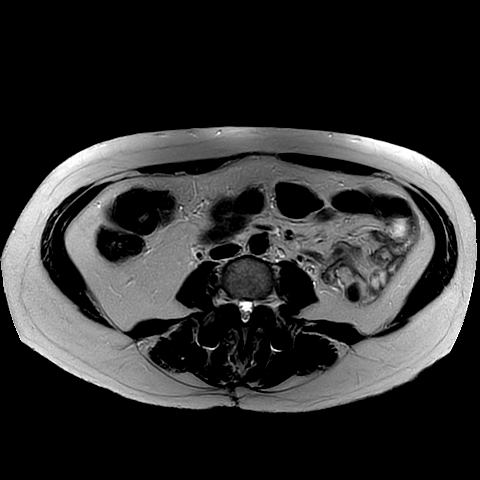

[Series 601: t2_spair mvxd_rt_fast · axial · 5.0mm · 0.86mm/px · 1 of 40 slices shown]
[im 1/40]
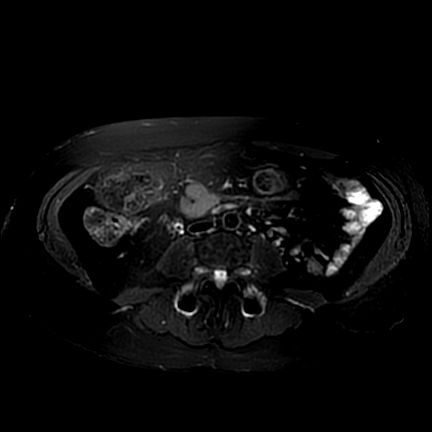

[Series 702: sbo · axial · 5.0mm · 1.67mm/px · 1 of 44 slices shown]
[im 1/44]
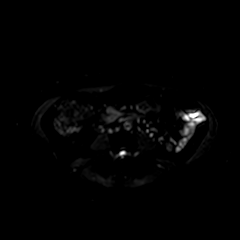

[Series 703: (id) · axial · 5.0mm · 1.67mm/px · 1 of 44 slices shown]
[im 1/44]
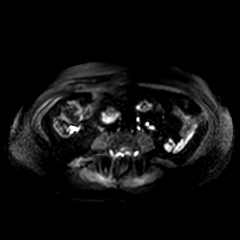

[Series 704: dadc 600 · axial · 5.0mm · 1.67mm/px · 1 of 44 slices shown]
[im 1/44]
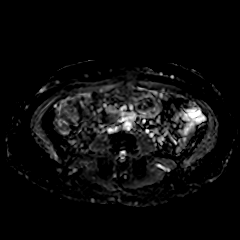

[Series 802: DIXON · axial · 4.0mm · 0.89mm/px · z∈[-11,+227]mm · 3 of 120 slices shown (1 of 5)]
[im 1/120]
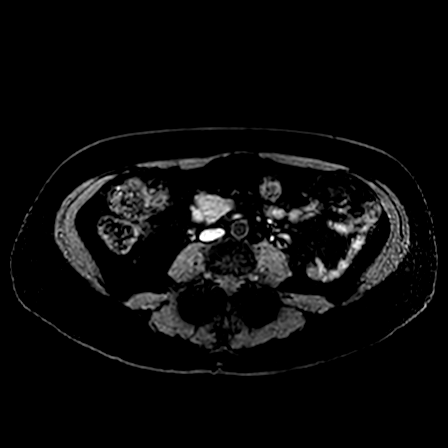
[im 60/120]
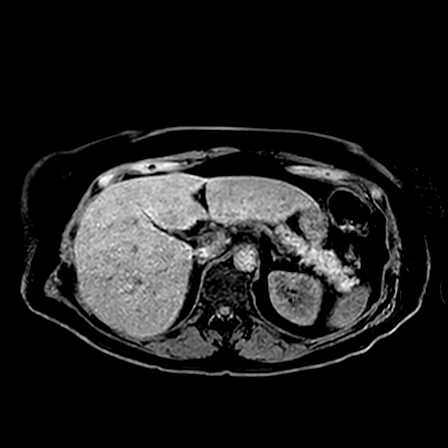
[im 120/120]
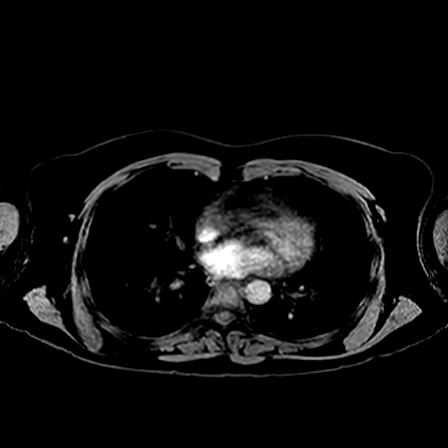

[Series 803: DIXON · axial · 4.0mm · 0.89mm/px · z∈[-11,+227]mm · 3 of 120 slices shown (2 of 5)]
[im 1/120]
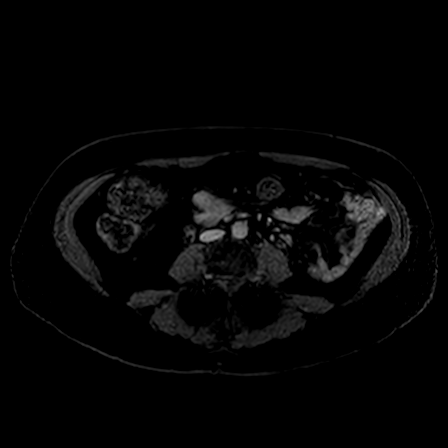
[im 60/120]
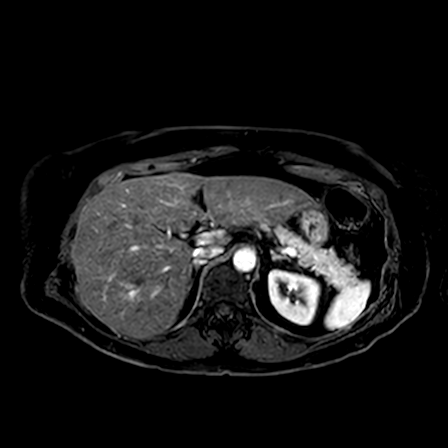
[im 120/120]
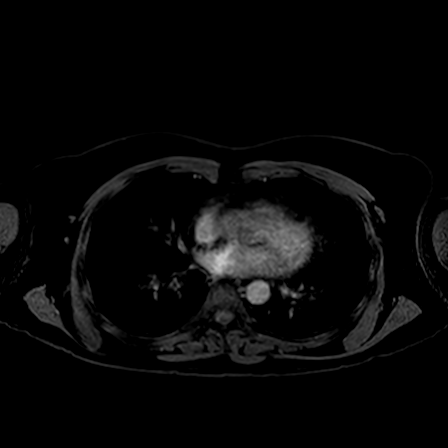

[Series 804: DIXON · axial · 4.0mm · 0.89mm/px · z∈[-11,+227]mm · 3 of 120 slices shown (3 of 5)]
[im 1/120]
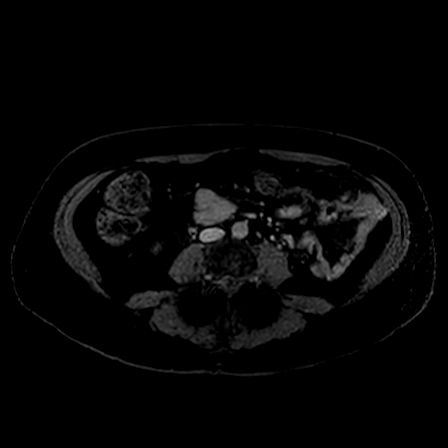
[im 60/120]
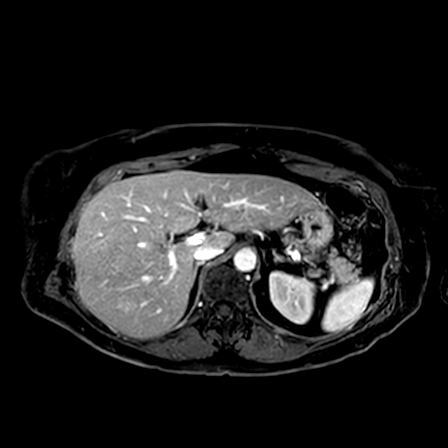
[im 120/120]
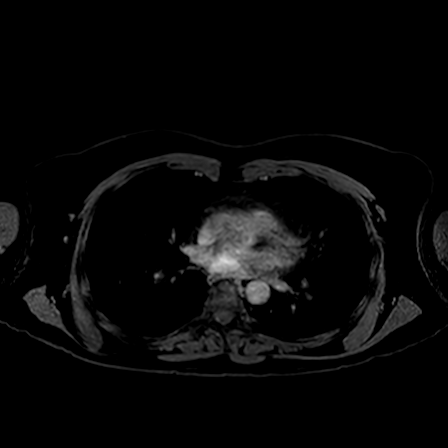

[Series 805: DIXON · axial · 4.0mm · 0.89mm/px · z∈[-11,+227]mm · 4 of 120 slices shown (4 of 5)]
[im 1/120]
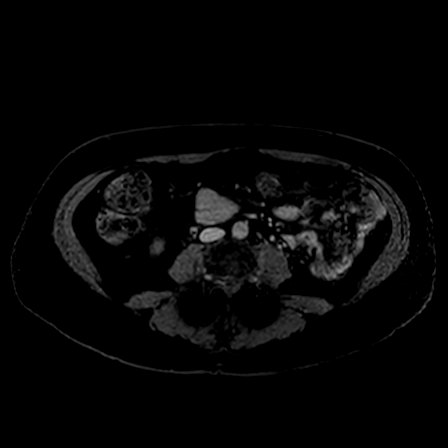
[im 40/120]
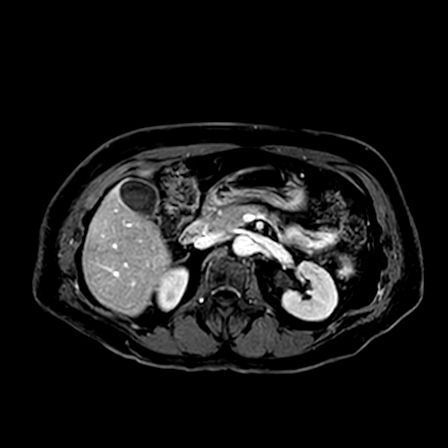
[im 80/120]
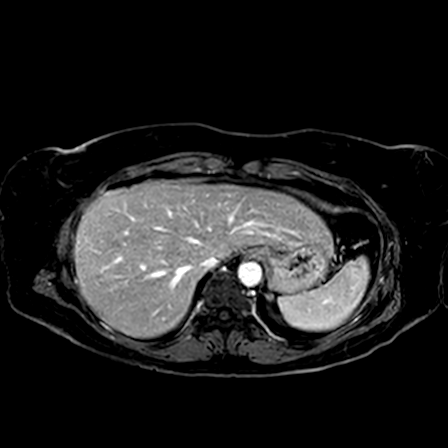
[im 120/120]
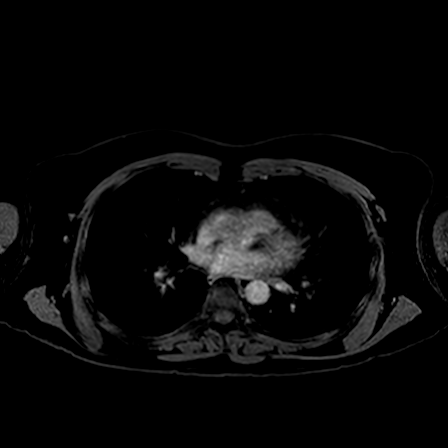

[Series 806: DIXON · axial · 4.0mm · 0.89mm/px · z∈[-11,+227]mm · 4 of 120 slices shown (5 of 5)]
[im 1/120]
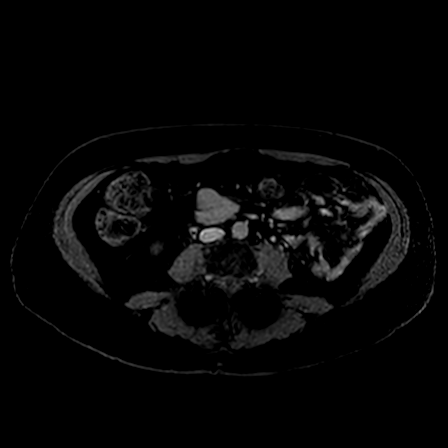
[im 40/120]
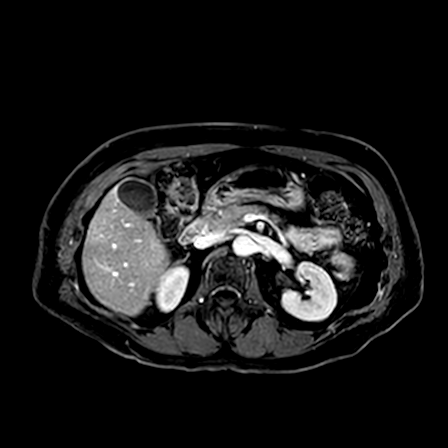
[im 80/120]
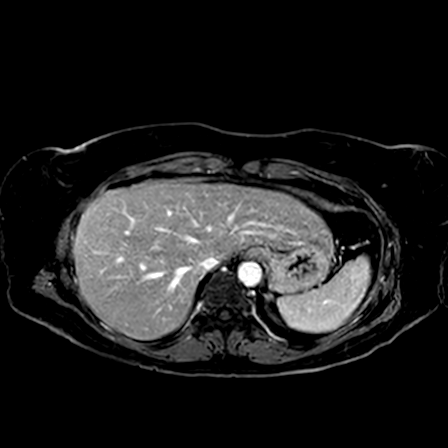
[im 120/120]
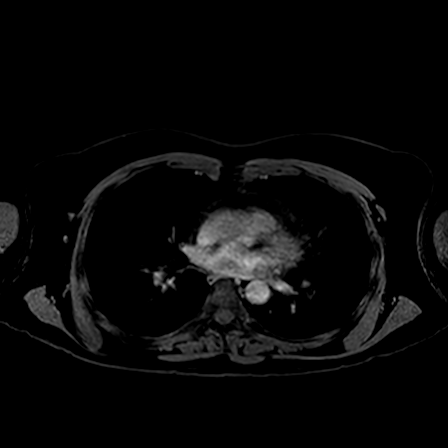

[Series 807: DIXON post-contrast · axial · 4.0mm · 0.89mm/px · z∈[-11,+147]mm · 3 of 120 slices shown]
[im 1/120]
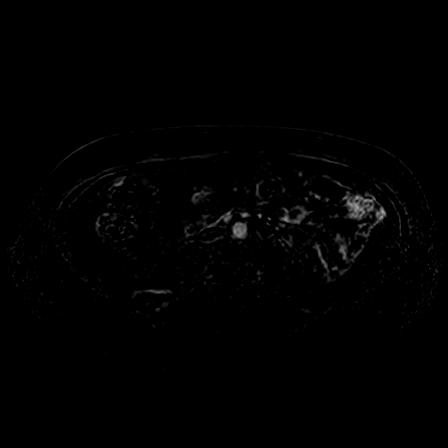
[im 40/120]
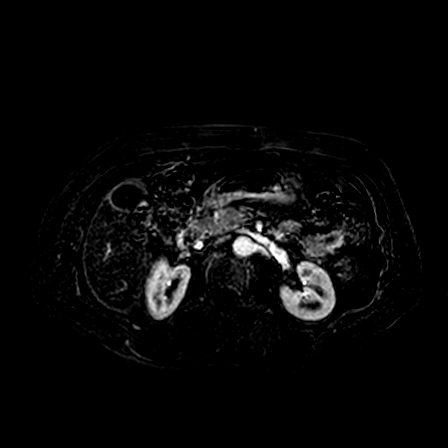
[im 80/120]
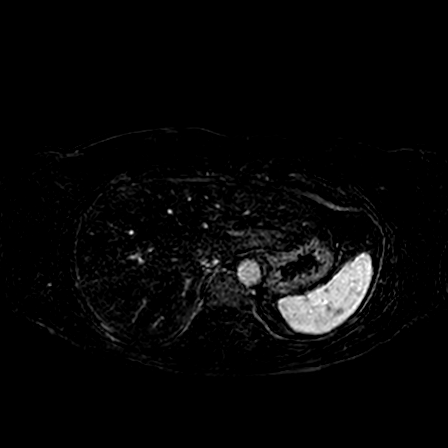

[29 of 48 positions shown; findings below may reference images not displayed]

FINDINGS: There is pronounced decreased in hepatic signal on out of phase T1 sequences 
indicating hepatic steatosis. There is a approximately 5 mm T2 hyperintense 
lesion in hepatic segment 6 as seen on axial image 21 of series 601 and image 55 
of series 8008. It exhibits enhancement to the same degree is adjacent blood 
vessels within the liver and would be consistent with either a small flash 
filling hemangioma or a portal venous to hepatic venous shunt. T2 sequences show 
2 additional subcentimeter cysts in the liver one of which is in hepatic segment 
8 on axial image 25 of series 501 and the other in hepatic segment 7 on axial 
image 28 of series 501. The liver is otherwise unremarkable. Hepatic and portal 
veins are patent. 
The biliary tract, pancreas, spleen, adrenal glands, and kidneys are 
unremarkable. The gallbladder potentially contains a 3 mm polyp and is otherwise 
unremarkable. Incidentally imaged portions of the GI tract are unremarkable. 
Abdominal aorta is normal in caliber. Degenerative and fusion changes are in the 
lumbar spine
IMPRESSION: 1. There are tiny lesions in the liver one of which appears to likely represent 
a flash filling hemangioma or small portal venous to hepatic venous shunt with 2 
additional lesions likely representing tiny cysts. Please note that we did not 
have the prior sonogram report or comparison images. 
2. There is evidence of diffuse hepatic steatosis.  
3. Potential 3 mm gallbladder polyp.

## 2021-10-07 IMAGING — MR MRI LUMBAR SPINE WITHOUT CONTRAST
6 series · 48 of 48 positions shown · non-contrast
Comparison: none

﻿

MAGNETIC RESONANCE IMAGING OF THE LUMBAR SPINE WITHOUT INTRAVENOUS CONTRAST ADMINISTRATION
HISTORY: Vertebrogenic low back pain.  History of prior surgery.
TECHNIQUE: Magnetic resonance imaging of the lumbar spine was accomplished employing a surface coil and an open 0[employer] scanner without intravenous contrast administration.  T1 and T2-weighted thin slice sections were obtained.

[Series 1: s-c scano · coronal · 6.0mm · 1.17mm/px · 5 of 11 slices shown]
[im 1/11]
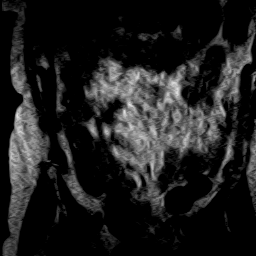
[im 3/11]
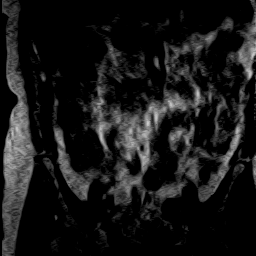
[im 6/11]
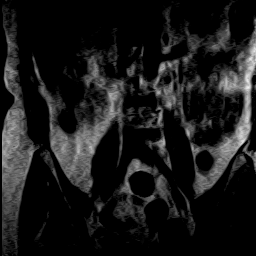
[im 8/11]
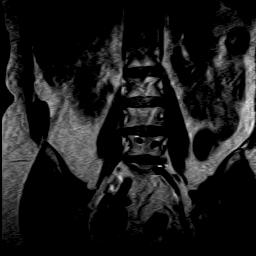
[im 11/11]
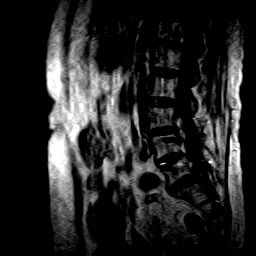

[Series 2: T2 · sagittal · 5.0mm · 1.13mm/px · 5 of 11 slices shown (1 of 2)]
[im 1/11]
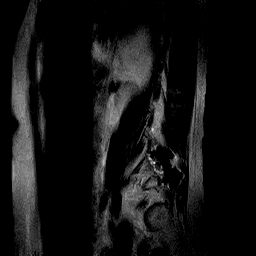
[im 3/11]
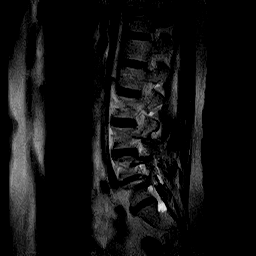
[im 6/11]
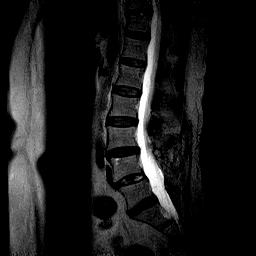
[im 8/11]
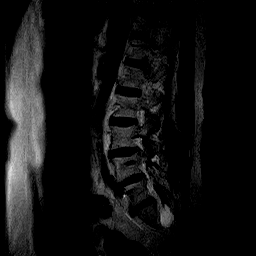
[im 11/11]
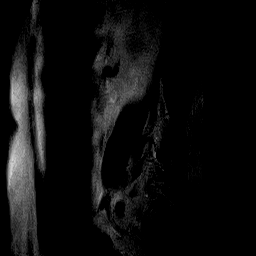

[Series 3: T1 · sagittal · 5.0mm · 1.13mm/px · 5 of 11 slices shown (1 of 2)]
[im 1/11]
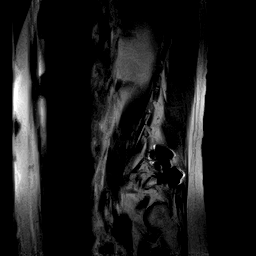
[im 3/11]
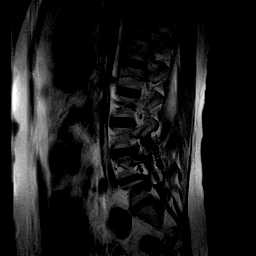
[im 6/11]
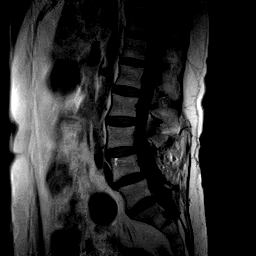
[im 8/11]
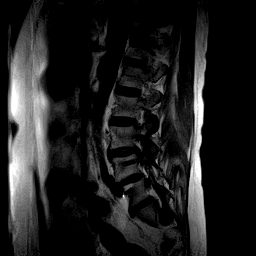
[im 11/11]
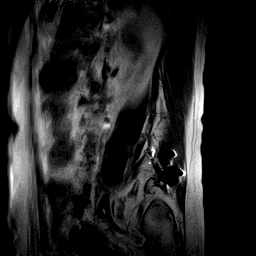

[Series 4: T2 · axial · 4.0mm · 1.09mm/px · z∈[-97,+92]mm · 14 of 30 slices shown (2 of 2)]
[im 1/30]
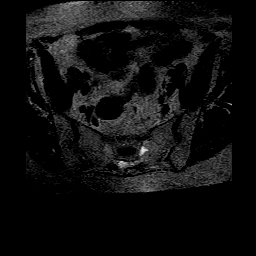
[im 3/30]
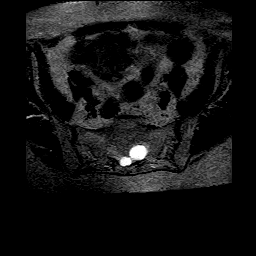
[im 5/30]
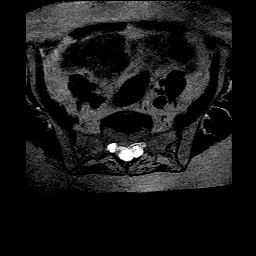
[im 7/30]
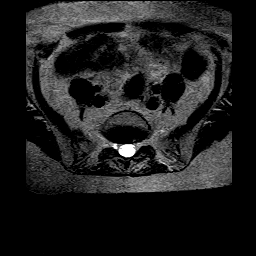
[im 9/30]
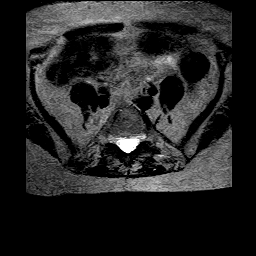
[im 12/30]
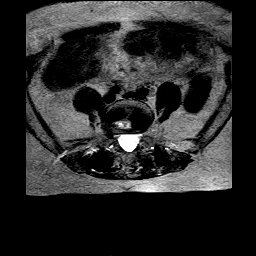
[im 14/30]
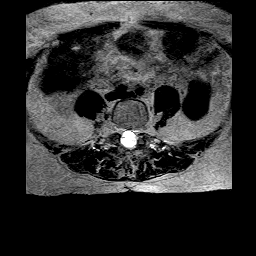
[im 16/30]
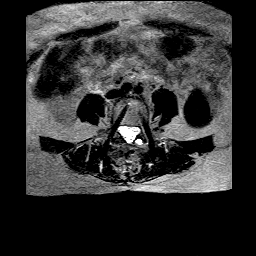
[im 18/30]
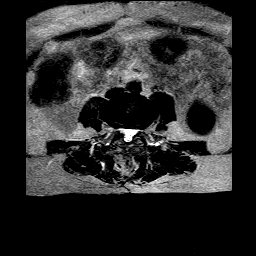
[im 21/30]
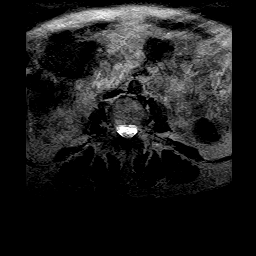
[im 23/30]
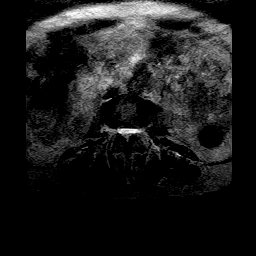
[im 25/30]
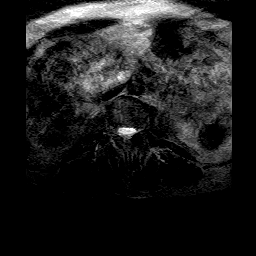
[im 27/30]
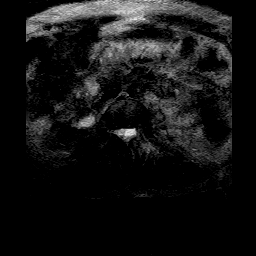
[im 30/30]
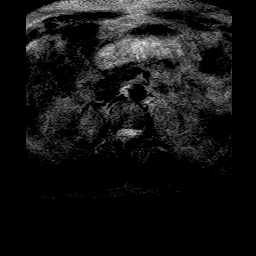

[Series 5: sag fir · sagittal · 5.0mm · 1.13mm/px · 5 of 11 slices shown]
[im 1/11]
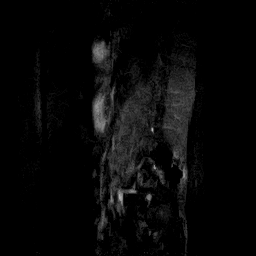
[im 3/11]
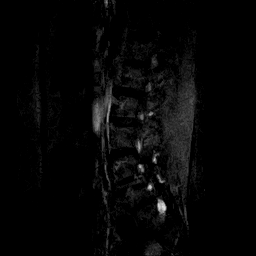
[im 6/11]
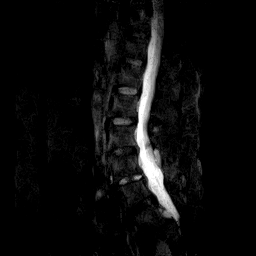
[im 8/11]
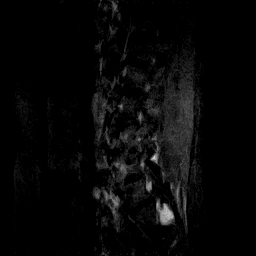
[im 11/11]
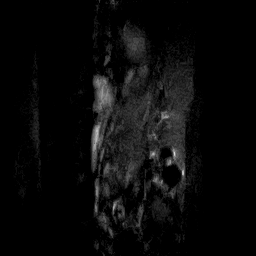

[Series 6: T1 · axial · 4.0mm · 1.09mm/px · z∈[-97,+92]mm · 14 of 30 slices shown (2 of 2)]
[im 1/30]
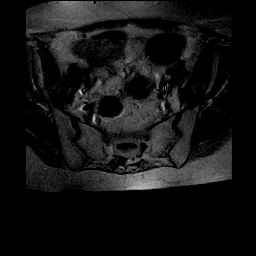
[im 3/30]
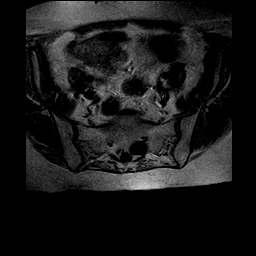
[im 5/30]
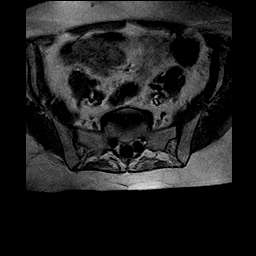
[im 7/30]
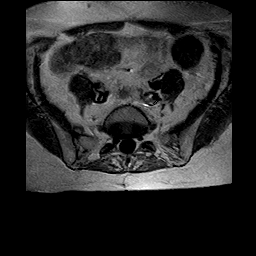
[im 9/30]
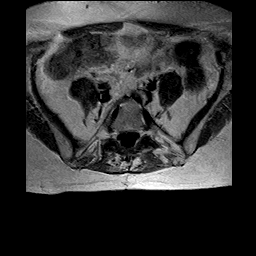
[im 12/30]
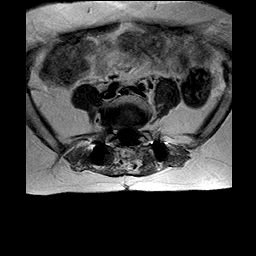
[im 14/30]
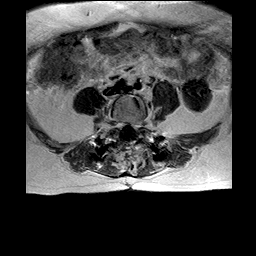
[im 16/30]
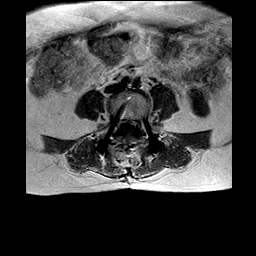
[im 18/30]
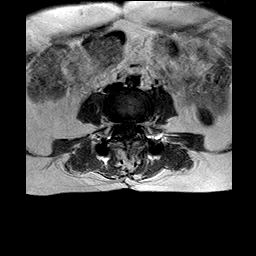
[im 21/30]
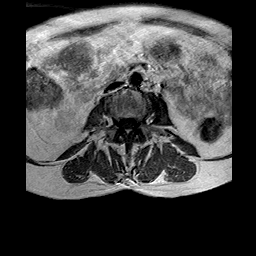
[im 23/30]
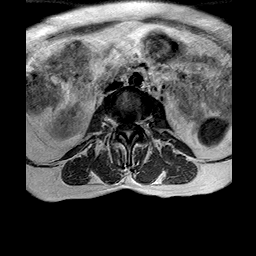
[im 25/30]
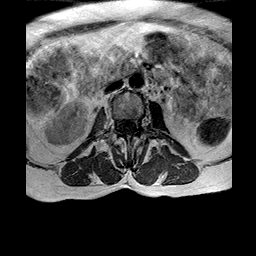
[im 27/30]
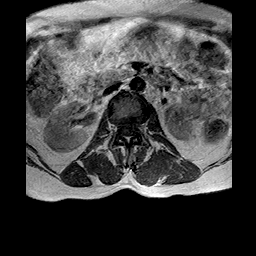
[im 30/30]
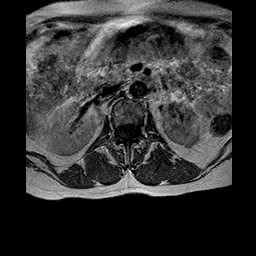

[48 of 48 positions shown; findings below may reference images not displayed]

FINDINGS: Comparison made to prior MRI lumbar spine evaluation dated 12/02/2015 from [facility] 

In the interval since prior MRI evaluation, patient has undergone metallic pedicular screw and rod spinal fusion L4 to L5 vertebral positions with apparent discectomy at this interspace.  Dorsal laminectomy extending from L3 to S1.  Minor localized magnetic field disruption/metallic artifact.  L4 anterolisthesis appears improved in comparison to prior study, currently estimated at 3 mm.  Persistent mild levoscoliosis.  Lumbar vertebrae appear normal in height.  No significant regional bone marrow signal abnormalities.  Incidental vertebral hemangioma.  Sacral Tarlov and perineural cysts again observed.  

The conus appears normal.  No focal abnormalities of included paraspinal soft tissues identified.  

Moderate inferior thoracic and lumbar degenerative disc disease again noted diffusely with disc desiccation and disc space narrowing, extradural spurring, and minor facet joint and ligamentous overgrowth. 

Early degenerative changes L1-L2 interspace without canal or foraminal compromise. 

New mild broad-based annular bulging L2-L3 interspace without canal or foraminal compromise. 

Examination of the L3-L4 interspace demonstrates new postsurgical changes dorsally with decreased prominent of broad-based disc bulging/small central protrusion ventrally.  Minor residual lateral recess narrowing without compromise.  Resolution of significant central spinal canal stenosis.  No evidence for foraminal compromise.  

Examination of the L4-L5 interspace demonstrates postoperative changes with resolution of significant central spinal canal and lateral recess stenoses.  Neural foramina widely patent.  

Stable degenerative changes and mild annular bulging L5-S1 interspace.  Stable perineural cystic change.
IMPRESSION: 1. Postsurgical changes as described with resolution of significant central spinal canal and lateral recess stenoses at L4-L5 and L3-L4 interspaces.  

2. New disc bulging L2-L3 interspace without canal or foraminal compromise. 

3. No evidence for significant new focal nuclear herniation.

4. Please see in-depth discussion in body of report.
